# Patient Record
Sex: Female | Born: 1950 | Race: Black or African American | Hispanic: No | Marital: Married | Smoking: Never smoker
Health system: Southern US, Community
[De-identification: ages and names within clinical notes are randomized; demographics above are authoritative.]

## PROBLEM LIST (undated history)

## (undated) DIAGNOSIS — Z973 Presence of spectacles and contact lenses: Secondary | ICD-10-CM

## (undated) DIAGNOSIS — N201 Calculus of ureter: Secondary | ICD-10-CM

## (undated) DIAGNOSIS — I10 Essential (primary) hypertension: Secondary | ICD-10-CM

## (undated) DIAGNOSIS — G43909 Migraine, unspecified, not intractable, without status migrainosus: Secondary | ICD-10-CM

## (undated) DIAGNOSIS — T7840XA Allergy, unspecified, initial encounter: Secondary | ICD-10-CM

## (undated) DIAGNOSIS — D0510 Intraductal carcinoma in situ of unspecified breast: Secondary | ICD-10-CM

## (undated) DIAGNOSIS — N200 Calculus of kidney: Secondary | ICD-10-CM

## (undated) DIAGNOSIS — D649 Anemia, unspecified: Secondary | ICD-10-CM

## (undated) DIAGNOSIS — Z8619 Personal history of other infectious and parasitic diseases: Secondary | ICD-10-CM

## (undated) HISTORY — PX: TUBAL LIGATION: SHX77

## (undated) HISTORY — DX: Essential (primary) hypertension: I10

## (undated) HISTORY — DX: Intraductal carcinoma in situ of unspecified breast: D05.10

## (undated) HISTORY — PX: ORIF FEMUR FRACTURE: SHX2119

## (undated) HISTORY — DX: Migraine, unspecified, not intractable, without status migrainosus: G43.909

## (undated) HISTORY — DX: Calculus of kidney: N20.0

## (undated) HISTORY — DX: Presence of spectacles and contact lenses: Z97.3

## (undated) HISTORY — DX: Anemia, unspecified: D64.9

## (undated) HISTORY — DX: Allergy, unspecified, initial encounter: T78.40XA

## (undated) HISTORY — PX: TONSILLECTOMY: SUR1361

---

## 1998-05-31 HISTORY — PX: BUNIONECTOMY: SHX129

## 2002-05-22 ENCOUNTER — Encounter: Payer: Self-pay | Admitting: Emergency Medicine

## 2002-05-22 ENCOUNTER — Emergency Department (HOSPITAL_COMMUNITY): Admission: EM | Admit: 2002-05-22 | Discharge: 2002-05-22 | Payer: Self-pay | Admitting: Emergency Medicine

## 2004-12-30 ENCOUNTER — Other Ambulatory Visit: Admission: RE | Admit: 2004-12-30 | Discharge: 2004-12-30 | Payer: Self-pay | Admitting: Obstetrics and Gynecology

## 2005-01-20 ENCOUNTER — Ambulatory Visit: Payer: Self-pay | Admitting: Internal Medicine

## 2005-01-25 ENCOUNTER — Ambulatory Visit: Payer: Self-pay | Admitting: Internal Medicine

## 2005-02-18 ENCOUNTER — Ambulatory Visit: Payer: Self-pay | Admitting: Internal Medicine

## 2005-02-22 ENCOUNTER — Ambulatory Visit: Payer: Self-pay | Admitting: Internal Medicine

## 2005-02-24 ENCOUNTER — Ambulatory Visit: Payer: Self-pay | Admitting: Internal Medicine

## 2013-06-15 ENCOUNTER — Other Ambulatory Visit: Payer: Self-pay | Admitting: Medical

## 2013-06-15 ENCOUNTER — Telehealth: Payer: Self-pay | Admitting: Medical

## 2013-06-15 ENCOUNTER — Ambulatory Visit (INDEPENDENT_AMBULATORY_CARE_PROVIDER_SITE_OTHER): Payer: No Typology Code available for payment source | Admitting: Medical

## 2013-06-15 ENCOUNTER — Encounter: Payer: Self-pay | Admitting: Medical

## 2013-06-15 VITALS — BP 140/78 | HR 78 | Temp 97.7°F | Resp 16 | Ht 62.5 in | Wt 113.0 lb

## 2013-06-15 DIAGNOSIS — Z1239 Encounter for other screening for malignant neoplasm of breast: Secondary | ICD-10-CM

## 2013-06-15 DIAGNOSIS — R030 Elevated blood-pressure reading, without diagnosis of hypertension: Secondary | ICD-10-CM

## 2013-06-15 DIAGNOSIS — Z13820 Encounter for screening for osteoporosis: Secondary | ICD-10-CM

## 2013-06-15 DIAGNOSIS — Z23 Encounter for immunization: Secondary | ICD-10-CM

## 2013-06-15 DIAGNOSIS — Z1231 Encounter for screening mammogram for malignant neoplasm of breast: Secondary | ICD-10-CM

## 2013-06-15 DIAGNOSIS — Z1211 Encounter for screening for malignant neoplasm of colon: Secondary | ICD-10-CM

## 2013-06-15 DIAGNOSIS — Z0000 Encounter for general adult medical examination without abnormal findings: Secondary | ICD-10-CM

## 2013-06-15 LAB — POCT URINALYSIS DIPSTICK
Bilirubin, UA: NEGATIVE
Blood, UA: NEGATIVE
Glucose, UA: NEGATIVE
Ketones, UA: NEGATIVE
Leukocytes, UA: NEGATIVE
Nitrite, UA: NEGATIVE
Protein, UA: NEGATIVE
Spec Grav, UA: <=1.005
Urobilinogen, UA: NEGATIVE
pH, UA: 7

## 2013-06-15 LAB — CBC
HCT: 40.5 % (ref 36.0–46.0)
Hemoglobin: 14 g/dL (ref 12.0–15.0)
MCH: 30.7 pg (ref 26.0–34.0)
MCHC: 34.6 g/dL (ref 30.0–36.0)
MCV: 88.8 fL (ref 78.0–100.0)
Platelets: 273 10*3/uL (ref 150–400)
RBC: 4.56 MIL/uL (ref 3.87–5.11)
RDW: 13 % (ref 11.5–15.5)
WBC: 7.6 10*3/uL (ref 4.0–10.5)

## 2013-06-15 MED ORDER — ATORVASTATIN CALCIUM 40 MG PO TABS: 40.0000 mg | ORAL_TABLET | Freq: Every day | ORAL | Status: DC

## 2013-06-15 NOTE — Progress Notes (Signed)
Subjective:   HPI  Alison Zimmerman is a 63 y.o. female who presents for a complete physical.  Was formerly a patient here 10 years ago.   Hasn't had any recent preventative care.     Preventative care: Last ophthalmology= [facility]- NOT SEEN THERE IN A WHILE Last dental visit:YES DR. Terence Lux Last colonoscopy:NEVER Last mammogram:NOT IN YEARS SINCE MAYBE 2000 Last gynecological exam:YEARS AGO Last EKG:? Last labs:?  Prior vaccinations: TD or Tdap: MAYBE 1999 Influenza: no Pneumococcal:N/A Shingles/Zostavax:N/A Other: NO  Advanced directive:N/A Health care power of attorney:N/A Living will:N/A  Concerns:NONE   Reviewed their medical, surgical, family, social, medication, and allergy history and updated chart as appropriate.  Past Medical History  Diagnosis Date  . Skin complaints     sees Dr. Allyson Sabal  . History of kidney stones     Dr. Gaynelle Arabian, [facility]  . Wears glasses   . Migraine   . H/O bone density study     remote past  . History of mammogram     remote past    Past Surgical History  Procedure Laterality Date  . Tubal ligation    . Bunionectomy  2000    Dr. Gwyneth Revels, right  . Colonoscopy  1/15    never    History   Social History  . Marital Status: Married    Spouse Name: N/A    Number of Children: N/A  . Years of Education: N/A   Occupational History  . Not on file.   Social History Main Topics  . Smoking status: Never Smoker   . Smokeless tobacco: Not on file  . Alcohol Use: No  . Drug Use: No  . Sexual Activity: Not on file   Other Topics Concern  . Not on file   Social History Narrative   Married, has 2 daughters, 3 children.  Exercise - low impact water aerobics, retired, use to work for [employer];      Family History  Problem Relation Age of Onset  . Stroke Mother   . Stroke Father   . Cancer Neg Hx   . Diabetes Neg Hx   . Heart disease Neg Hx   . Hypertension Neg Hx     No current outpatient prescriptions on  file.  No Known Allergies    Review of Systems Constitutional: -fever, -chills, -sweats, -unexpected weight change, -decreased appetite, -fatigue Allergy: -sneezing, -itching, -congestion Dermatology: -changing moles, --rash, -lumps ENT: -runny nose, -ear pain, -sore throat, -hoarseness, -sinus pain, -teeth pain, - ringing in ears, -hearing loss, -nosebleeds Cardiology: -chest pain, -palpitations, -swelling, -difficulty breathing when lying flat, -waking up short of breath Respiratory: -cough, -shortness of breath, -difficulty breathing with exercise or exertion, -wheezing, -coughing up blood Gastroenterology: -abdominal pain, -nausea, -vomiting, -diarrhea, -constipation, -blood in stool, -changes in bowel movement, -difficulty swallowing or eating Hematology: -bleeding, -bruising  Musculoskeletal: -joint aches, -muscle aches, -joint swelling, -back pain, -neck pain, -cramping, -changes in gait Ophthalmology: denies vision changes, eye redness, itching, discharge Urology: -burning with urination, -difficulty urinating, -blood in urine, -urinary frequency, -urgency, -incontinence Neurology: -headache, -weakness, -tingling, -numbness, -memory loss, -falls, -dizziness Psychology: -depressed mood, -agitation, -sleep problems     Objective:   Physical Exam  BP 140/78  Pulse 78  Temp(Src) 97.7 F (36.5 C) (Oral)  Resp 16  Ht 5' 2.5" (1.588 m)  Wt 113 lb (51.256 kg)  BMI 20.33 kg/m2  General appearance: alert, no distress, WD/WN, lean AA female Skin: few scattered benign flat lesions, no worrisome lesions,  right jaw with unchanged benign 31mm round raised papule, small 59mm round papule of right nose distallly, benign HEENT: normocephalic, conjunctiva/corneas normal, sclerae anicteric, PERRLA, EOMi, nares patent, no discharge or erythema, pharynx normal Oral cavity: MMM, tongue normal, teeth in good repair Neck: supple, no lymphadenopathy, no thyromegaly, no masses, normal ROM, no  bruits Chest: non tender, normal shape and expansion Heart: RRR, normal S1, S2, no murmurs Lungs: CTA bilaterally, no wheezes, rhonchi, or rales Abdomen: +bs, soft, non tender, non distended, no masses, no hepatomegaly, no splenomegaly, no bruits Back: non tender, normal ROM, no scoliosis Musculoskeletal: upper extremities non tender, no obvious deformity, normal ROM throughout, lower extremities non tender, no obvious deformity, normal ROM throughout Extremities: no edema, no cyanosis, no clubbing Pulses: 2+ symmetric, upper and lower extremities, normal cap refill Neurological: alert, oriented x 3, CN2-12 intact, strength normal upper extremities and lower extremities, sensation normal throughout, DTRs 2+ throughout, no cerebellar signs, gait normal Psychiatric: normal affect, behavior normal, pleasant  Breast/gyn/rectal - deferred today, will return for this   Assessment and Plan :    Encounter Diagnoses  Name Primary?  . Routine general medical examination at a health care facility Yes  . Need for Tdap vaccination   . Need for shingles vaccine   . Special screening for malignant neoplasms, colon   . Screening for breast cancer   . Elevated blood pressure reading without diagnosis of hypertension   . Screening for osteoporosis     Physical exam - discussed healthy lifestyle, diet, exercise, preventative care, vaccinations, and addressed their concerns.  Handout given. Counseled on the Tdap (tetanus, diptheria, and acellular pertussis) vaccine.  Vaccine information sheet given. Tdap vaccine given after consent obtained.  Patient recommendations printed as below: Please check your insurance coverage for the shingles/Zostavax vaccine as well as the pneumococcal vaccine  We are referring you to gastroenterology for your first screening colonoscopy   Please return at your convenience for breast and pelvic exam, Pap smear.   We are referring you for a screening mammogram.  We  updated your Tdap vaccine today.  Make sure you are getting 1200 mg of calcium daily through supplements or dairy products over-the-counter, 600 international units of vitamin D daily.  I recommend you take aspirin 81 mg at bedtime for heart disease/stroke prevention given your family history of stroke.  Continue routine exercise.  See your eye doctor and dentist yearly.  She will either bring back her recent insurance labs today or let us know so we can send labs.   Follow-up pending studies.

## 2013-06-15 NOTE — Patient Instructions (Signed)
Please check your insurance coverage for the shingles/Zostavax vaccine as well as the pneumococcal vaccine  We are referring you to gastroenterology for your first screening colonoscopy   Please return at your convenience for breast and pelvic exam, Pap smear.   We are referring you for a screening mammogram.  We updated your Tdap vaccine today.  Make sure you are getting 1200 mg of calcium daily through supplements or dairy products over-the-counter, 600 international units of vitamin D daily.  I recommend you take aspirin 81 mg at bedtime for heart disease/stroke prevention given your family history of stroke.  Continue routine exercise.  See your eye doctor and dentist yearly.   Local eye doctors:  Dr. Webb Laws [place], [place], [place] [address] [phone]   [facility] Dr. Camillo Flaming [address] [place], [place] [address]  [email or web address]   Fabio Pierce, M.D. Corena Herter, O.D. [place], [place], [place] [address] Medical telephone: [phone] Optical telephone: [phone]

## 2013-06-15 NOTE — Addendum Note (Signed)
Addended by: Carlena Hurl on: 06/15/2013 01:23 PM   Modules accepted: Orders

## 2013-06-15 NOTE — Telephone Encounter (Signed)
Please tell her thank you for bringing the lab results by. I will send some additional bloodwork out for a blood count and vitamin D test.  Her labs were okay except for her cholesterol which is quite high. Her LDL bad cholesterol and total cholesterol were quite high, so I would like her to begin Lipitor for cholesterol at bedtime once daily along with 81 mg baby aspirin daily for heart health and to lower cholesterol.  Since she already eats healthy and exercises, is a normal weight, that is why her recommend we go ahead and start the medication.  Plan to have her come back in 3 months to recheck on cholesterol.

## 2013-06-15 NOTE — Telephone Encounter (Signed)
Patient is aware of Dorothea Ogle PA_c message and recommendations. CLS

## 2013-06-16 LAB — VITAMIN D 25 HYDROXY (VIT D DEFICIENCY, FRACTURES): Vit D, 25-Hydroxy: 28 ng/mL — ABNORMAL LOW (ref 30–89)

## 2013-06-18 ENCOUNTER — Other Ambulatory Visit: Payer: Self-pay | Admitting: Medical

## 2013-06-18 MED ORDER — VITAMIN D (ERGOCALCIFEROL) 1.25 MG (50000 UNIT) PO CAPS: 50000.0000 [IU] | ORAL_CAPSULE | ORAL | Status: DC

## 2013-06-29 ENCOUNTER — Encounter: Payer: Self-pay | Admitting: Medical

## 2013-07-04 ENCOUNTER — Ambulatory Visit
Admission: RE | Admit: 2013-07-04 | Discharge: 2013-07-04 | Disposition: A | Discharge status: Home / Self Care | Payer: No Typology Code available for payment source | Source: Ambulatory Visit | Attending: Medical | Admitting: Medical

## 2013-07-04 DIAGNOSIS — Z1231 Encounter for screening mammogram for malignant neoplasm of breast: Secondary | ICD-10-CM

## 2013-07-06 ENCOUNTER — Other Ambulatory Visit: Payer: Self-pay | Admitting: Medical

## 2013-07-06 DIAGNOSIS — R928 Other abnormal and inconclusive findings on diagnostic imaging of breast: Secondary | ICD-10-CM

## 2013-07-26 ENCOUNTER — Telehealth: Payer: Self-pay | Admitting: Internal Medicine

## 2013-07-26 NOTE — Telephone Encounter (Signed)
Faxed over medical records to [facility] processing center @ [phone]

## 2013-08-07 ENCOUNTER — Other Ambulatory Visit: Payer: Self-pay | Admitting: Medical

## 2013-08-07 ENCOUNTER — Ambulatory Visit
Admission: RE | Admit: 2013-08-07 | Discharge: 2013-08-07 | Disposition: A | Discharge status: Home / Self Care | Payer: Self-pay | Source: Ambulatory Visit | Attending: Medical | Admitting: Medical

## 2013-08-07 DIAGNOSIS — R928 Other abnormal and inconclusive findings on diagnostic imaging of breast: Secondary | ICD-10-CM

## 2013-08-07 DIAGNOSIS — R921 Mammographic calcification found on diagnostic imaging of breast: Secondary | ICD-10-CM

## 2013-08-17 ENCOUNTER — Ambulatory Visit
Admission: RE | Admit: 2013-08-17 | Discharge: 2013-08-17 | Disposition: A | Discharge status: Home / Self Care | Payer: Self-pay | Source: Ambulatory Visit | Attending: Medical | Admitting: Medical

## 2013-08-17 DIAGNOSIS — R921 Mammographic calcification found on diagnostic imaging of breast: Secondary | ICD-10-CM

## 2013-08-20 ENCOUNTER — Other Ambulatory Visit: Payer: Self-pay | Admitting: Medical

## 2013-08-20 DIAGNOSIS — C50919 Malignant neoplasm of unspecified site of unspecified female breast: Secondary | ICD-10-CM

## 2013-08-21 ENCOUNTER — Encounter: Payer: Self-pay | Admitting: Internal Medicine

## 2013-08-23 ENCOUNTER — Telehealth: Payer: Self-pay

## 2013-08-23 ENCOUNTER — Telehealth: Payer: Self-pay | Admitting: *Deleted

## 2013-08-23 DIAGNOSIS — C50411 Malignant neoplasm of upper-outer quadrant of right female breast: Secondary | ICD-10-CM

## 2013-08-23 NOTE — Telephone Encounter (Signed)
error 

## 2013-08-23 NOTE — Telephone Encounter (Signed)
Confirmed [facility] for 08/29/13 at 12N .  Instructions and contact information given.

## 2013-08-23 NOTE — Progress Notes (Signed)
Spoke with patient has appt with oncology 08/29/13. [facility]

## 2013-08-24 ENCOUNTER — Encounter: Payer: Self-pay | Admitting: Internal Medicine

## 2013-08-27 ENCOUNTER — Ambulatory Visit
Admission: RE | Admit: 2013-08-27 | Discharge: 2013-08-27 | Disposition: A | Discharge status: Home / Self Care | Payer: No Typology Code available for payment source | Source: Ambulatory Visit | Attending: Medical | Admitting: Medical

## 2013-08-27 DIAGNOSIS — C50919 Malignant neoplasm of unspecified site of unspecified female breast: Secondary | ICD-10-CM

## 2013-08-27 MED ORDER — GADOBENATE DIMEGLUMINE 529 MG/ML IV SOLN
10.0000 mL | Freq: Once | INTRAVENOUS | Status: AC | PRN
  Administered 2013-08-27: 10 mL via INTRAVENOUS

## 2013-08-28 ENCOUNTER — Other Ambulatory Visit: Payer: Self-pay | Admitting: Medical

## 2013-08-28 DIAGNOSIS — R928 Other abnormal and inconclusive findings on diagnostic imaging of breast: Secondary | ICD-10-CM

## 2013-08-29 ENCOUNTER — Ambulatory Visit: Payer: No Typology Code available for payment source | Attending: Surgery | Admitting: Physical Therapy

## 2013-08-29 ENCOUNTER — Ambulatory Visit (HOSPITAL_BASED_OUTPATIENT_CLINIC_OR_DEPARTMENT_OTHER): Payer: No Typology Code available for payment source | Admitting: Oncology

## 2013-08-29 ENCOUNTER — Ambulatory Visit: Payer: No Typology Code available for payment source

## 2013-08-29 ENCOUNTER — Encounter (INDEPENDENT_AMBULATORY_CARE_PROVIDER_SITE_OTHER): Payer: Self-pay | Admitting: Surgery

## 2013-08-29 ENCOUNTER — Encounter: Payer: Self-pay | Admitting: Oncology

## 2013-08-29 ENCOUNTER — Ambulatory Visit (HOSPITAL_BASED_OUTPATIENT_CLINIC_OR_DEPARTMENT_OTHER): Payer: No Typology Code available for payment source | Admitting: Surgery

## 2013-08-29 ENCOUNTER — Other Ambulatory Visit (HOSPITAL_BASED_OUTPATIENT_CLINIC_OR_DEPARTMENT_OTHER): Payer: No Typology Code available for payment source

## 2013-08-29 ENCOUNTER — Ambulatory Visit
Admission: RE | Admit: 2013-08-29 | Discharge: 2013-08-29 | Disposition: A | Discharge status: Home / Self Care | Payer: No Typology Code available for payment source | Source: Ambulatory Visit | Attending: Radiation Oncology | Admitting: Radiation Oncology

## 2013-08-29 VITALS — BP 155/88 | HR 86 | Temp 97.8°F | Resp 18 | Ht 62.5 in | Wt 112.5 lb

## 2013-08-29 DIAGNOSIS — D0511 Intraductal carcinoma in situ of right breast: Secondary | ICD-10-CM

## 2013-08-29 DIAGNOSIS — C50411 Malignant neoplasm of upper-outer quadrant of right female breast: Secondary | ICD-10-CM

## 2013-08-29 DIAGNOSIS — Z170 Estrogen receptor positive status [ER+]: Secondary | ICD-10-CM

## 2013-08-29 DIAGNOSIS — C50419 Malignant neoplasm of upper-outer quadrant of unspecified female breast: Secondary | ICD-10-CM

## 2013-08-29 DIAGNOSIS — D0590 Unspecified type of carcinoma in situ of unspecified breast: Secondary | ICD-10-CM

## 2013-08-29 LAB — COMPREHENSIVE METABOLIC PANEL (CC13)
ALT: 16 U/L (ref 0–55)
AST: 21 U/L (ref 5–34)
Albumin: 4.3 g/dL (ref 3.5–5.0)
Alkaline Phosphatase: 86 U/L (ref 40–150)
Anion Gap: 11 mEq/L (ref 3–11)
BUN: 17.4 mg/dL (ref 7.0–26.0)
CALCIUM: 9.9 mg/dL (ref 8.4–10.4)
CHLORIDE: 105 meq/L (ref 98–109)
CO2: 26 mEq/L (ref 22–29)
CREATININE: 0.8 mg/dL (ref 0.6–1.1)
GLUCOSE: 102 mg/dL (ref 70–140)
POTASSIUM: 3.8 meq/L (ref 3.5–5.1)
Sodium: 142 mEq/L (ref 136–145)
Total Bilirubin: 0.55 mg/dL (ref 0.20–1.20)
Total Protein: 8 g/dL (ref 6.4–8.3)

## 2013-08-29 LAB — CBC WITH DIFFERENTIAL/PLATELET
BASO%: 0.5 % (ref 0.0–2.0)
BASOS ABS: 0 10*3/uL (ref 0.0–0.1)
EOS%: 0.4 % (ref 0.0–7.0)
Eosinophils Absolute: 0 10*3/uL (ref 0.0–0.5)
HEMATOCRIT: 42.1 % (ref 34.8–46.6)
HEMOGLOBIN: 13.7 g/dL (ref 11.6–15.9)
LYMPH#: 1.7 10*3/uL (ref 0.9–3.3)
LYMPH%: 21.7 % (ref 14.0–49.7)
MCH: 30.3 pg (ref 25.1–34.0)
MCHC: 32.4 g/dL (ref 31.5–36.0)
MCV: 93.3 fL (ref 79.5–101.0)
MONO#: 0.4 10*3/uL (ref 0.1–0.9)
MONO%: 5.5 % (ref 0.0–14.0)
NEUT%: 71.9 % (ref 38.4–76.8)
NEUTROS ABS: 5.5 10*3/uL (ref 1.5–6.5)
Platelets: 273 10*3/uL (ref 145–400)
RBC: 4.51 10*6/uL (ref 3.70–5.45)
RDW: 12.5 % (ref 11.2–14.5)
WBC: 7.6 10*3/uL (ref 3.9–10.3)

## 2013-08-29 NOTE — Patient Instructions (Signed)
Lumpectomy A lumpectomy is a form of "breast conserving" or "breast preservation" surgery. It may also be referred to as a partial mastectomy. During a lumpectomy, the portion of the breast that contains the cancerous tumor or breast mass (the lump) is removed. Some normal tissue around the lump may also be removed to make sure all the tumor has been removed. This surgery should take 40 minutes or less. LET YOUR HEALTH CARE PROVIDER KNOW ABOUT:  Any allergies you have.  All medicines you are taking, including vitamins, herbs, eye drops, creams, and over-the-counter medicines.  Previous problems you or members of your family have had with the use of anesthetics.  Any blood disorders you have.  Previous surgeries you have had.  Medical conditions you have. RISKS AND COMPLICATIONS Generally, this is a safe procedure. However, as with any procedure, complications can occur. Possible complications include:  Bleeding.  Infection.  Pain.  Temporary swelling.  Change in the shape of the breast, particularly if a large portion is removed. BEFORE THE PROCEDURE  Ask your health care provider about changing or stopping your regular medicines.  Do not eat or drink anything for 7 8 hours before the surgery or as directed by your health care provider. Ask your health care provider if you can take a sip of water with any approved medicines.  On the day of surgery, your healthcare provider will use a mammogram or ultrasound to locate and mark the tumor in your breast. These markings on your breast will show where the cut (incision) will be made. PROCEDURE   An IV tube will be put into one of your veins.  You may be given medicine to help you relax before the surgery (sedative). You will be given one of the following:  A medicine that numbs the area (local anesthesia).  A medicine that makes you go to sleep (general anesthesia).  Your health care provider will use a kind of electric scalpel  that uses heat to minimize bleeding (electrocautery knife).  A curved incision (like a smile or frown) that follows the natural curve of your breast is made, to allow for minimal scarring and better healing.  The tumor will be removed with some of the surrounding tissue. This will be sent to the lab for analysis. Your health care provider may also remove your lymph nodes at this time if needed.  Sometimes, but not always, a rubber tube called a drain will be surgically inserted into your breast area or armpit to collect excess fluid that may accumulate in the space where the tumor was. This drain is connected to a plastic bulb on the outside of your body. This drain creates suction to help remove the fluid.  The incisions will be closed with stitches (sutures).  A bandage may be placed over the incisions. AFTER THE PROCEDURE  You will be taken to the recovery area.  You will be given medicine for pain.  A small rubber drain may be placed in the breast for 2 3 days to prevent a collection of blood (hematoma) from developing in the breast. You will be given instructions on caring for the drain before you go home.  A pressure bandage (dressing) will be applied for 1 2 days to prevent bleeding. Ask your health care provider how to care for your bandage at home. Document Released: 06/28/2006 Document Revised: 01/17/2013 Document Reviewed: 10/20/2012 Exit[employer] Patient Information 2014 [employer].  

## 2013-08-29 NOTE — Progress Notes (Signed)
Checked in new patient with no financial issues. She has appt card and I gave her the [facility] packet.

## 2013-08-29 NOTE — Progress Notes (Signed)
Patient ID: Alison Zimmerman, female   DOB: 03/11/1951, 63 y.o.   MRN: [id number]  No chief complaint on file.   HPI Alison Zimmerman is a 63 y.o. female.  Pt presents at the request of Dr Redmond School for right breast calcifications detected on mammogram.  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  Second area noted on MRI scheduled for second look MRI next week.  No other complaints or symptoms. HPI  Past Medical History  Diagnosis Date  . Skin complaints     sees Dr. Allyson Sabal  . History of kidney stones     Dr. Gaynelle Arabian, [facility]  . Wears glasses   . Migraine   . H/O bone density study     remote past  . History of mammogram     remote past  . 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 Anxiety     Past Surgical History  Procedure Laterality Date  . Tubal ligation    . Bunionectomy  2000    Dr. Gwyneth Revels, right  . Colonoscopy  1/15    never    Family History  Problem Relation Age of Onset  . Stroke Mother   . Stroke Father   . Cancer Neg Hx   . Diabetes Neg Hx   . Heart disease Neg Hx   . Hypertension Neg Hx     Social History History  Substance Use Topics  . Smoking status: Never Smoker   . Smokeless tobacco: Not on file  . Alcohol Use: No    No Known Allergies  Current Outpatient Prescriptions  Medication Sig Dispense Refill  . phenyltoloxamine-acetaminophen 30-325 MG per tablet Take 2 tablets by mouth every 4 (four) hours as needed for pain.      . Vitamin D, Ergocalciferol, (DRISDOL) 50000 UNITS CAPS capsule Take 1 capsule (50,000 Units total) by mouth every 7 (seven) days.  4.5 capsule  3   No current facility-administered medications for this visit.    Review of Systems Review of Systems  Constitutional: Negative for fever, chills and unexpected weight change.  HENT: Negative for congestion, hearing loss, sore throat, trouble swallowing and voice change.   Eyes: Negative for  visual disturbance.  Respiratory: Negative for cough and wheezing.   Cardiovascular: Negative for chest pain, palpitations and leg swelling.  Gastrointestinal: Negative for nausea, vomiting, abdominal pain, diarrhea, constipation, blood in stool, abdominal distention and anal bleeding.  Genitourinary: Negative for hematuria, vaginal bleeding and difficulty urinating.  Musculoskeletal: Negative for arthralgias.  Skin: Negative for rash and wound.  Neurological: Negative for seizures, syncope and headaches.  Hematological: Negative for adenopathy. Does not bruise/bleed easily.  Psychiatric/Behavioral: Negative for confusion.    There were no vitals taken for this visit.  Physical Exam Physical Exam  Constitutional: She is oriented to person, place, and time. She appears well-developed and well-nourished.  HENT:  Head: Normocephalic and atraumatic.  Eyes: Pupils are equal, round, and reactive to light. No scleral icterus.  Neck: Normal range of motion. Neck supple.  Cardiovascular: Normal rate and regular rhythm.   Pulmonary/Chest: Effort normal and breath sounds normal. Right breast exhibits no inverted nipple, no mass, no nipple discharge, no skin change and no tenderness. Left breast exhibits no inverted nipple, no mass, no nipple discharge, no skin change and no tenderness. Breasts are symmetrical.  Musculoskeletal: Normal range of motion.  Lymphadenopathy:    She  has no cervical adenopathy.  Neurological: She is alert and oriented to person, place, and time.  Skin: Skin is warm and dry.  Psychiatric: She has a normal mood and affect. Her behavior is normal. Judgment and thought content normal.    Data Reviewed CLINICAL DATA: Recent diagnosis of right breast cancer.  LABS: BUN and creatinine were obtained on site at [facility] at  [address]  Results: BUN 15 mg/dL, Creatinine 0.8 mg/dL, GFR 72.  EXAM:  BILATERAL BREAST MRI WITH AND WITHOUT CONTRAST   TECHNIQUE:  Multiplanar, multisequence MR images of both breasts were obtained  prior to and following the intravenous administration of 71ml of  MultiHance.  THREE-DIMENSIONAL MR IMAGE RENDERING ON INDEPENDENT WORKSTATION:  Three-dimensional MR images were rendered by post-processing of the  original MR data on an independent workstation. The  three-dimensional MR images were interpreted, and findings are  reported in the following complete MRI report for this study. Three  dimensional images were evaluated at the independent DynaCad  workstation  COMPARISON: Previous exams  FINDINGS:  Breast composition: c: Heterogeneous fibroglandular tissue  Background parenchymal enhancement: Minimal  Right breast: Post biopsy cavity and clips are identified in the  right breast 12 o'clock position. There is minimal enhancement  surrounding the biopsy cavity probably post biopsy changes. There is  9 x 5 x 5 mm irregular focal enhancement with plateau enhancement  kinetics at the right breast 5:30 o'clock to 6 o'clock posterior 1/3  right breast.  Left breast: No mass or abnormal enhancement.  Lymph nodes: No abnormal appearing lymph nodes.  Ancillary findings: None.  IMPRESSION:  Suspicious findings. Post biopsy cavity at the right breast 12  o'clock correlating to site of recent biopsy. There is a 9 x 5 x 5  mm irregular focus at the right breast 5:30 o'clock to 6 o'clock  posterior 1/3.  RECOMMENDATION:  Second-look ultrasound with biopsy of the right breast 5:30 to 6  o'clock lesion. If this is not found on ultrasound, an MRI guided  core biopsy is recommended.  BI-RADS CATEGORY 4: Suspicious abnormality - biopsy should be  considered.  Electronically Signed  By: Abelardo Diesel M.D.   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  Once  The area is better defined decide on surgery. Looks like possible lumpectomy  candidate at this time.         Rohaan Durnil A. 08/29/2013, 2:16 PM

## 2013-08-29 NOTE — Progress Notes (Signed)
Radiation Oncology         [phone]) [phone] ________________________________  Initial outpatient Consultation - Date: 08/29/2013   Name: Alison Zimmerman MRN: [id number]   DOB: September 14, 1950  REFERRING PHYSICIAN: Turner Daniels., MD  STAGE: Breast cancer of upper-outer quadrant of right female breast   Primary site: Breast (Right)   Staging method: AJCC 7th Edition   Clinical: Stage 0 (Tis (DCIS), N0, cM0)   Summary: Stage 0 (Tis (DCIS), N0, cM0)   Clinical comments: Staged at breast conference 08/29/13.   HISTORY OF PRESENT ILLNESS::Alison Zimmerman is a 63 y.o. female  who was found to have calcifications in the right breast on a screening mammogram. This measured 7 mm. 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 MRI of the bilateral breasts showed post biopsy change in the area of concern with a 9 mm mass in the 6:00 position. Second look ultrasound is recommended. She is done well since her biopsy. She has some palpable in new changes there. He is accompanied by her husband today. She is postmenopausal. She has not used hormone replacement. She is GX P2 with her first live birth at 23. She had menses at 16. She is referred to me in by Dr. 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    PAST SURGICAL HISTORY: Past Surgical History  Procedure Laterality Date  . Tubal ligation    . Bunionectomy  2000    Dr. Gwyneth Revels, right  . Colonoscopy  1/15    never    FAMILY HISTORY:  Family History  Problem Relation Age of Onset  . Stroke Mother   . Stroke Father   . Cancer Neg Hx   . Diabetes Neg Hx   . Heart disease Neg Hx    . Hypertension Neg Hx     SOCIAL HISTORY:  History  Substance Use Topics  . Smoking status: Never Smoker   . Smokeless tobacco: Not on file  . Alcohol Use: No    ALLERGIES: Review of patient's allergies indicates no known allergies.  MEDICATIONS:  Current Outpatient Prescriptions  Medication Sig Dispense Refill  . phenyltoloxamine-acetaminophen 30-325 MG per tablet Take 2 tablets by mouth every 4 (four) hours as needed for pain.      . Vitamin D, Ergocalciferol, (DRISDOL) 50000 UNITS CAPS capsule Take 1 capsule (50,000 Units total) by mouth every 7 (seven) days.  4.5 capsule  3   No current facility-administered medications for this encounter.    REVIEW OF SYSTEMS:  A 15 point review of systems is documented in the electronic medical record. This was obtained by the nursing staff. However, I reviewed this with the patient to discuss relevant findings and make appropriate changes.  Pertinent items are noted in HPI.  PHYSICAL EXAM: There were no vitals filed for this visit.. . Pleasant female who appears her stated age sitting comfortably on examining table. No palpable supraclavicular or cervical adenopathy. No palpable axillary adenopathy bilaterally. No palpable abnormalities of the left breast. In the upper outer quadrant of the right breast there is biopsy change and some bruising. No other palpable abnormalities. She is alert and oriented x3.  LABORATORY DATA:  Lab Results  Component Value Date   WBC 7.6 08/29/2013   HGB 13.7 08/29/2013   HCT 42.1 08/29/2013   MCV 93.3 08/29/2013   PLT 273 08/29/2013   Lab Results  Component Value Date   NA 142 08/29/2013   K 3.8 08/29/2013   CO2 26 08/29/2013   Lab Results  Component Value Date   ALT 16 08/29/2013   AST 21 08/29/2013   ALKPHOS 86 08/29/2013   BILITOT 0.55 08/29/2013     RADIOGRAPHY: Mr Breast Bilateral W Wo Contrast  08/27/2013   CLINICAL DATA:  Recent diagnosis of right breast cancer.  LABS:  BUN and creatinine were obtained on  site at [facility] at  [address]  Results:  BUN 15 mg/dL,  Creatinine 0.8 mg/dL,  GFR 72.  EXAM: BILATERAL BREAST MRI WITH AND WITHOUT CONTRAST  TECHNIQUE: Multiplanar, multisequence MR images of both breasts were obtained prior to and following the intravenous administration of 37m of MultiHance.  THREE-DIMENSIONAL MR IMAGE RENDERING ON INDEPENDENT WORKSTATION:  Three-dimensional MR images were rendered by post-processing of the original MR data on an independent workstation. The three-dimensional MR images were interpreted, and findings are reported in the following complete MRI report for this study. Three dimensional images were evaluated at the independent DynaCad workstation  COMPARISON:  Previous exams  FINDINGS: Breast composition: c:  Heterogeneous fibroglandular tissue  Background parenchymal enhancement: Minimal  Right breast: Post biopsy cavity and clips are identified in the right breast 12 o'clock position. There is minimal enhancement surrounding the biopsy cavity probably post biopsy changes. There is 9 x 5 x 5 mm irregular focal enhancement with plateau enhancement kinetics at the right breast 5:30 o'clock to 6 o'clock posterior 1/3 right breast.  Left breast: No mass or abnormal enhancement.  Lymph nodes: No abnormal appearing lymph nodes.  Ancillary findings:  None.  IMPRESSION: Suspicious findings. Post biopsy cavity at the right breast 12 o'clock correlating to site of recent biopsy. There is a 9 x 5 x 5 mm irregular focus at the right breast 5:30 o'clock to 6 o'clock posterior 1/3.  RECOMMENDATION: Second-look ultrasound with biopsy of the right breast 5:30 to 6 o'clock lesion. If this is not found on ultrasound, an MRI guided core biopsy is recommended.  BI-RADS CATEGORY  4: Suspicious abnormality - biopsy should be considered.   Electronically Signed   By: WAbelardo DieselM.D.   On: 08/27/2013 14:59   Mm Digital Diagnostic Unilat R  08/07/2013   CLINICAL DATA:  Patient  recalled from screening for right breast calcifications.  EXAM: DIGITAL DIAGNOSTIC  RIGHT MAMMOGRAM  COMPARISON:  Screening mammogram 07/04/2013  ACR Breast Density Category c: The breast tissue is heterogeneously dense, which may obscure small masses.  FINDINGS: Within the upper-outer quadrant of the right breast middle to posterior depth there is a 7 x 6 x 6 mm group of calcifications of varying sizes and shapes.  IMPRESSION: Suspicious right breast calcifications.  RECOMMENDATION: Stereotactic guided core needle biopsy right breast calcifications. Biopsy scheduled for Monday August 13, 2013 at 10 a.m.  I have discussed the findings and recommendations with the patient. Results were also provided in writing at the conclusion of the visit. If applicable, a reminder letter will be sent to the patient regarding the next appointment.  BI-RADS CATEGORY  4: Suspicious abnormality - biopsy should be considered.   Electronically Signed   By: DLovey NewcomerM.D.   On: 08/07/2013 12:23   Mm Rt Breast  Bx W Loc Dev 1st Lesion Image Bx Spec Stereo Guide  08/20/2013   ADDENDUM REPORT: 08/20/2013 11:33  ADDENDUM: Addendum by Dr. Nyoka Cowden on 08/20/2013. I discussed the pathology results of DCIS with the patient and her husband by telephone. This is concordant. She reports no problems at the biopsy site. Breast MRI will be scheduled. Multidisciplinary clinic has been scheduled for 08/29/2013. All questions were answered.   Electronically Signed   By: Conchita Paris M.D.   On: 08/20/2013 11:33   08/20/2013   CLINICAL DATA:  Right breast calcifications.  EXAM: RIGHT STEREOTACTIC CORE NEEDLE BIOPSY  COMPARISON:  Previous exams.  FINDINGS: The patient and I discussed the procedure of stereotactic-guided biopsy including benefits and alternatives. We discussed the high likelihood of a successful procedure. We discussed the risks of the procedure including infection, bleeding, tissue injury, clip migration, and inadequate sampling.  Informed written consent was given. The usual time out protocol was performed immediately prior to the procedure.  Using sterile technique and 2% Lidocaine as local anesthetic, under stereotactic guidance, a 9 gauge Suros device was used to perform core needle biopsy of calcifications in the upper-outer right breast using a lateral to medial approach. Specimen radiograph was performed showing calcifications. Specimens with calcifications are identified for pathology.  At the conclusion of the procedure, a coil shaped tissue marker clip was deployed into the biopsy cavity. Follow-up 2-view mammogram confirmed clip placement. The clip is approximately 6 mm medial and posterior to the biopsied calcifications.  IMPRESSION: Stereotactic-guided biopsy of right breast calcifications. No apparent complications. The clip is approximately 6 mm medial and posterior to the biopsied calcifications.  Electronically Signed: By: Donavan Burnet M.D. On: 08/17/2013 15:23      IMPRESSION: DCIS of the right breast  PLAN: I spoke to Ms. Bannerman regarding her diagnosis and options for treatment. We discussed the role of radiation and decreasing local failures in patients who undergo lumpectomy for DCIS. We discussed that there was no survival benefit to radiation after surgery. We discussed the process of simulation the placement tattoos. We discussed skin redness, darkening, eructation, and fatigue as most common side effects. We discussed the low likelihood of symptomatic lung rib or heart damage. I will plan on seeing her back after surgery. She met with surgery, medical oncology, physical therapy, and patient family support staff. She will be followed up by our breast cancer navigators. I spent 40 minutes  face to face with the patient and more than 50% of that time was spent in counseling and/or coordination of care.   ------------------------------------------------  Thea Silversmith, MD

## 2013-08-30 NOTE — Progress Notes (Signed)
[facility] Psychosocial Distress Screening Clinical Social Work  Clinical Social Work met with Patient at breast clinic.  The patient scored a no score on the Psychosocial Distress Thermometer which indicates no distress. Clinical Social Worker Intern assessed for distress and other psychosocial needs. Patient was given written information about Patient and Family support services.  Patient agrees to seek out further assistance if needed.   Clinical Social Worker follow up needed: no  If yes, follow up plan:   Jacorion Klem S. [place] Work Intern [employer] [phone]

## 2013-08-31 ENCOUNTER — Telehealth: Payer: Self-pay | Admitting: *Deleted

## 2013-08-31 ENCOUNTER — Other Ambulatory Visit: Payer: Self-pay | Admitting: Medical

## 2013-08-31 ENCOUNTER — Encounter: Payer: Self-pay | Admitting: Oncology

## 2013-08-31 DIAGNOSIS — R928 Other abnormal and inconclusive findings on diagnostic imaging of breast: Secondary | ICD-10-CM

## 2013-08-31 NOTE — Progress Notes (Signed)
Alison Zimmerman [id number] [birth year] 63 y.o. 08/31/2013 12:55 PM  CC  [name], MD [address] [place] [address] Dr. Marcello Moores Cornett Dr. Thea Silversmith  REASON FOR CONSULTATION:  63 year old female with DCIS of the right breast. Patient is seen in the multidisciplinary breast clinic for discussion of treatment options.  STAGE:   Breast cancer of upper-outer quadrant of right female breast   Primary site: Breast (Right)   Staging method: AJCC 7th Edition   Clinical: Stage 0 (Tis (DCIS), N0, cM0)   Summary: Stage 0 (Tis (DCIS), N0, cM0)  REFERRING PHYSICIAN: Dr. Marcello Moores Cornett  HISTORY OF PRESENT ILLNESS:  Alison Zimmerman is a 63 y.o. female.  Medical history significant for kidney stones migraines. Patient recently had a screening mammogram performed that revealed calcifications in the right breast measuring 7 mm. She had a biopsy performed that revealed high-grade DCIS. Prognostic markers revealed the tumor to be ER positive PR positive. MRI of the breasts showed post biopsy changes in the area of the 7 mm. She was also found to have a 9 mm mass in the 6:00 position. She was recommended another ultrasound which will be done hopefully early next week along with a biopsy. Her case was discussed in the multidisciplinary breast conference. Radiology and pathology were reviewed. She presents without any significant complaints she is accompanied by her husband.   Past Medical History: Past Medical History  Diagnosis Date  . Skin complaints     sees Dr. Allyson Sabal  . History of kidney stones     Dr. Gaynelle Arabian, [facility]  . Wears glasses   . Migraine   . H/O bone density study     remote past  . History of mammogram     remote past  . 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 Anxiety     Past Surgical History: Past Surgical History  Procedure Laterality Date  . Tubal ligation    .  Bunionectomy  2000    Dr. Gwyneth Revels, right  . Colonoscopy  1/15    never    Family History: Family History  Problem Relation Age of Onset  . Stroke Mother   . Stroke Father   . Cancer Neg Hx   . Diabetes Neg Hx   . Heart disease Neg Hx   . Hypertension Neg Hx     Social History History  Substance Use Topics  . Smoking status: Never Smoker   . Smokeless tobacco: Not on file  . Alcohol Use: No    Allergies: No Known Allergies  Current Medications: Current Outpatient Prescriptions  Medication Sig Dispense Refill  . phenyltoloxamine-acetaminophen 30-325 MG per tablet Take 2 tablets by mouth every 4 (four) hours as needed for pain.      . Vitamin D, Ergocalciferol, (DRISDOL) 50000 UNITS CAPS capsule Take 1 capsule (50,000 Units total) by mouth every 7 (seven) days.  4.5 capsule  3   No current facility-administered medications for this visit.    OB/GYN History: Menarche at age 77 she is postmenopausal no history of hormone replacement use. She is GX P2 first live birth at 20.  Fertility Discussion: Not applicable Prior History of Cancer: No  Health Maintenance:  Colonoscopy no Bone Density no Last PAP smear 20 years ago  ECOG PERFORMANCE STATUS: 0 - Asymptomatic  Genetic Counseling/testing: no  REVIEW OF SYSTEMS:  A comprehensive review of systems was negative.  PHYSICAL EXAMINATION: Blood pressure  155/88, pulse 86, temperature 97.8 F (36.6 C), temperature source Oral, resp. rate 18, height 5' 2.5" (1.588 m), weight 112 lb 8 oz (51.03 kg).  General:  well-nourished in no acute distress.  Eyes:  no scleral icterus.  ENT:  There were no oropharyngeal lesions.  Neck was without thyromegaly.  Lymphatics:  Negative cervical, supraclavicular or axillary adenopathy.  Respiratory: lungs were clear bilaterally without wheezing or crackles.  Cardiovascular:  Regular rate and rhythm, S1/S2, without murmur, rub or gallop.  There was no pedal edema.  GI:  abdomen was soft,  flat, nontender, nondistended, without organomegaly.  Muscoloskeletal:  no spinal tenderness of palpation of vertebral spine.  Skin exam was without echymosis, petichae.  Neuro exam was nonfocal.  Patient was able to get on and off exam table without assistance.  Gait was normal.  Patient was alerted and oriented.  Attention was good.   Language was appropriate.  Mood was normal without depression.  Speech was not pressured.  Thought content was not tangential.   Breasts: right breast normal without mass, skin or nipple changes or axillary nodes, left breast normal without mass, skin or nipple changes or axillary nodes.   STUDIES/RESULTS: Mr Breast Bilateral W Wo Contrast  08/27/2013   CLINICAL DATA:  Recent diagnosis of right breast cancer.  LABS:  BUN and creatinine were obtained on site at [facility] at  [address]  Results:  BUN 15 mg/dL,  Creatinine 0.8 mg/dL,  GFR 72.  EXAM: BILATERAL BREAST MRI WITH AND WITHOUT CONTRAST  TECHNIQUE: Multiplanar, multisequence MR images of both breasts were obtained prior to and following the intravenous administration of 72ml of MultiHance.  THREE-DIMENSIONAL MR IMAGE RENDERING ON INDEPENDENT WORKSTATION:  Three-dimensional MR images were rendered by post-processing of the original MR data on an independent workstation. The three-dimensional MR images were interpreted, and findings are reported in the following complete MRI report for this study. Three dimensional images were evaluated at the independent DynaCad workstation  COMPARISON:  Previous exams  FINDINGS: Breast composition: c:  Heterogeneous fibroglandular tissue  Background parenchymal enhancement: Minimal  Right breast: Post biopsy cavity and clips are identified in the right breast 12 o'clock position. There is minimal enhancement surrounding the biopsy cavity probably post biopsy changes. There is 9 x 5 x 5 mm irregular focal enhancement with plateau enhancement kinetics at the right  breast 5:30 o'clock to 6 o'clock posterior 1/3 right breast.  Left breast: No mass or abnormal enhancement.  Lymph nodes: No abnormal appearing lymph nodes.  Ancillary findings:  None.  IMPRESSION: Suspicious findings. Post biopsy cavity at the right breast 12 o'clock correlating to site of recent biopsy. There is a 9 x 5 x 5 mm irregular focus at the right breast 5:30 o'clock to 6 o'clock posterior 1/3.  RECOMMENDATION: Second-look ultrasound with biopsy of the right breast 5:30 to 6 o'clock lesion. If this is not found on ultrasound, an MRI guided core biopsy is recommended.  BI-RADS CATEGORY  4: Suspicious abnormality - biopsy should be considered.   Electronically Signed   By: Abelardo Diesel M.D.   On: 08/27/2013 14:59   Mm Digital Diagnostic Unilat R  08/07/2013   CLINICAL DATA:  Patient recalled from screening for right breast calcifications.  EXAM: DIGITAL DIAGNOSTIC  RIGHT MAMMOGRAM  COMPARISON:  Screening mammogram 07/04/2013  ACR Breast Density Category c: The breast tissue is heterogeneously dense, which may obscure small masses.  FINDINGS: Within the upper-outer quadrant of the right breast middle  to posterior depth there is a 7 x 6 x 6 mm group of calcifications of varying sizes and shapes.  IMPRESSION: Suspicious right breast calcifications.  RECOMMENDATION: Stereotactic guided core needle biopsy right breast calcifications. Biopsy scheduled for Monday August 13, 2013 at 10 a.m.  I have discussed the findings and recommendations with the patient. Results were also provided in writing at the conclusion of the visit. If applicable, a reminder letter will be sent to the patient regarding the next appointment.  BI-RADS CATEGORY  4: Suspicious abnormality - biopsy should be considered.   Electronically Signed   By: Lovey Newcomer M.D.   On: 08/07/2013 12:23   Mm Rt Breast Bx W Loc Dev 1st Lesion Image Bx Spec Stereo Guide  08/20/2013   ADDENDUM REPORT: 08/20/2013 11:33  ADDENDUM: Addendum by Dr. Nyoka Cowden on  08/20/2013. I discussed the pathology results of DCIS with the patient and her husband by telephone. This is concordant. She reports no problems at the biopsy site. Breast MRI will be scheduled. Multidisciplinary clinic has been scheduled for 08/29/2013. All questions were answered.   Electronically Signed   By: Conchita Paris M.D.   On: 08/20/2013 11:33   08/20/2013   CLINICAL DATA:  Right breast calcifications.  EXAM: RIGHT STEREOTACTIC CORE NEEDLE BIOPSY  COMPARISON:  Previous exams.  FINDINGS: The patient and I discussed the procedure of stereotactic-guided biopsy including benefits and alternatives. We discussed the high likelihood of a successful procedure. We discussed the risks of the procedure including infection, bleeding, tissue injury, clip migration, and inadequate sampling. Informed written consent was given. The usual time out protocol was performed immediately prior to the procedure.  Using sterile technique and 2% Lidocaine as local anesthetic, under stereotactic guidance, a 9 gauge Suros device was used to perform core needle biopsy of calcifications in the upper-outer right breast using a lateral to medial approach. Specimen radiograph was performed showing calcifications. Specimens with calcifications are identified for pathology.  At the conclusion of the procedure, a coil shaped tissue marker clip was deployed into the biopsy cavity. Follow-up 2-view mammogram confirmed clip placement. The clip is approximately 6 mm medial and posterior to the biopsied calcifications.  IMPRESSION: Stereotactic-guided biopsy of right breast calcifications. No apparent complications. The clip is approximately 6 mm medial and posterior to the biopsied calcifications.  Electronically Signed: By: Donavan Burnet M.D. On: 08/17/2013 15:23     LABS:    Chemistry      Component Value Date/Time   NA 142 08/29/2013 1221   K 3.8 08/29/2013 1221   CO2 26 08/29/2013 1221   BUN 17.4 08/29/2013 1221   CREATININE  0.8 08/29/2013 1221      Component Value Date/Time   CALCIUM 9.9 08/29/2013 1221   ALKPHOS 86 08/29/2013 1221   AST 21 08/29/2013 1221   ALT 16 08/29/2013 1221   BILITOT 0.55 08/29/2013 1221      Lab Results  Component Value Date   WBC 7.6 08/29/2013   HGB 13.7 08/29/2013   HCT 42.1 08/29/2013   MCV 93.3 08/29/2013   PLT 273 08/29/2013        ASSESSMENT/PLAN   63 year old female with   1. Stage 0 (TisNx) high grade DCIS of the right breast found on screening mammogram.The DCIS was ER+/PR+   2. We spent the better part of today's hour-long appointment discussing the biology of breast cancer in general, and the specifics of the patient's tumor in particular. We discussed the multidisciplinary approach  to breast cancer treatment. We went over her pathology. She understands that her cancer is a non invasive disease. She is a good candidate for breast conservation with her lumpectomy. She will also need adjuvant radiation therapy.  3. We discussed the role of adjuvant anti-estrogen therapy to help prevent future ER+ breast cancer in the ipsilateral and contralateral breasts. We discussed the different drugs available including tamoxifen. Which would be 20 mg daily for a total of 5 years. We also discussed aromasin 25 mg daily for 5 years. .we discussed risks benefits and side effects and complications of these medications.  4. Followup: Patient will be seen back after her surgery to discuss her final pathology.   Thank you so much for allowing me to participate in the care of Alison Zimmerman. I will continue to follow up the patient with you and assist in her care.  All questions were answered. The patient knows to call the clinic with any problems, questions or concerns. We can certainly see the patient much sooner if necessary.  I spent 30 minutes counseling the patient face to face. The total time spent in the appointment was 45 minutes.  Marcy Panning, MD Medical/Oncology [facility] [phone] (beeper) [phone] (Office)  08/31/2013, 12:55 PM

## 2013-08-31 NOTE — Telephone Encounter (Signed)
Faxed Care Plan to PCP and took to HIM to scan.  

## 2013-09-03 ENCOUNTER — Ambulatory Visit
Admission: RE | Admit: 2013-09-03 | Discharge: 2013-09-03 | Disposition: A | Discharge status: Home / Self Care | Payer: No Typology Code available for payment source | Source: Ambulatory Visit | Attending: Medical | Admitting: Medical

## 2013-09-03 ENCOUNTER — Telehealth: Payer: Self-pay | Admitting: Adult Health

## 2013-09-03 DIAGNOSIS — R928 Other abnormal and inconclusive findings on diagnostic imaging of breast: Secondary | ICD-10-CM

## 2013-09-03 NOTE — Telephone Encounter (Signed)
, °

## 2013-09-05 ENCOUNTER — Ambulatory Visit
Admission: RE | Admit: 2013-09-05 | Discharge: 2013-09-05 | Disposition: A | Discharge status: Home / Self Care | Payer: No Typology Code available for payment source | Source: Ambulatory Visit | Attending: Medical | Admitting: Medical

## 2013-09-05 ENCOUNTER — Ambulatory Visit
Admission: RE | Admit: 2013-09-05 | Discharge: 2013-09-05 | Disposition: A | Discharge status: Home / Self Care | Payer: Self-pay | Source: Ambulatory Visit | Attending: Medical | Admitting: Medical

## 2013-09-05 DIAGNOSIS — R928 Other abnormal and inconclusive findings on diagnostic imaging of breast: Secondary | ICD-10-CM

## 2013-09-05 MED ORDER — GADOBENATE DIMEGLUMINE 529 MG/ML IV SOLN
10.0000 mL | Freq: Once | INTRAVENOUS | Status: AC | PRN
  Administered 2013-09-05: 10 mL via INTRAVENOUS

## 2013-09-07 ENCOUNTER — Telehealth: Payer: Self-pay | Admitting: *Deleted

## 2013-09-07 NOTE — Telephone Encounter (Signed)
Called and spoke with patient from [facility] 08/29/13.  No questions or concerns at this time.  She is going to visit her new granddaughter next week and will be gone until 09/26/13.  I have sent an in basket to Dr. Brantley Stage to let him know.  Encouraged patient to call with any questions.

## 2013-09-12 ENCOUNTER — Other Ambulatory Visit (INDEPENDENT_AMBULATORY_CARE_PROVIDER_SITE_OTHER): Payer: Self-pay | Admitting: Surgery

## 2013-09-12 DIAGNOSIS — D0510 Intraductal carcinoma in situ of unspecified breast: Secondary | ICD-10-CM

## 2013-09-17 ENCOUNTER — Encounter: Payer: Self-pay | Admitting: *Deleted

## 2013-09-18 ENCOUNTER — Telehealth: Payer: Self-pay | Admitting: Oncology

## 2013-09-18 NOTE — Telephone Encounter (Signed)
no more pt put on [facility] sched at this time...MDale taking pof to [facility] to r/s

## 2013-09-19 ENCOUNTER — Encounter: Payer: Self-pay | Admitting: *Deleted

## 2013-09-25 ENCOUNTER — Telehealth: Payer: Self-pay | Admitting: *Deleted

## 2013-09-25 NOTE — Telephone Encounter (Signed)
Called and spoke with patient to change her appointment.  Informed patient that [name] is on an unexpected leave of absence and needed to get her rescheduled with Dr. Jana Hakim.  Confirmed new appointment for 12/04/13 at 1030 for labs and 1100 with Dr. Jana Hakim.  Encouraged patient to call with with any needs.

## 2013-09-26 ENCOUNTER — Encounter (HOSPITAL_BASED_OUTPATIENT_CLINIC_OR_DEPARTMENT_OTHER): Payer: Self-pay | Admitting: *Deleted

## 2013-09-26 NOTE — Progress Notes (Signed)
Dr Brantley Stage ordered cbc cmet-pt had some 08/29/13-emailed him to see if needs to be repeated-no labs needed for anesthesia. Pt is out of town to see new grandchild-she can come in Monday if needed

## 2013-09-28 ENCOUNTER — Telehealth (INDEPENDENT_AMBULATORY_CARE_PROVIDER_SITE_OTHER): Payer: Self-pay | Admitting: Surgery

## 2013-09-28 ENCOUNTER — Encounter (INDEPENDENT_AMBULATORY_CARE_PROVIDER_SITE_OTHER): Payer: No Typology Code available for payment source | Admitting: Surgery

## 2013-09-28 NOTE — Telephone Encounter (Signed)
Alison Zimmerman called left message stating he is cancelling his wife Alison Zimmerman breast surgery cancel her surgery  He can be reached at [phone]

## 2013-09-28 NOTE — Telephone Encounter (Signed)
LM stating personal matter will not make appt today at 10:15 am  Returned call to [place] at [phone] LM on his VM

## 2013-10-02 ENCOUNTER — Ambulatory Visit (HOSPITAL_BASED_OUTPATIENT_CLINIC_OR_DEPARTMENT_OTHER)
Admission: RE | Admit: 2013-10-02 | Payer: No Typology Code available for payment source | Source: Ambulatory Visit | Admitting: Surgery

## 2013-10-02 ENCOUNTER — Inpatient Hospital Stay: Admission: RE | Admit: 2013-10-02 | Payer: No Typology Code available for payment source | Source: Ambulatory Visit

## 2013-10-02 SURGERY — BREAST LUMPECTOMY WITH NEEDLE LOCALIZATION
Anesthesia: General | Site: Breast | Laterality: Right

## 2013-10-03 ENCOUNTER — Other Ambulatory Visit: Payer: No Typology Code available for payment source

## 2013-10-03 ENCOUNTER — Ambulatory Visit: Payer: No Typology Code available for payment source | Admitting: Adult Health

## 2013-10-18 ENCOUNTER — Ambulatory Visit: Payer: No Typology Code available for payment source

## 2013-10-18 ENCOUNTER — Ambulatory Visit: Payer: No Typology Code available for payment source | Admitting: Radiation Oncology

## 2013-10-19 ENCOUNTER — Encounter (INDEPENDENT_AMBULATORY_CARE_PROVIDER_SITE_OTHER): Payer: No Typology Code available for payment source | Admitting: Surgery

## 2013-11-01 ENCOUNTER — Telehealth: Payer: Self-pay | Admitting: *Deleted

## 2013-11-01 NOTE — Telephone Encounter (Signed)
Left message for a return phone call regarding patient's schedule.  Awaiting patient response.

## 2013-12-03 ENCOUNTER — Other Ambulatory Visit: Payer: Self-pay | Admitting: *Deleted

## 2013-12-03 DIAGNOSIS — C50411 Malignant neoplasm of upper-outer quadrant of right female breast: Secondary | ICD-10-CM

## 2013-12-04 ENCOUNTER — Other Ambulatory Visit: Payer: No Typology Code available for payment source

## 2013-12-04 ENCOUNTER — Ambulatory Visit: Payer: No Typology Code available for payment source | Admitting: Oncology

## 2013-12-05 ENCOUNTER — Telehealth: Payer: Self-pay | Admitting: Oncology

## 2013-12-05 NOTE — Telephone Encounter (Signed)
Sent letter to patient from Dr. Magrinat. °

## 2013-12-24 ENCOUNTER — Other Ambulatory Visit: Payer: Self-pay | Admitting: Medical

## 2014-07-12 ENCOUNTER — Ambulatory Visit (INDEPENDENT_AMBULATORY_CARE_PROVIDER_SITE_OTHER): Payer: No Typology Code available for payment source | Admitting: Medical

## 2014-07-12 ENCOUNTER — Encounter: Payer: Self-pay | Admitting: Medical

## 2014-07-12 VITALS — BP 130/80 | HR 78 | Temp 97.7°F | Resp 14 | Wt 116.0 lb

## 2014-07-12 DIAGNOSIS — J209 Acute bronchitis, unspecified: Secondary | ICD-10-CM

## 2014-07-12 MED ORDER — BENZONATATE 200 MG PO CAPS: 200.0000 mg | ORAL_CAPSULE | Freq: Three times a day (TID) | ORAL | Status: DC | PRN

## 2014-07-12 MED ORDER — ALBUTEROL SULFATE HFA 108 (90 BASE) MCG/ACT IN AERS
2.0000 | INHALATION_SPRAY | Freq: Four times a day (QID) | RESPIRATORY_TRACT | Status: DC | PRN
Start: 2014-07-12 — End: 2015-01-08

## 2014-07-12 NOTE — Progress Notes (Signed)
Subjective:  Alison Zimmerman is a 64 y.o. female who presents for recheck on bronchitis.  She notes that a week ago she was in [place] visiting her daughter, felt like the duct work in her house needed cleaning had a lot of dust. Over the course of the time she was at her daughter's she started having cough, sore throat, shortness of breath and was taken to the emergency department a week ago. Was treated for bronchitis, given doxycycline antibiotic, Tessalon Perles for cough, albuterol inhaler. Chest x-ray did not show pneumonia or other abnormality. She has completed the [employer] only did the albuterol for a few days,and has 1 more day left of the doxycycline antibiotic. Her main concern today is lingering cough otherwise feels quite improved.she is a nonsmoker, no history of asthma or COPD. No sick contacts.  No other aggravating or relieving factors.  No other c/o.  The following portions of the patient's history were reviewed and updated as appropriate: allergies, current medications, past family history, past medical history, past social history, past surgical history and problem list.  Past Medical History  Diagnosis Date  . Skin complaints     sees Dr. Allyson Sabal  . History of kidney stones     Dr. Gaynelle Arabian, [facility]  . Wears glasses   . Migraine   . H/O bone density study     remote past  . History of mammogram     remote past  . 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 Anxiety   . Wears glasses    ROS as in subjective   Objective: BP 130/80 mmHg  Pulse 78  Temp(Src) 97.7 F (36.5 C) (Oral)  Resp 14  Wt 116 lb (52.617 kg)  General appearance: Alert, WD/WN, no distress                             Skin: warm, no rash, no diaphoresis                           Head: no sinus tenderness                            Eyes: conjunctiva normal, corneas clear, PERRLA                            Ears: pearly TMs,  external ear canals normal                          Nose: septum midline, turbinates swollen, with erythema and clear discharge             Mouth/throat: MMM, tongue normal, mild pharyngeal erythema                           Neck: supple, no adenopathy, no thyromegaly, nontender                          Heart: RRR, normal S1, S2, no murmurs                         Lungs: few +scattered rhonchi, no wheezes, no rales  Extremities: no edema, nontender     Assessment: Encounter Diagnosis  Name Primary?  . Acute bronchitis, unspecified organism Yes    Plan:  Medication orders today include: Tessalon perles, albuterol inhaler.     Exam is mostly noncontributory today except for a few rhonchi in the lungs.  Mostly improved.  Advise she finished the doxycycline antibiotic, advise she go ahead and use the albuterol 2-3 times daily through the weekend, I refilled the Tessalon Perles and albuterol since the albuterol was just a sample. Advise rest,  Tylenol or Ibuprofen OTC for fever and malaise, can use Mucinex DM if desired for cough/congestion.  Call/return in 2-3 days if symptoms are worse or not improving over the weekend

## 2014-07-12 NOTE — Patient Instructions (Signed)
Bronchitis  Recommendations:  Alison Zimmerman out the Doxycycline antibiotic  Use the ProAir inhaler 3 times daily during the weekend  Use the Tessalon Perles cough medication up to 3 times daily as needed  Consider using Mucinex or Mucinex DM OTC through the weekend  increase water intake  Rest  And if not improving by Monday, call back

## 2014-07-26 ENCOUNTER — Other Ambulatory Visit: Payer: Self-pay | Admitting: Medical

## 2014-07-26 ENCOUNTER — Encounter: Payer: Self-pay | Admitting: Medical

## 2014-07-26 ENCOUNTER — Ambulatory Visit
Admission: RE | Admit: 2014-07-26 | Discharge: 2014-07-26 | Disposition: A | Discharge status: Home / Self Care | Payer: No Typology Code available for payment source | Source: Ambulatory Visit | Attending: Medical | Admitting: Medical

## 2014-07-26 ENCOUNTER — Ambulatory Visit (INDEPENDENT_AMBULATORY_CARE_PROVIDER_SITE_OTHER): Payer: No Typology Code available for payment source | Admitting: Medical

## 2014-07-26 VITALS — BP 110/80 | HR 78 | Temp 98.2°F | Resp 14 | Wt 114.0 lb

## 2014-07-26 DIAGNOSIS — R062 Wheezing: Secondary | ICD-10-CM | POA: Diagnosis not present

## 2014-07-26 DIAGNOSIS — R059 Cough, unspecified: Secondary | ICD-10-CM

## 2014-07-26 DIAGNOSIS — R05 Cough: Secondary | ICD-10-CM

## 2014-07-26 LAB — CBC WITH DIFFERENTIAL/PLATELET
BASOS ABS: 0 10*3/uL (ref 0.0–0.1)
Eosinophils Absolute: 0 10*3/uL (ref 0.0–0.7)
HEMATOCRIT: 42.1 % (ref 36.0–46.0)
Hemoglobin: 13.5 g/dL (ref 12.0–15.0)
LYMPHS ABS: 1.8 10*3/uL (ref 0.7–4.0)
Lymphocytes Relative: 21 % (ref 12–46)
MCH: 29.4 pg (ref 26.0–34.0)
MCHC: 32 g/dL (ref 30.0–36.0)
MCV: 91.8 fL (ref 78.0–100.0)
Monocytes Absolute: 0.9 10*3/uL (ref 0.1–1.0)
Monocytes Relative: 10 % (ref 3–12)
NEUTROS PCT: 69 % (ref 43–77)
Neutro Abs: 5.9 10*3/uL (ref 1.7–7.7)
Platelets: 266 10*3/uL (ref 150–400)
RBC: 4.59 MIL/uL (ref 3.87–5.11)
RDW: 12.6 % (ref 11.5–15.5)
WBC: 8.5 10*3/uL (ref 4.0–10.5)

## 2014-07-26 MED ORDER — HYDROCODONE-HOMATROPINE 5-1.5 MG/5ML PO SYRP: 5.0000 mL | ORAL_SOLUTION | Freq: Three times a day (TID) | ORAL | Status: DC | PRN

## 2014-07-26 MED ORDER — AZITHROMYCIN 250 MG PO TABS: ORAL_TABLET | ORAL | Status: DC

## 2014-07-26 MED ORDER — METHYLPREDNISOLONE (PAK) 4 MG PO TABS: ORAL_TABLET | ORAL | Status: DC

## 2014-07-26 NOTE — Progress Notes (Signed)
Subjective:  Alison Zimmerman is a 64 y.o. female who presents for recheck on cough and wheezing.  She presents today with her husband. I saw her on 07/12/14 for the same. Her main complaint today is that the cough has lingered, still coughing up some phlegm. Using Delsym over-the-counter. Still using the albuterol some but after discussing proper use it sounds like she is not using the albuterol properly. She has finished the doxycycline antibiotic.  She originally was seen before her last visit by the emergency department in [place] while she was visiting her daughter, with the diagnosis of bronchitis and was giving an inhaler, doxycycline, and after I saw her we added on Tessalon Perles.  She reports some mild sore throat from coughing, feels hot at times but knows sweats or chills. No other current symptoms, no fever no nausea vomiting no sinus pressure.  She is a nonsmoker, no history of asthma or COPD. No sick contacts.  No other aggravating or relieving factors.  No other c/o.  The following portions of the patient's history were reviewed and updated as appropriate: allergies, current medications, past family history, past medical history, past social history, past surgical history and problem list.  Past Medical History  Diagnosis Date  . Skin complaints     sees Dr. Allyson Sabal  . History of kidney stones     Dr. Gaynelle Arabian, [facility]  . Wears glasses   . Migraine   . H/O bone density study     remote past  . History of mammogram     remote past  . 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 Anxiety   . Wears glasses    ROS as in subjective   Objective: BP 110/80 mmHg  Pulse 78  Temp(Src) 98.2 F (36.8 C) (Oral)  Resp 14  Wt 114 lb (51.71 kg)  SpO2 98%  General appearance: Alert, WD/WN, no distress                             Skin: warm, no rash, no diaphoresis                           Head: no sinus tenderness                       Eyes: conjunctiva normal, corneas clear, PERRLA                            Ears: pearly TMs, external ear canals normal                          Nose: septum midline, turbinates swollen, with erythema and clear discharge             Mouth/throat: MMM, tongue normal, mild pharyngeal erythema                           Neck: supple, no adenopathy, no thyromegaly, nontender                          Heart: RRR, normal S1, S2, no murmurs  Lungs: few +scattered lower field rhonchi, no wheezes, no rales                Extremities: no edema, nontender     Assessment: Encounter Diagnoses  Name Primary?  . Cough Yes  . Wheezing     Plan:  Medication orders today include: Hycodan syrup.  Discussed the proper use of albuterol inhaler, advise she continue albuterol 2 puffs 2-3 times daily, Hycodan syrup for worse cough, good hydration, labs today and she will go for chest x-ray.  Unless x-ray shows concern for pneumonia we will likely add on Medrol Dosepak

## 2014-07-31 ENCOUNTER — Telehealth: Payer: Self-pay | Admitting: Internal Medicine

## 2014-07-31 NOTE — Telephone Encounter (Signed)
Please call and inquire, and see how she is doing since I last saw her

## 2014-07-31 NOTE — Telephone Encounter (Signed)
LMOM TO CB. CLS 

## 2014-07-31 NOTE — Telephone Encounter (Signed)
Pt called stating she had questions about her xray she had done that she wants to ask you, As there was some confusion

## 2014-08-02 NOTE — Telephone Encounter (Signed)
Pt called stating that she finished the antibiotic and is feeling better but she wants more of an explanation on what reactive airway means. Please advise patient of this.

## 2014-08-02 NOTE — Telephone Encounter (Signed)
LMOM TO CB. CLS 

## 2014-08-05 NOTE — Telephone Encounter (Signed)
When someone has an acute infection or inflammation from mucous congestion or infection, you can have wheezing and tightness in the chest which is considered reactive airway.  Thus I do not suspect any long-term lung disease, but this is a common finding when someone has a bronchitis or acute illness.  In other words, the recent bronchitis upset or aggravated the little air sacs in the lungs causing cough, wheezing, tightness.

## 2014-08-05 NOTE — Telephone Encounter (Signed)
Pt was notified.  

## 2014-10-22 ENCOUNTER — Other Ambulatory Visit: Payer: Self-pay | Admitting: Medical

## 2014-10-22 NOTE — Telephone Encounter (Signed)
Is this patient to continue Rx Vitamin D or does she need to just get OTC Vitamin D?

## 2014-10-22 NOTE — Telephone Encounter (Signed)
PATIENT IS DUE FOR PHYSICAL. PLEASE, [phone] TO SET UP YOUR PHYSICAL APPOINTMENT AND WE NEED TO RECHECK YOUR VITAMIN D LEVEL

## 2014-10-22 NOTE — Telephone Encounter (Signed)
Refill Vit D and set up for physical

## 2014-10-23 NOTE — Telephone Encounter (Signed)
I got this back in my box.  Not sure if she has been contacted or med sent?

## 2014-10-23 NOTE — Telephone Encounter (Signed)
Medication was sent and message was left for patient to follow up

## 2014-11-06 ENCOUNTER — Other Ambulatory Visit: Payer: Self-pay | Admitting: Medical

## 2014-12-19 ENCOUNTER — Other Ambulatory Visit: Payer: Self-pay | Admitting: Medical

## 2014-12-19 DIAGNOSIS — Z1231 Encounter for screening mammogram for malignant neoplasm of breast: Secondary | ICD-10-CM

## 2014-12-19 DIAGNOSIS — Z853 Personal history of malignant neoplasm of breast: Secondary | ICD-10-CM

## 2015-01-02 ENCOUNTER — Encounter: Payer: No Typology Code available for payment source | Admitting: Medical

## 2015-01-03 ENCOUNTER — Ambulatory Visit
Admission: RE | Admit: 2015-01-03 | Discharge: 2015-01-03 | Disposition: A | Discharge status: Home / Self Care | Payer: No Typology Code available for payment source | Source: Ambulatory Visit | Attending: Medical | Admitting: Medical

## 2015-01-03 DIAGNOSIS — Z853 Personal history of malignant neoplasm of breast: Secondary | ICD-10-CM

## 2015-01-03 DIAGNOSIS — Z1231 Encounter for screening mammogram for malignant neoplasm of breast: Secondary | ICD-10-CM

## 2015-01-06 ENCOUNTER — Telehealth: Payer: Self-pay | Admitting: Medical

## 2015-01-06 NOTE — Telephone Encounter (Signed)
Left pt message to please return call

## 2015-01-06 NOTE — Telephone Encounter (Signed)
I called and left message for her to call back.  I want to verify she is aware of the results and has f/u planned ( with surgeon).  If not, let me know so I can speak to her.  FYI - abnormal mammogram.

## 2015-01-07 NOTE — Telephone Encounter (Signed)
shane is not in the office now as he has left for the day so pt wanted to come in to discuss labs as she is not aware of her results

## 2015-01-07 NOTE — Telephone Encounter (Signed)
Coming in tomorrow for discussion

## 2015-01-08 ENCOUNTER — Telehealth: Payer: Self-pay | Admitting: Medical

## 2015-01-08 ENCOUNTER — Encounter: Payer: Self-pay | Admitting: Medical

## 2015-01-08 ENCOUNTER — Other Ambulatory Visit (HOSPITAL_COMMUNITY)
Admission: RE | Admit: 2015-01-08 | Discharge: 2015-01-08 | Disposition: A | Discharge status: Home / Self Care | Payer: No Typology Code available for payment source | Source: Ambulatory Visit | Attending: Family Medicine | Admitting: Family Medicine

## 2015-01-08 ENCOUNTER — Ambulatory Visit (INDEPENDENT_AMBULATORY_CARE_PROVIDER_SITE_OTHER): Payer: No Typology Code available for payment source | Admitting: Medical

## 2015-01-08 VITALS — BP 124/80 | HR 92 | Wt 118.8 lb

## 2015-01-08 DIAGNOSIS — R928 Other abnormal and inconclusive findings on diagnostic imaging of breast: Secondary | ICD-10-CM | POA: Diagnosis not present

## 2015-01-08 DIAGNOSIS — Z1211 Encounter for screening for malignant neoplasm of colon: Secondary | ICD-10-CM | POA: Diagnosis not present

## 2015-01-08 DIAGNOSIS — Z124 Encounter for screening for malignant neoplasm of cervix: Secondary | ICD-10-CM

## 2015-01-08 DIAGNOSIS — Z01419 Encounter for gynecological examination (general) (routine) without abnormal findings: Secondary | ICD-10-CM | POA: Diagnosis not present

## 2015-01-08 DIAGNOSIS — N841 Polyp of cervix uteri: Secondary | ICD-10-CM

## 2015-01-08 LAB — COMPREHENSIVE METABOLIC PANEL
ALBUMIN: 5 g/dL (ref 3.6–5.1)
ALT: 15 U/L (ref 6–29)
AST: 21 U/L (ref 10–35)
Alkaline Phosphatase: 86 U/L (ref 33–130)
BUN: 13 mg/dL (ref 7–25)
CALCIUM: 10.1 mg/dL (ref 8.6–10.4)
CHLORIDE: 96 mmol/L — AB (ref 98–110)
CO2: 27 mmol/L (ref 20–31)
Creat: 0.94 mg/dL (ref 0.50–0.99)
Glucose, Bld: 85 mg/dL (ref 65–99)
POTASSIUM: 5.1 mmol/L (ref 3.5–5.3)
SODIUM: 137 mmol/L (ref 135–146)
Total Bilirubin: 0.7 mg/dL (ref 0.2–1.2)
Total Protein: 8.2 g/dL — ABNORMAL HIGH (ref 6.1–8.1)

## 2015-01-08 LAB — TSH: TSH: 0.699 u[IU]/mL (ref 0.350–4.500)

## 2015-01-08 LAB — CBC
HCT: 43.6 % (ref 36.0–46.0)
Hemoglobin: 15 g/dL (ref 12.0–15.0)
MCH: 30.7 pg (ref 26.0–34.0)
MCHC: 34.4 g/dL (ref 30.0–36.0)
MCV: 89.2 fL (ref 78.0–100.0)
MPV: 10.3 fL (ref 8.6–12.4)
Platelets: 313 10*3/uL (ref 150–400)
RBC: 4.89 MIL/uL (ref 3.87–5.11)
RDW: 12.6 % (ref 11.5–15.5)
WBC: 7.9 10*3/uL (ref 4.0–10.5)

## 2015-01-08 NOTE — Progress Notes (Signed)
Subjective: Here to discuss recent abnormal mammogram.  Accompanied by her husband.  She has no new symptoms, in general feeling healthy.  She is past due for pap, and has prior refused colonoscopies.  She did have a reviewed of results with the radiologist, but they have many questions.  She does have info on contacting surgeon's office for visit, but she hasn't scheduled this yet.  No new c/o.    Past Medical History  Diagnosis Date  . Skin complaints     sees Dr. Allyson Sabal  . History of kidney stones     Dr. Gaynelle Arabian, [facility]  . Wears glasses   . Migraine   . H/O bone density study     remote past  . History of mammogram     remote past  . 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 Anxiety   . Wears glasses    Past Surgical History  Procedure Laterality Date  . Tubal ligation    . Bunionectomy  2000    Dr. Gwyneth Revels, right  . Colonoscopy  1/15    never  . Orif femur fracture      age 27yr-left   Social History   Social History  . Marital Status: Married    Spouse Name: N/A  . Number of Children: N/A  . Years of Education: N/A   Occupational History  . Not on file.   Social History Main Topics  . Smoking status: Never Smoker   . Smokeless tobacco: Not on file  . Alcohol Use: No  . Drug Use: No  . Sexual Activity: Yes   Other Topics Concern  . Not on file   Social History Narrative   Married, has 2 daughters, 3 children.  Exercise - low impact water aerobics, retired, use to work for [employer];      Objective BP 124/80 mmHg  Pulse 92  Wt 118 lb 12.8 oz (53.887 kg)  Gen: wdwn, nad Lungs clear Heart: rrr, normal s1, s2, no murmurs Abdomen: +bs, soft, nontender, no mass, no organomegaly Possible palpable lymph node, small in right supraclavicular area medially, otherwise no other lymphadenopathy Breast: right breast at 11 o'clock and 8 o'clock with small surgical scars, otherwise non tender, no masses or  lumps, no skin changes, no nipple discharge or inversion, no axillary lymphadenopathy Gyn: Normal external genitalia without lesions, vagina with normal mucosa, cervix with small amount of tissue protruding out of the cervix suggestive of polyp, no cervical motion tenderness, no abnormal vaginal discharge.  Uterus and adnexa not enlarged, non tender, no masses.  Pap performed.  Exam chaperoned by nurse. Rectal: deferred     Assessment: Encounter Diagnoses  Name Primary?  . Abnormal mammogram of right breast Yes  . Screening for cervical cancer   . Special screening for malignant neoplasms, colon     Plan: discussed her recent mammogram findings.  reviewed 08/2013 oncology notes, pathology reports and labs.  She never went for adjuvant radiation therapy last year.   In general discussed other cancer screening.   She will contact general surgery for appointment.  She has this info already.   Labs today.   Breast exam and pelvic done today.   Pap sent.   Will refer for Colo guard since she declines colonoscopy.   F/u pending labs and general surgery consult.

## 2015-01-08 NOTE — Telephone Encounter (Signed)
Refer for cologuard

## 2015-01-08 NOTE — Telephone Encounter (Signed)
Faxed form over to [employer] for cologuard @ [phone].

## 2015-01-13 LAB — CYTOLOGY - PAP

## 2015-01-27 ENCOUNTER — Telehealth: Payer: Self-pay | Admitting: Medical

## 2015-01-27 NOTE — Telephone Encounter (Signed)
Please let her know that the Cologuard test was negative for cancer!  The minimal recommendation is repeat scan in 3 years.

## 2015-01-28 NOTE — Telephone Encounter (Signed)
Pt notified of results

## 2015-01-28 NOTE — Telephone Encounter (Signed)
Left message for pt to call me back 

## 2015-02-07 ENCOUNTER — Encounter: Payer: Self-pay | Admitting: Medical

## 2015-05-15 ENCOUNTER — Encounter: Payer: Self-pay | Admitting: Family Medicine

## 2015-05-15 ENCOUNTER — Ambulatory Visit: Payer: No Typology Code available for payment source | Admitting: Family Medicine

## 2015-05-15 ENCOUNTER — Ambulatory Visit (INDEPENDENT_AMBULATORY_CARE_PROVIDER_SITE_OTHER): Payer: No Typology Code available for payment source | Admitting: Family Medicine

## 2015-05-15 VITALS — BP 110/68 | HR 60 | Temp 98.3°F | Wt 117.6 lb

## 2015-05-15 DIAGNOSIS — R1031 Right lower quadrant pain: Secondary | ICD-10-CM | POA: Diagnosis not present

## 2015-05-15 DIAGNOSIS — R109 Unspecified abdominal pain: Secondary | ICD-10-CM | POA: Diagnosis not present

## 2015-05-15 DIAGNOSIS — R350 Frequency of micturition: Secondary | ICD-10-CM | POA: Diagnosis not present

## 2015-05-15 DIAGNOSIS — R112 Nausea with vomiting, unspecified: Secondary | ICD-10-CM | POA: Diagnosis not present

## 2015-05-15 DIAGNOSIS — K5900 Constipation, unspecified: Secondary | ICD-10-CM | POA: Diagnosis not present

## 2015-05-15 DIAGNOSIS — N390 Urinary tract infection, site not specified: Secondary | ICD-10-CM | POA: Diagnosis not present

## 2015-05-15 DIAGNOSIS — R82998 Other abnormal findings in urine: Secondary | ICD-10-CM

## 2015-05-15 LAB — COMPREHENSIVE METABOLIC PANEL
ALK PHOS: 71 U/L (ref 33–130)
ALT: 20 U/L (ref 6–29)
AST: 26 U/L (ref 10–35)
Albumin: 4.1 g/dL (ref 3.6–5.1)
BUN: 21 mg/dL (ref 7–25)
CALCIUM: 9 mg/dL (ref 8.6–10.4)
CO2: 23 mmol/L (ref 20–31)
Chloride: 100 mmol/L (ref 98–110)
Creat: 1.24 mg/dL — ABNORMAL HIGH (ref 0.50–0.99)
GLUCOSE: 132 mg/dL — AB (ref 65–99)
POTASSIUM: 3.9 mmol/L (ref 3.5–5.3)
Sodium: 136 mmol/L (ref 135–146)
Total Bilirubin: 0.9 mg/dL (ref 0.2–1.2)
Total Protein: 6.9 g/dL (ref 6.1–8.1)

## 2015-05-15 LAB — POCT URINALYSIS DIPSTICK
Bilirubin, UA: NEGATIVE
Glucose, UA: NEGATIVE
KETONES UA: NEGATIVE
Nitrite, UA: NEGATIVE
SPEC GRAV UA: 1.025
Urobilinogen, UA: NEGATIVE
pH, UA: 6

## 2015-05-15 LAB — POCT UA - MICROSCOPIC ONLY: RBC, urine, microscopic: <2

## 2015-05-15 MED ORDER — ONDANSETRON HCL 4 MG PO TABS: 4.0000 mg | ORAL_TABLET | Freq: Three times a day (TID) | ORAL | Status: DC | PRN

## 2015-05-15 MED ORDER — SULFAMETHOXAZOLE-TRIMETHOPRIM 800-160 MG PO TABS: 1.0000 | ORAL_TABLET | Freq: Two times a day (BID) | ORAL | Status: DC

## 2015-05-15 NOTE — Patient Instructions (Signed)
Make sure you are staying well hydrated. You can try over the counter Miralax or metamucil and increase the fiber in your diet for the constipation. If you are unable to move your bowels or develop abdominal pain or uncontrolled vomiting, or any other concerning symptoms you should seek medical attention.  If you are not back to normal after completing the antibiotic let us know.  I prescribed nausea medication in case you need it.   Constipation, Adult Constipation is when a person has fewer than three bowel movements a week, has difficulty having a bowel movement, or has stools that are dry, hard, or larger than normal. As people grow older, constipation is more common. A low-fiber diet, not taking in enough fluids, and taking certain medicines may make constipation worse.  CAUSES   Certain medicines, such as antidepressants, pain medicine, iron supplements, antacids, and water pills.   Certain diseases, such as diabetes, irritable bowel syndrome (IBS), thyroid disease, or depression.   Not drinking enough water.   Not eating enough fiber-rich foods.   Stress or travel.   Lack of physical activity or exercise.   Ignoring the urge to have a bowel movement.   Using laxatives too much.  SIGNS AND SYMPTOMS   Having fewer than three bowel movements a week.   Straining to have a bowel movement.   Having stools that are hard, dry, or larger than normal.   Feeling full or bloated.   Pain in the lower abdomen.   Not feeling relief after having a bowel movement.  DIAGNOSIS  Your health care provider will take a medical history and perform a physical exam. Further testing may be done for severe constipation. Some tests may include:  A barium enema X-ray to examine your rectum, colon, and, sometimes, your small intestine.   A sigmoidoscopy to examine your lower colon.   A colonoscopy to examine your entire colon. TREATMENT  Treatment will depend on the severity of  your constipation and what is causing it. Some dietary treatments include drinking more fluids and eating more fiber-rich foods. Lifestyle treatments may include regular exercise. If these diet and lifestyle recommendations do not help, your health care provider may recommend taking over-the-counter laxative medicines to help you have bowel movements. Prescription medicines may be prescribed if over-the-counter medicines do not work.  HOME CARE INSTRUCTIONS   Eat foods that have a lot of fiber, such as fruits, vegetables, whole grains, and beans.  Limit foods high in fat and processed sugars, such as french fries, hamburgers, cookies, candies, and soda.   A fiber supplement may be added to your diet if you cannot get enough fiber from foods.   Drink enough fluids to keep your urine clear or pale yellow.   Exercise regularly or as directed by your health care provider.   Go to the restroom when you have the urge to go. Do not hold it.   Only take over-the-counter or prescription medicines as directed by your health care provider. Do not take other medicines for constipation without talking to your health care provider first.  [place] IF:   You have bright red blood in your stool.   Your constipation lasts for more than 4 days or gets worse.   You have abdominal or rectal pain.   You have thin, pencil-like stools.   You have unexplained weight loss. MAKE SURE YOU:   Understand these instructions.  Will watch your condition.  Will get help right away if  you are not doing well or get worse.   This information is not intended to replace advice given to you by your health care provider. Make sure you discuss any questions you have with your health care provider.   Document Released: 02/13/2004 Document Revised: 06/07/2014 Document Reviewed: 02/26/2013 Elsevier Interactive Patient Education 2016 Elsevier Inc.  Urinary Tract Infection Urinary tract  infections (UTIs) can develop anywhere along your urinary tract. Your urinary tract is your body's drainage system for removing wastes and extra water. Your urinary tract includes two kidneys, two ureters, a bladder, and a urethra. Your kidneys are a pair of bean-shaped organs. Each kidney is about the size of your fist. They are located below your ribs, one on each side of your spine. CAUSES Infections are caused by microbes, which are microscopic organisms, including fungi, viruses, and bacteria. These organisms are so small that they can only be seen through a microscope. Bacteria are the microbes that most commonly cause UTIs. SYMPTOMS  Symptoms of UTIs may vary by age and gender of the patient and by the location of the infection. Symptoms in young women typically include a frequent and intense urge to urinate and a painful, burning feeling in the bladder or urethra during urination. Older women and men are more likely to be tired, shaky, and weak and have muscle aches and abdominal pain. A fever may mean the infection is in your kidneys. Other symptoms of a kidney infection include pain in your back or sides below the ribs, nausea, and vomiting. DIAGNOSIS To diagnose a UTI, your caregiver will ask you about your symptoms. Your caregiver will also ask you to provide a urine sample. The urine sample will be tested for bacteria and white blood cells. White blood cells are made by your body to help fight infection. TREATMENT  Typically, UTIs can be treated with medication. Because most UTIs are caused by a bacterial infection, they usually can be treated with the use of antibiotics. The choice of antibiotic and length of treatment depend on your symptoms and the type of bacteria causing your infection. HOME CARE INSTRUCTIONS  If you were prescribed antibiotics, take them exactly as your caregiver instructs you. Finish the medication even if you feel better after you have only taken some of the  medication.  Drink enough water and fluids to keep your urine clear or pale yellow.  Avoid caffeine, tea, and carbonated beverages. They tend to irritate your bladder.  Empty your bladder often. Avoid holding urine for long periods of time.  Empty your bladder before and after sexual intercourse.  After a bowel movement, women should cleanse from front to back. Use each tissue only once. SEEK MEDICAL CARE IF:   You have back pain.  You develop a fever.  Your symptoms do not begin to resolve within 3 days. SEEK IMMEDIATE MEDICAL CARE IF:   You have severe back pain or lower abdominal pain.  You develop chills.  You have nausea or vomiting.  You have continued burning or discomfort with urination. MAKE SURE YOU:   Understand these instructions.  Will watch your condition.  Will get help right away if you are not doing well or get worse.   This information is not intended to replace advice given to you by your health care provider. Make sure you discuss any questions you have with your health care provider.   Document Released: 02/24/2005 Document Revised: 02/05/2015 Document Reviewed: 06/25/2011 Elsevier Interactive Patient Education [employer].

## 2015-05-15 NOTE — Progress Notes (Signed)
Subjective:    Patient ID: Alison Zimmerman, female    DOB: 29-Sep-1950, 64 y.o.   MRN: [id number]  HPI Chief Complaint  Patient presents with  . bladder infection    possible bladder infection, right side pain and red and inflammed. low back pain. vomitting last night    She is a pleasant 64 year old with history of breast cancer here with complaints of urinary frequency, urgency, and difficulty emptying bladder and right flank pain intermittent for about a week. Flank pain worse with movement and flank is tender.  States pain radiates to the groin. Also reports chills last evening with vomiting, 3 episodes. Also reports having hard stools for past 3 days and states only passing small amounts. Last bowel movement 5am. She states she is passing gas.  No diarrhea, blood or pus in stool. No additional abdominal pain. States she has been doing "juicing" to treat her breast cancer for more than 6 months and has no plans to follow up with Oncologist. No recent diet changes. Denies history of constipation, bowel obstruction, or abdominal surgery. States she had colonoscopy approximately 2 years ago.  Denies nausea or abdominal pain in office.  She states she thinks she has a urinary tract infection. States last UTI was 2 years ago with similar symptoms.   She states she ate dinner last night.  Denies alcohol, smoking, or drug use. No recent illness or surgery.    reviewed allergies, medications, past medical, surgical and social history.  Review of Systems  pertinent positives and negatives in the history of present illness.    Objective:   Physical Exam BP 110/68 mmHg  Pulse 60  Temp(Src) 98.3 F (36.8 C) (Oral)  Wt 117 lb 9.6 oz (53.343 kg) Alert and in no distress.  Pharyngeal area is normal. Neck is supple without adenopathy or thyromegaly. Cardiac exam shows a regular sinus rhythm without murmurs or gallops. Lungs are clear to auscultation.  Abdomen soft, non distended with mild  tenderness to RLQ otherwise non tender, no rebound, guarding, referred pain no hyper paresthesia. Bowel sounds decreased. No CVAT.  Negative murphys.    urinalysis dipstick positive for leukocytes and trace of blood.  Microscopic urine leukocytes and bacteria TNTC,  Less than 2 red blood cells per field.    Assessment & Plan:  Urinary frequency - Plan: POCT urinalysis dipstick, CBC with Differential/Platelet, POCT UA - Microscopic Only, sulfamethoxazole-trimethoprim (BACTRIM DS,SEPTRA DS) 800-160 MG tablet  Right lower quadrant abdominal pain - Plan: CBC with Differential/Platelet, Comprehensive metabolic panel, POCT UA - Microscopic Only  Right flank pain - Plan: CBC with Differential/Platelet, Comprehensive metabolic panel, POCT UA - Microscopic Only  Constipation, unspecified constipation type  Leukocytes in urine - Plan: sulfamethoxazole-trimethoprim (BACTRIM DS,SEPTRA DS) 800-160 MG tablet  Non-intractable vomiting with nausea, unspecified vomiting type - Plan: ondansetron (ZOFRAN) 4 MG tablet  Discussed patient with Dr. Redmond School and agreed with treatment plan. Discussed with plan that we will treat her urinary infection and that her right flank pain and tenderness appears to be unrelated and possibly musculoskeletal due to movement causing the pain.  Discussed treating flank pain with over-the-counter anti-inflammatories or Tylenol.  Discussed staying well hydrated and adding fiber to her diet and also trying Metamucil or some sort of over-the-counter supplement to get her bowels moving.  Discussed that if she is not able to past gas or stool , develops abdominal pain , vomiting or any other worrisome symptoms that she should seek medical attention.  Discussed signs symptoms of bowel obstruction.  Also on the list of differentials but highly unlikely is appendicitis. Will follow up pending labs. She will let us know if not feeling better in 2-3 days. Zofran prescribed for nausea.  Recommend  that she call her oncologist and schedule an appointment, she agreed to do this.

## 2015-05-16 ENCOUNTER — Encounter: Payer: Self-pay | Admitting: Family Medicine

## 2015-05-16 ENCOUNTER — Ambulatory Visit (INDEPENDENT_AMBULATORY_CARE_PROVIDER_SITE_OTHER): Payer: No Typology Code available for payment source | Admitting: Family Medicine

## 2015-05-16 ENCOUNTER — Inpatient Hospital Stay (HOSPITAL_COMMUNITY)
Admission: EM | Admit: 2015-05-16 | Discharge: 2015-05-24 | DRG: 871 | Disposition: A | Discharge status: Home / Self Care | Payer: Medicare Other | Attending: Internal Medicine | Admitting: Internal Medicine

## 2015-05-16 ENCOUNTER — Emergency Department (HOSPITAL_COMMUNITY): Payer: Medicare Other

## 2015-05-16 ENCOUNTER — Encounter (HOSPITAL_COMMUNITY): Payer: Self-pay | Admitting: Emergency Medicine

## 2015-05-16 VITALS — BP 110/72 | HR 64 | Temp 97.8°F | Resp 24 | Wt 121.6 lb

## 2015-05-16 DIAGNOSIS — A4151 Sepsis due to Escherichia coli [E. coli]: Principal | ICD-10-CM | POA: Diagnosis present

## 2015-05-16 DIAGNOSIS — R7881 Bacteremia: Secondary | ICD-10-CM | POA: Diagnosis not present

## 2015-05-16 DIAGNOSIS — D6959 Other secondary thrombocytopenia: Secondary | ICD-10-CM | POA: Diagnosis present

## 2015-05-16 DIAGNOSIS — Z823 Family history of stroke: Secondary | ICD-10-CM

## 2015-05-16 DIAGNOSIS — A419 Sepsis, unspecified organism: Secondary | ICD-10-CM

## 2015-05-16 DIAGNOSIS — D638 Anemia in other chronic diseases classified elsewhere: Secondary | ICD-10-CM | POA: Diagnosis not present

## 2015-05-16 DIAGNOSIS — R945 Abnormal results of liver function studies: Secondary | ICD-10-CM

## 2015-05-16 DIAGNOSIS — R51 Headache: Secondary | ICD-10-CM

## 2015-05-16 DIAGNOSIS — N170 Acute kidney failure with tubular necrosis: Secondary | ICD-10-CM | POA: Diagnosis present

## 2015-05-16 DIAGNOSIS — D72829 Elevated white blood cell count, unspecified: Secondary | ICD-10-CM | POA: Diagnosis present

## 2015-05-16 DIAGNOSIS — R109 Unspecified abdominal pain: Secondary | ICD-10-CM

## 2015-05-16 DIAGNOSIS — E876 Hypokalemia: Secondary | ICD-10-CM | POA: Diagnosis present

## 2015-05-16 DIAGNOSIS — R7989 Other specified abnormal findings of blood chemistry: Secondary | ICD-10-CM

## 2015-05-16 DIAGNOSIS — E872 Acidosis, unspecified: Secondary | ICD-10-CM

## 2015-05-16 DIAGNOSIS — D630 Anemia in neoplastic disease: Secondary | ICD-10-CM | POA: Diagnosis present

## 2015-05-16 DIAGNOSIS — G43909 Migraine, unspecified, not intractable, without status migrainosus: Secondary | ICD-10-CM | POA: Diagnosis present

## 2015-05-16 DIAGNOSIS — R6520 Severe sepsis without septic shock: Secondary | ICD-10-CM | POA: Diagnosis present

## 2015-05-16 DIAGNOSIS — B9620 Unspecified Escherichia coli [E. coli] as the cause of diseases classified elsewhere: Secondary | ICD-10-CM | POA: Diagnosis present

## 2015-05-16 DIAGNOSIS — N179 Acute kidney failure, unspecified: Secondary | ICD-10-CM | POA: Diagnosis present

## 2015-05-16 DIAGNOSIS — A4181 Sepsis due to Enterococcus: Secondary | ICD-10-CM | POA: Diagnosis not present

## 2015-05-16 DIAGNOSIS — C50919 Malignant neoplasm of unspecified site of unspecified female breast: Secondary | ICD-10-CM | POA: Diagnosis not present

## 2015-05-16 DIAGNOSIS — N12 Tubulo-interstitial nephritis, not specified as acute or chronic: Secondary | ICD-10-CM | POA: Diagnosis present

## 2015-05-16 DIAGNOSIS — D696 Thrombocytopenia, unspecified: Secondary | ICD-10-CM | POA: Diagnosis present

## 2015-05-16 DIAGNOSIS — D0511 Intraductal carcinoma in situ of right breast: Secondary | ICD-10-CM | POA: Diagnosis present

## 2015-05-16 DIAGNOSIS — R519 Headache, unspecified: Secondary | ICD-10-CM

## 2015-05-16 DIAGNOSIS — E871 Hypo-osmolality and hyponatremia: Secondary | ICD-10-CM | POA: Diagnosis present

## 2015-05-16 DIAGNOSIS — N136 Pyonephrosis: Secondary | ICD-10-CM | POA: Diagnosis present

## 2015-05-16 DIAGNOSIS — Z87442 Personal history of urinary calculi: Secondary | ICD-10-CM

## 2015-05-16 LAB — I-STAT CG4 LACTIC ACID, ED
Lactic Acid, Venous: 9.68 mmol/L (ref 0.5–2.0)
Lactic Acid, Venous: 6.17 mmol/L (ref 0.5–2.0)

## 2015-05-16 LAB — URINALYSIS, ROUTINE W REFLEX MICROSCOPIC
GLUCOSE, UA: NEGATIVE mg/dL
KETONES UR: NEGATIVE mg/dL
Nitrite: NEGATIVE
PROTEIN: 100 mg/dL — AB
Specific Gravity, Urine: 1.024 (ref 1.005–1.030)
pH: 5 (ref 5.0–8.0)

## 2015-05-16 LAB — CBC WITH DIFFERENTIAL/PLATELET
BASOS ABS: 0 10*3/uL (ref 0.0–0.1)
BASOS PCT: 0 %
Basophils Absolute: 0 10*3/uL (ref 0.0–0.1)
Basophils Relative: 0 % (ref 0–1)
EOS ABS: 0 10*3/uL (ref 0.0–0.7)
EOS PCT: 0 %
Eosinophils Absolute: 0 10*3/uL (ref 0.0–0.7)
Eosinophils Relative: 0 % (ref 0–5)
HCT: 38.3 % (ref 36.0–46.0)
HCT: 39 % (ref 36.0–46.0)
Hemoglobin: 12.8 g/dL (ref 12.0–15.0)
Hemoglobin: 13.4 g/dL (ref 12.0–15.0)
LYMPHS ABS: 0.4 10*3/uL — AB (ref 0.7–4.0)
Lymphocytes Relative: 1 % — ABNORMAL LOW (ref 12–46)
Lymphocytes Relative: 2 %
Lymphs Abs: 0.3 10*3/uL — ABNORMAL LOW (ref 0.7–4.0)
MCH: 30.3 pg (ref 26.0–34.0)
MCH: 31.3 pg (ref 26.0–34.0)
MCHC: 33.4 g/dL (ref 30.0–36.0)
MCHC: 34.4 g/dL (ref 30.0–36.0)
MCV: 90.8 fL (ref 78.0–100.0)
MCV: 91.1 fL (ref 78.0–100.0)
MONO ABS: 1.2 10*3/uL — AB (ref 0.1–1.0)
MONO ABS: 0.2 10*3/uL (ref 0.1–1.0)
MONOS PCT: 4 % (ref 3–12)
MPV: 10.3 fL (ref 8.6–12.4)
Monocytes Relative: 1 %
NEUTROS ABS: 28.7 10*3/uL — AB (ref 1.7–7.7)
NEUTROS ABS: 17.8 10*3/uL — AB (ref 1.7–7.7)
Neutrophils Relative %: 95 % — ABNORMAL HIGH (ref 43–77)
Neutrophils Relative %: 97 %
PLATELETS: 183 10*3/uL (ref 150–400)
PLATELETS: 84 10*3/uL — AB (ref 150–400)
RBC: 4.22 MIL/uL (ref 3.87–5.11)
RBC: 4.28 MIL/uL (ref 3.87–5.11)
RDW: 12.6 % (ref 11.5–15.5)
RDW: 12.7 % (ref 11.5–15.5)
WBC Morphology: INCREASED
WBC: 30.2 10*3/uL (ref 4.0–10.5)
WBC: 18.4 10*3/uL — ABNORMAL HIGH (ref 4.0–10.5)

## 2015-05-16 LAB — COMPREHENSIVE METABOLIC PANEL
ALK PHOS: 240 U/L — AB (ref 38–126)
ALT: 40 U/L (ref 14–54)
AST: 67 U/L — AB (ref 15–41)
Albumin: 3.9 g/dL (ref 3.5–5.0)
Anion gap: 19 — ABNORMAL HIGH (ref 5–15)
BUN: 38 mg/dL — AB (ref 6–20)
CALCIUM: 8.1 mg/dL — AB (ref 8.9–10.3)
CHLORIDE: 90 mmol/L — AB (ref 101–111)
CO2: 18 mmol/L — AB (ref 22–32)
CREATININE: 3.32 mg/dL — AB (ref 0.44–1.00)
GFR calc non Af Amer: 14 mL/min — ABNORMAL LOW (ref >60)
GFR, EST AFRICAN AMERICAN: 16 mL/min — AB (ref >60)
Glucose, Bld: 118 mg/dL — ABNORMAL HIGH (ref 65–99)
Potassium: 3 mmol/L — ABNORMAL LOW (ref 3.5–5.1)
SODIUM: 127 mmol/L — AB (ref 135–145)
Total Bilirubin: 1.3 mg/dL — ABNORMAL HIGH (ref 0.3–1.2)
Total Protein: 7.4 g/dL (ref 6.5–8.1)

## 2015-05-16 LAB — URINE MICROSCOPIC-ADD ON

## 2015-05-16 MED ORDER — SODIUM CHLORIDE 0.9 % IV BOLUS (SEPSIS)
1000.0000 mL | Freq: Once | INTRAVENOUS | Status: AC
  Administered 2015-05-16: 1000 mL via INTRAVENOUS

## 2015-05-16 MED ORDER — POTASSIUM CHLORIDE 10 MEQ/100ML IV SOLN
10.0000 meq | INTRAVENOUS | Status: AC
  Administered 2015-05-16 – 2015-05-17 (×3): 10 meq via INTRAVENOUS
  Filled 2015-05-16 (×3): qty 100

## 2015-05-16 MED ORDER — DEXTROSE 5 % IV SOLN
1.0000 g | Freq: Once | INTRAVENOUS | Status: AC
  Administered 2015-05-16: 1 g via INTRAVENOUS
  Filled 2015-05-16: qty 10

## 2015-05-16 MED ORDER — ACETAMINOPHEN 500 MG PO TABS
1000.0000 mg | ORAL_TABLET | Freq: Once | ORAL | Status: AC
  Administered 2015-05-16: 1000 mg via ORAL
  Filled 2015-05-16: qty 2

## 2015-05-16 MED ORDER — ONDANSETRON HCL 4 MG/2ML IJ SOLN
4.0000 mg | Freq: Once | INTRAMUSCULAR | Status: AC
  Administered 2015-05-16: 4 mg via INTRAVENOUS
  Filled 2015-05-16: qty 2

## 2015-05-16 NOTE — Progress Notes (Signed)
   Subjective:    Patient ID: Alison Zimmerman, female    DOB: 06/01/1950, 64 y.o.   MRN: [id number]  HPI She is here for a recheck. She was seen yesterday and treated for a UTI and placed on Septra. Blood work was done and the white count was 30.2. She was called this morning and stated she was doing better but shortly after that she developed if occult he was vomiting, diarrhea, some shortness of breath and tachypnea.   Review of Systems     Objective:   Physical Exam Alert and toxic appearing. She is tachypnea. Lungs are clear to auscultation. Some slight right-sided chest wall and flank tenderness to palpation.       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

## 2015-05-16 NOTE — Consult Note (Signed)
Urology Consult  Referring physician: Toy Baker, MD  Reason for referral: Right ureteral stones in the setting of sepsis of urinary origin  Chief Complaint: Right ureteral stones in the setting of sepsis of urinary origin  History of Present Illness:  64 yo F presenting to the ER with urinary frequency, urgency, subjective fevers and 1 month history of intermittent right flank pain. The urinary symptoms and subjective fevers have been going on for almost a week. She also reports chills with nausea and vomiting. She reports subjective fevers at home  But did not measure her temperature. She reports burning in her back on the right.   Yesterday patient was seen at her primary care doctor office was started on Bactrim for presumed UTI. On arrival to the ER she was tachycardic to 132,  initially hypotensive down to 74/53 which has improved with aggressive fluid resuscitation with her latest blood pressures being 114/70. Her WBC was elevated to 18.4 and lactic acid initially up to 9.67 which came down with IV fluids down to 6.17.  Her creatinine increased from 1.2 yesterday for today to 3.32. UA grossly positive. She underwent a CT A/P which demonstrated a right 15 x 7 mm mid-ureteral stone and a second 5 mm stone just distal to this and severe hydroureteronephrosis.   She denies any prior history of nephrolithiasis and urologic surgery. She denies a history of diabetes and any cardiopulmonary disease. Denies any blood thinners. She last ate a 2 pm on 05/16/15.   Past Medical History  Diagnosis Date  . Skin complaints     sees Dr. Allyson Sabal  . History of kidney stones     Dr. Gaynelle Arabian, [facility]  . Wears glasses   . Migraine   . H/O bone density study     remote past  . History of mammogram     remote past  . 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 Anxiety   . Wears glasses   . Cancer [facility])    Past Surgical History   Procedure Laterality Date  . Tubal ligation    . Bunionectomy  2000    Dr. Gwyneth Revels, right  . Colonoscopy  1/15    never  . Orif femur fracture      age 60yrleft    Medications: I have reviewed the patient's current medications. Allergies:  Allergies  Allergen Reactions  . Other     No blood products. 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    Family History  Problem Relation Age of Onset  . Stroke Mother   . Stroke Father   . Cancer Neg Hx   . Diabetes Neg Hx   . Heart disease Neg Hx   . Hypertension Neg Hx    Social History:  reports that she has never smoked. She does not have any smokeless tobacco history on file. She reports that she does not drink alcohol or use illicit drugs.  ROS 12 system review was performed and was negative except for the pertinent positives listed in the HPI.  Physical Exam:  Vital signs in last 24 hours: Temp:  [97.4 F (36.3 C)-97.9 F (36.6 C)] 97.8 F (36.6 C) (12/16 2122) Pulse Rate:  [64-132] 118 (12/16 2300) Resp:  [16-27] 23 (12/16 2215) BP: (74-126)/(52-85) 126/75 mmHg (12/16 2300) SpO2:  [96 %-100 %] 96 % (12/16 2300) Weight:  [55.157 kg (121 lb 9.6 oz)] 55.157 kg (121 lb 9.6 oz) (12/16 1550)  Physical Exam General:alert, cooperative and appears stated age, no acute distress GI: soft, right upper quadrant mildly tender to palpation, non-distended.  Back: +Right CVA tenderness Extremities: extremities normal, atraumatic, no cyanosis or edema  Laboratory Data:  Results for orders placed or performed during the hospital encounter of 05/16/15 (from the past 72 hour(s))  Comprehensive metabolic panel     Status: Abnormal   Collection Time: 05/16/15  5:11 PM  Result Value Ref Range   Sodium 127 (L) 135 - 145 mmol/L   Potassium 3.0 (L) 3.5 - 5.1 mmol/L   Chloride 90 (L) 101 - 111 mmol/L   CO2 18 (L) 22 - 32 mmol/L   Glucose, Bld 118 (H) 65 - 99 mg/dL   BUN 38 (H) 6 - 20 mg/dL   Creatinine, Ser 3.32 (H) 0.44 - 1.00 mg/dL   Calcium 8.1 (L) 8.9  - 10.3 mg/dL   Total Protein 7.4 6.5 - 8.1 g/dL   Albumin 3.9 3.5 - 5.0 g/dL   AST 67 (H) 15 - 41 U/L   ALT 40 14 - 54 U/L   Alkaline Phosphatase 240 (H) 38 - 126 U/L   Total Bilirubin 1.3 (H) 0.3 - 1.2 mg/dL   GFR calc non Af Amer 14 (L) >60 mL/min   GFR calc Af Amer 16 (L) >60 mL/min    Comment: (NOTE) The eGFR has been calculated using the CKD EPI equation. This calculation has not been validated in all clinical situations. eGFR's persistently <60 mL/min signify possible Chronic Kidney Disease.    Anion gap 19 (H) 5 - 15  CBC WITH DIFFERENTIAL     Status: Abnormal   Collection Time: 05/16/15  5:11 PM  Result Value Ref Range   WBC 18.4 (H) 4.0 - 10.5 K/uL   RBC 4.28 3.87 - 5.11 MIL/uL   Hemoglobin 13.4 12.0 - 15.0 g/dL   HCT 39.0 36.0 - 46.0 %   MCV 91.1 78.0 - 100.0 fL   MCH 31.3 26.0 - 34.0 pg   MCHC 34.4 30.0 - 36.0 g/dL   RDW 12.7 11.5 - 15.5 %   Platelets 84 (L) 150 - 400 K/uL    Comment: RESULT REPEATED AND VERIFIED SPECIMEN CHECKED FOR CLOTS PLATELET COUNT CONFIRMED BY SMEAR    Neutrophils Relative % 97 %   Lymphocytes Relative 2 %   Monocytes Relative 1 %   Eosinophils Relative 0 %   Basophils Relative 0 %   Neutro Abs 17.8 (H) 1.7 - 7.7 K/uL   Lymphs Abs 0.4 (L) 0.7 - 4.0 K/uL   Monocytes Absolute 0.2 0.1 - 1.0 K/uL   Eosinophils Absolute 0.0 0.0 - 0.7 K/uL   Basophils Absolute 0.0 0.0 - 0.1 K/uL   WBC Morphology INCREASED BANDS (>20% BANDS)     Comment: MILD LEFT SHIFT (1-5% METAS, OCC MYELO, OCC BANDS)  I-Stat CG4 Lactic Acid, ED  (not at  [facility])     Status: Abnormal   Collection Time: 05/16/15  5:21 PM  Result Value Ref Range   Lactic Acid, Venous 9.68 (HH) 0.5 - 2.0 mmol/L   Comment NOTIFIED PHYSICIAN   Urinalysis, Routine w reflex microscopic (not at [facility])     Status: Abnormal   Collection Time: 05/16/15  5:28 PM  Result Value Ref Range   Color, Urine AMBER (A) YELLOW    Comment: BIOCHEMICALS MAY BE AFFECTED BY COLOR   APPearance TURBID (A) CLEAR    Specific Gravity, Urine 1.024 1.005 - 1.030   pH 5.0 5.0 -  8.0   Glucose, UA NEGATIVE NEGATIVE mg/dL   Hgb urine dipstick LARGE (A) NEGATIVE   Bilirubin Urine MODERATE (A) NEGATIVE   Ketones, ur NEGATIVE NEGATIVE mg/dL   Protein, ur 100 (A) NEGATIVE mg/dL   Nitrite NEGATIVE NEGATIVE   Leukocytes, UA LARGE (A) NEGATIVE  Urine microscopic-add on     Status: Abnormal   Collection Time: 05/16/15  5:28 PM  Result Value Ref Range   Squamous Epithelial / LPF 0-5 (A) NONE SEEN   WBC, UA TOO NUMEROUS TO COUNT 0 - 5 WBC/hpf   RBC / HPF 6-30 0 - 5 RBC/hpf   Bacteria, UA MANY (A) NONE SEEN  I-Stat CG4 Lactic Acid, ED  (not at  [facility])     Status: Abnormal   Collection Time: 05/16/15  8:26 PM  Result Value Ref Range   Lactic Acid, Venous 6.17 (HH) 0.5 - 2.0 mmol/L   Comment NOTIFIED PHYSICIAN    No results found for this or any previous visit (from the past 240 hour(s)). Creatinine:  Recent Labs  05/15/15 0001 05/16/15 1711  CREATININE 1.24* 3.32*   Impression/Assessment:  64 yo F presenting with a very large right 15 x 7 mm mid-ureteral stone and a second 5 mm stone just distal to this and severe hydroureteronephrosis in the setting of sepsis of urinary origin. Persistent mild tachycardia despite aggressive fluid resuscitation and improved hypotension and subjective fevers at home.    Plan:  1. Agree with admit to monitored bed in stepdown unit 2. Given the very large and likely chronic nature of the right mid-ureteral stone and signs of urosepsis do not recommend attempted right ureteral stent placement as this is unlikely to be successful and would be associated with a high risk of ureteral perforation or injury, worsening sepsis and severe hypotension with induction of general anesthesia. Given this, recommend proceeding with urgent right nephrostomy tube placement tonight vs. Tomorrow if she remains stable overnight. 3. If becomes increasingly tachycardic, down-trending BP or  hypotension or develops a true or borderline fever of 101.5 strongly recommend proceeding with emergent right nephrostomy tube placement tonight 4. This was discussed with Interventional Radiologist on call who is in agreement with plan to proceed with right nephrostomy tube placement in AM but if worsening signs of sepsis or instability to proceed emergently tonight 5. Recommend broad spectrum IV antibiotics consisting of Ceftazidime and Zosyn (renally dosed given AKI) 6. Recommend foley catheter placement for complete decompression of urinary system given urosepsis 7. Send stat INR/PTT for NT placement 8. NPO now 9. Continued aggressive hydration and resucitation per primary team 10. In the future she will need outpatient evaluation at [facility] with Dr. Pilar Jarvis for discussion of definitive treatment of the right ureteral stones after she has been fully treated for her urosepsis.  Acie Fredrickson 05/16/2015, 11:54 PM

## 2015-05-16 NOTE — ED Notes (Signed)
Pt was seen yesterday and diagnosed with bladder infection. Having right lower flank pain with no associated dysuria. Given Septra (Bactrim)-took dosage today. Emesis x1 today. Unsure if she has had fevers but endorses chills. Pt is tachypneic and WBC reported at 30.2. Also c/o right upper chest pain. Also c/o of diarrhea. C/o dizziness and inability to "keep balance." A&Ox4. Ambulatory with slow gait. Pt referred here by MD [employer].

## 2015-05-16 NOTE — ED Notes (Signed)
Bed: WA13 Expected date:  Expected time:  Means of arrival:  Comments: HOLD 

## 2015-05-16 NOTE — H&P (Addendum)
PCP:  [name], MD  Oncology [place]  Referring provider [facility]   Chief Complaint:     HPI: Alison Zimmerman is a 64 y.o. female   has a past medical history of Skin complaints; History of kidney stones; Wears glasses; Migraine; H/O bone density study; History of mammogram; Ductal carcinoma in situ of breast (08/17/13); Ductal carcinoma in situ of breast (08/17/13); Anxiety; Wears glasses; and Cancer ([place]).   Presented with  Urinary frequency and urgency and right flank pain. The symptoms have been going on for almost a week. Chills with vomiting. Reports some constipation associated with that. Fever at home subjectively she did not measure it. She reports burning in her back on the right.  Yesterday patient was seen at her primary care doctor office was started on Bactrim for presumed UTI Patient presented to emergency department today was found to have white blood cell count of 18.4 heart rate up to 132 initially hypotensive down to 74/53 which has improved with aggressive fluid resuscitation 04 L latest blood pressures being 103/63 patient was found to have elevated lactic acid initially up to 9.67 which has come down with IV fluids down to 6.17. Patient creatinine has been rising from yesterday 1.2 for today to 3.32   Of note patient has known history of breast cancer diagnosed apparently 2015 patient refused a lumpectomy States she is choosing to treated currently with natural methods and does not want to follow-up with oncology or surgery.  Patient says to me she forgot that she has breast cancer. States she tries not to think about it.  Patient wearing depends but denies any incontinence able to stand up on her own to use the bathroom   Sepsis - Repeat Assessment  Performed at:    11:27PM  Vitals     Blood pressure 126/75, pulse 118, temperature 97.8 F (36.6 C), temperature source Oral, resp. rate 23, SpO2 96 %.  Heart:     Regular rate and  rhythm  Lungs:    CTA  Capillary Refill:   > 2 sec  Peripheral Pulse:   Radial pulse palpable  Skin:     Normal Color   patient is improving vital Stines improving blood pressure now 126/75 heart rate remains elevated 118 CT scan now showing evidence of hydronephrosis secondary to nephrolithiasis. Urology has been consulted 11:32 PM    Hospitalist was called for admission for severe sepsis in a setting of UTI  Review of Systems:    Pertinent positives include: Fevers, chills, fatigue, dysuria urgency or frequency.   straining to urinate.    flank pain.   Constitutional:  No weight loss, night sweats,  weight loss  HEENT:  No headaches, Difficulty swallowing,Tooth/dental problems,Sore throat,  No sneezing, itching, ear ache, nasal congestion, post nasal drip,  Cardio-vascular:  No chest pain, Orthopnea, PND, anasarca, dizziness, palpitations.no Bilateral lower extremity swelling  GI:  No heartburn, indigestion, abdominal pain, nausea, vomiting, diarrhea, change in bowel habits, loss of appetite, melena, blood in stool, hematemesis Resp:  no shortness of breath at rest. No dyspnea on exertion, No excess mucus, no productive cough, No non-productive cough, No coughing up of blood.No change in color of mucus.No wheezing. Skin:  no rash or lesions. No jaundice GU:   Musculoskeletal:  No joint pain or no joint swelling. No decreased range of motion. No back pain.  Psych:  No change in mood or affect. No depression or anxiety. No memory loss.  Neuro: no localizing neurological complaints,  no tingling, no weakness, no double vision, no gait abnormality, no slurred speech, no confusion  Otherwise ROS are negative except for above, 10 systems were reviewed  Past Medical History: Past Medical History  Diagnosis Date  . Skin complaints     sees Dr. Allyson Sabal  . History of kidney stones     Dr. Gaynelle Arabian, [facility]  . Wears glasses   . Migraine   . H/O bone density study      remote past  . History of mammogram     remote past  . 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 Anxiety   . Wears glasses   . Cancer [facility])    Past Surgical History  Procedure Laterality Date  . Tubal ligation    . Bunionectomy  2000    Dr. Gwyneth Revels, right  . Colonoscopy  1/15    never  . Orif femur fracture      age 39yr-left     Medications: Prior to Admission medications   Medication Sig Start Date End Date Taking? Authorizing Provider  Diphenhydramine-Acetaminophen (PERCOGESIC PO) Take 1 tablet by mouth daily. Reported on 05/16/2015   Yes Historical Provider, MD  ondansetron (ZOFRAN) 4 MG tablet Take 1 tablet (4 mg total) by mouth every 8 (eight) hours as needed for nausea or vomiting. 05/15/15  Yes Girtha Rm, NP  sulfamethoxazole-trimethoprim (BACTRIM DS,SEPTRA DS) 800-160 MG tablet Take 1 tablet by mouth 2 (two) times daily. 05/15/15  Yes Girtha Rm, NP    Allergies:   Allergies  Allergen Reactions  . Other     No blood products. 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    Social History:  Ambulatory  independently   Lives at home  With family     reports that she has never smoked. She does not have any smokeless tobacco history on file. She reports that she does not drink alcohol or use illicit drugs.    Family History: family history includes Stroke in her father and mother. There is no history of Cancer, Diabetes, Heart disease, or Hypertension.    Physical Exam: Patient Vitals for the past 24 hrs:  BP Temp Temp src Pulse Resp SpO2  05/16/15 2122 121/85 mmHg 97.8 F (36.6 C) Oral 118 18 100 %  05/16/15 2115 121/85 mmHg - - (!) 132 19 100 %  05/16/15 2000 92/61 mmHg - - 117 22 98 %  05/16/15 1900 (!) 93/54 mmHg - - 116 20 100 %  05/16/15 1830 (!) 74/53 mmHg - - 118 (!) 27 100 %  05/16/15 1800 (!) 86/68 mmHg 97.4 F (36.3 C) Oral 118 21 99 %  05/16/15 1653 (!) 84/52 mmHg 97.9 F (36.6 C) Axillary (!)  127 16 100 %    1. General:  in No Acute distress 2. Psychological: Alert and Oriented 3. Head/ENT:      Dry Mucous Membranes                          Head Non traumatic, neck supple                          Normal   Dentition 4. SKIN:  decreased Skin turgor,  Skin clean Dry and intact no rash 5. Heart: Regular rate and rhythm no Murmur, Rub or gallop 6. Lungs: Clear to auscultation bilaterally, no wheezes or crackles  7. Abdomen: Soft, non-tender, Non distended 8. Lower extremities: no clubbing, cyanosis, or edema 9. Neurologically Grossly intact, moving all 4 extremities equally 10. MSK: Normal range of motion Breast exam significant for nodularity of right breast somewhat more pronounced. No significant nipple retraction noted   body mass index is unknown because there is no weight on file.   Labs on Admission:   Results for orders placed or performed during the hospital encounter of 05/16/15 (from the past 24 hour(s))  Comprehensive metabolic panel     Status: Abnormal   Collection Time: 05/16/15  5:11 PM  Result Value Ref Range   Sodium 127 (L) 135 - 145 mmol/L   Potassium 3.0 (L) 3.5 - 5.1 mmol/L   Chloride 90 (L) 101 - 111 mmol/L   CO2 18 (L) 22 - 32 mmol/L   Glucose, Bld 118 (H) 65 - 99 mg/dL   BUN 38 (H) 6 - 20 mg/dL   Creatinine, Ser 3.32 (H) 0.44 - 1.00 mg/dL   Calcium 8.1 (L) 8.9 - 10.3 mg/dL   Total Protein 7.4 6.5 - 8.1 g/dL   Albumin 3.9 3.5 - 5.0 g/dL   AST 67 (H) 15 - 41 U/L   ALT 40 14 - 54 U/L   Alkaline Phosphatase 240 (H) 38 - 126 U/L   Total Bilirubin 1.3 (H) 0.3 - 1.2 mg/dL   GFR calc non Af Amer 14 (L) >60 mL/min   GFR calc Af Amer 16 (L) >60 mL/min   Anion gap 19 (H) 5 - 15  CBC WITH DIFFERENTIAL     Status: Abnormal   Collection Time: 05/16/15  5:11 PM  Result Value Ref Range   WBC 18.4 (H) 4.0 - 10.5 K/uL   RBC 4.28 3.87 - 5.11 MIL/uL   Hemoglobin 13.4 12.0 - 15.0 g/dL   HCT 39.0 36.0 - 46.0 %   MCV 91.1 78.0 - 100.0 fL   MCH 31.3 26.0 -  34.0 pg   MCHC 34.4 30.0 - 36.0 g/dL   RDW 12.7 11.5 - 15.5 %   Platelets 84 (L) 150 - 400 K/uL   Neutrophils Relative % 97 %   Lymphocytes Relative 2 %   Monocytes Relative 1 %   Eosinophils Relative 0 %   Basophils Relative 0 %   Neutro Abs 17.8 (H) 1.7 - 7.7 K/uL   Lymphs Abs 0.4 (L) 0.7 - 4.0 K/uL   Monocytes Absolute 0.2 0.1 - 1.0 K/uL   Eosinophils Absolute 0.0 0.0 - 0.7 K/uL   Basophils Absolute 0.0 0.0 - 0.1 K/uL   WBC Morphology INCREASED BANDS (>20% BANDS)   I-Stat CG4 Lactic Acid, ED  (not at  [facility]     Status: Abnormal   Collection Time: 05/16/15  5:21 PM  Result Value Ref Range   Lactic Acid, Venous 9.68 (HH) 0.5 - 2.0 mmol/L   Comment NOTIFIED PHYSICIAN   Urinalysis, Routine w reflex microscopic (not at [facility])     Status: Abnormal   Collection Time: 05/16/15  5:28 PM  Result Value Ref Range   Color, Urine AMBER (A) YELLOW   APPearance TURBID (A) CLEAR   Specific Gravity, Urine 1.024 1.005 - 1.030   pH 5.0 5.0 - 8.0   Glucose, UA NEGATIVE NEGATIVE mg/dL   Hgb urine dipstick LARGE (A) NEGATIVE   Bilirubin Urine MODERATE (A) NEGATIVE   Ketones, ur NEGATIVE NEGATIVE mg/dL   Protein, ur 100 (A) NEGATIVE mg/dL   Nitrite NEGATIVE NEGATIVE   Leukocytes, UA LARGE (  A) NEGATIVE  Urine microscopic-add on     Status: Abnormal   Collection Time: 05/16/15  5:28 PM  Result Value Ref Range   Squamous Epithelial / LPF 0-5 (A) NONE SEEN   WBC, UA TOO NUMEROUS TO COUNT 0 - 5 WBC/hpf   RBC / HPF 6-30 0 - 5 RBC/hpf   Bacteria, UA MANY (A) NONE SEEN  I-Stat CG4 Lactic Acid, ED  (not at  [facility])     Status: Abnormal   Collection Time: 05/16/15  8:26 PM  Result Value Ref Range   Lactic Acid, Venous 6.17 (HH) 0.5 - 2.0 mmol/L   Comment NOTIFIED PHYSICIAN     UA numerous to count white blood cells  No results found for: HGBA1C  CrCl cannot be calculated (Unknown ideal weight.).  BNP (last 3 results) No results for input(s): PROBNP in the last 8760 hours.  Other results:  I  have pearsonaly reviewed this: ECG REPORT  Rate:127  Rhythm: Sinus tachycardia  ST&T Change: No ischemic changes QTC 449  There were no vitals filed for this visit.   Cultures: No results found for: SDES, [place], CULT, REPTSTATUS   Radiological Exams on Admission: Dg Chest Portable 1 View  05/16/2015  CLINICAL DATA:  Pt was seen yesterday and diagnosed with bladder infection. Having right lower flank pain with no associated dysuria. Emesis x1 today.Pt is tachypneic and WBC reported at 30.2. Also c/o RUQ chest pain, diarrhea, and dizziness. Nonsmoker. EXAM: PORTABLE CHEST 1 VIEW COMPARISON:  07/26/2014 FINDINGS: Prior median sternotomy. Mild right hemidiaphragm elevation. Midline trachea. Borderline cardiomegaly. No pleural effusion or pneumothorax. Small calcified right lung base nodules are likely related to old granulomatous disease. IMPRESSION: No acute cardiopulmonary disease. Electronically Signed   By: [name] M.D.   On: 05/16/2015 18:01    Chart has been reviewed  Family not at  [place] [phone] Husband as per request of the patient left msg to call back to ER    Assessment/Plan  64 year old female with history of breast cancer currently not on any treatment secondary to patient refusing care presents with subjective fevers dysuria and back pain was found to have evidence of UTI and severe sepsis with lactic acid of 9.6 and hypotension with organ damage including acute renal failure.   Present on Admission:  . Severe sepsis with acute organ dysfunction ([place]) - Code sepsis has been called admit per sepsis protocol. CCM consult appreciated. Lactic acidosis with some response to IV fluids. Hypotension improved with IV fluid resuscitation. Patient clinically improving which is encouraging. Admit to step down, continue on Rocephin . Ductal carcinoma in situ (DCIS) of right breast - unstable dyspnea C appreciate father patient  is agreeable. I worry the patient in some degree of denial. Her disease is likely progressing without any medical intervention. if patient is interested would pursue father breast imaging to evaluate defers any increase in mass size.   hyponatremia  - possibly secondary to dehydration given illness. Will rehydrate check urine electrolytes  . Acute renal failure (ARF) (HCC) in the setting of severe sepsis. An urinary tract infection. Given flank pain will obtain renal protocol CT to rule out any nephrolithiasis or hydronephrosis  CT scan did show nephrolithiasis with hydronephrosis and obstruction. Discussed case with urology who will see patient in consult make patient nothing by mouth for possible stenting tonight    Discussed case with urology who at this point recommends nephrostomy tube placement by interventional radiology this can  be done early in the morning also and if patient decompensates recommend changing IV antibiotics to Zosyn and ceftazidime and foley placement  12:37 AM   hypokalemia will treat in check magnesium level   Prophylaxis:  SCD  CODE STATUS:  FULL CODE  as per patient   Disposition:  To home once workup is complete and patient is stable  Other plan as per orders.  I have spent a total of 70 min on this admission extra time was taken to discus case with Urology and patient family   Alison Zimmerman 05/16/2015, 12:37 AM  [facility]  Pager [phone]   after 2 AM please page floor coverage PA If 7AM-7PM, please contact the day team taking care of the patient  [email or web address]  Password [email or web address]

## 2015-05-16 NOTE — ED Notes (Signed)
INITIAL ASSESSMENT COMPLETED. PT C/O RIGHT FLANK PAIN WITH CHILLS. PT STATES SHE WAS SEEN BY HER PMD YESTERDAY AND GIVEN ABX FOR A UTI. AWAITING FURTHER ORDERS.

## 2015-05-16 NOTE — ED Notes (Signed)
PT WILL BE TRANSPORTED TO 1224-1 AND REPORT GIVEN AT BEDSIDE. AAOX3. PT IN NO APPARENT DISTRESS OR PAIN. IV POTASSIUM AND NS INFUSING W/O PAIN OR SWELLING. THE OPPORTUNITY TO ASK QUESTIONS WAS PROVIDED.

## 2015-05-16 NOTE — Consult Note (Signed)
Name: Alison Zimmerman MRN: [id number] DOB: 29-Aug-1950    ADMISSION DATE:  05/16/2015 CONSULTATION DATE:  05/16/15  REFERRING MD :  Roel Cluck  CHIEF COMPLAINT:  Flank pain  Reason for consultation: sepsis   HISTORY OF PRESENT ILLNESS:  Pt is a 64yo female presents to ED with flank pain and urinary frequence that have been going on for almost a week. They havebeen associated with chills, nausea, vomiting, subjective fever, and just feeling ill. She presented to her PCP yesterday and received bactrim for a UTI. She was seen in the ED and was hypotensive. She received ABx and 4L IVF and is currently feeling better with improved BP. Patient states that she began having diarrhea after she started taking Bactrim yesterday.   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  has past surgical history that includes Tubal ligation; Bunionectomy (2000); Colonoscopy (1/15); and ORIF femur fracture. Prior to Admission medications   Medication Sig Start Date End Date Taking? Authorizing Provider  Diphenhydramine-Acetaminophen (PERCOGESIC PO) Take 1 tablet by mouth daily. Reported on 05/16/2015   Yes Historical Provider, MD  ondansetron (ZOFRAN) 4 MG tablet Take 1 tablet (4 mg total) by mouth every 8 (eight) hours as needed for nausea or vomiting. 05/15/15  Yes Girtha Rm, NP  sulfamethoxazole-trimethoprim (BACTRIM DS,SEPTRA DS) 800-160 MG tablet Take 1 tablet by mouth 2 (two) times daily. 05/15/15  Yes Girtha Rm, NP   Allergies  Allergen Reactions  . Other     No blood products. 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    FAMILY HISTORY:  family history includes Stroke in her father and mother. There is no history of Cancer, Diabetes, Heart disease,  or Hypertension. SOCIAL HISTORY:  reports that she has never smoked. She does not have any smokeless tobacco history on file. She reports that she does not drink alcohol or use illicit drugs.  REVIEW OF SYSTEMS:   Constitutional: +for fever, chills,+ weight loss, +malaise/fatigue and -diaphoresis.  HENT: Negative for hearing loss, ear pain, nosebleeds, congestion, sore throat, neck pain, tinnitus and ear discharge.   Eyes: Negative for blurred vision, double vision, photophobia, pain, discharge and redness.  Respiratory: Negative for cough, hemoptysis, sputum production, shortness of breath, wheezing and stridor.   Cardiovascular: Negative for chest pain, palpitations, orthopnea, claudication, leg swelling and PND.  Gastrointestinal: Negative for heartburn, nausea, vomiting, abdominal pain,+ diarrhea,- constipation,- blood in stool and melena.  Genitourinary: +for dysuria, +urgency,+ frequency,- hematuria and+ flank pain.  Musculoskeletal: Negative for myalgias, +back pain, -joint pain and falls.  Skin: Negative for itching and rash.  Neurological: Negative for dizziness, tingling, tremors, sensory change, speech change, focal weakness, seizures, loss of consciousness, weakness and headaches.  Endo/Heme/Allergies: Negative for environmental allergies and polydipsia. Does not bruise/bleed easily.  SUBJECTIVE:   VITAL SIGNS: Temp:  [97.4 F (36.3 C)-97.9 F (36.6 C)] 97.8 F (36.6 C) (12/16 2122) Pulse Rate:  [64-132] 118 (12/16 2122) Resp:  [16-27] 18 (12/16 2122) BP: (74-121)/(52-85) 121/85 mmHg (12/16 2122) SpO2:  [98 %-100 %] 100 % (12/16 2122) Weight:  [55.157 kg (121 lb 9.6 oz)] 55.157 kg (121 lb 9.6 oz) (12/16 1550)  PHYSICAL EXAMINATION: General:  64yo african [place] female who appears her stated and is in no acute distress. She is  AAOx4.  Neuro:  Cranial nerves II-XII grossly intact. PERRL. EOMI. Sensation and strength intact. no focal deficits. Gait intact HEENT: Head,  normocephalic, atraumatic. Ears symmetric, permeable. Eyes: no conjunctival icterus, no erythema. Nose: permeable, midline septum,  Clear oropharynx Cardiovascular: S1S2 tachycardic no murmurs, rubs, or gallops auscultated. No thrills palpated.  Lungs:  Chest symmetrical with respirations, clear to auscultation bilaterally with no wheezing, crackles, equal expansion.  Abdomen:  Soft, nontender, no guarding, nondistended. Present bowel sounds. Musculoskeletal:  No muscle atrophy noted nor weakness. ROM intact. No swelling nor tenderness of the joints. +costovertebral tenderness Skin:  No notable scars, rashes, cruises. No bed sores noted.  Lymphatics: no palpable lymphadenopathy of cervical, supraclvicular nor inguinal areas.      Recent Labs Lab 05/15/15 0001 05/16/15 1711  NA 136 127*  K 3.9 3.0*  CL 100 90*  CO2 23 18*  BUN 21 38*  CREATININE 1.24* 3.32*  GLUCOSE 132* 118*    Recent Labs Lab 05/15/15 0001 05/16/15 1711  HGB 12.8 13.4  HCT 38.3 39.0  WBC 30.2* 18.4*  PLT 183 84*   Dg Chest Portable 1 View  05/16/2015  CLINICAL DATA:  Pt was seen yesterday and diagnosed with bladder infection. Having right lower flank pain with no associated dysuria. Emesis x1 today.Pt is tachypneic and WBC reported at 30.2. Also c/o RUQ chest pain, diarrhea, and dizziness. Nonsmoker. EXAM: PORTABLE CHEST 1 VIEW COMPARISON:  07/26/2014 FINDINGS: Prior median sternotomy. Mild right hemidiaphragm elevation. Midline trachea. Borderline cardiomegaly. No pleural effusion or pneumothorax. Small calcified right lung base nodules are likely related to old granulomatous disease. IMPRESSION: No acute cardiopulmonary disease. Electronically Signed   By: [name] M.D.   On: 05/16/2015 18:01    ASSESSMENT / PLAN: Severe sepsis 2/2 Pyelonephritis - leukocytosis, tachycardia, hypotension - s/p 4L IVF - agree with rocephin as antibiotic choice  - f/u CT scan ordered by hospitalist to evaluate for  stones - serial lactates to ensure clearance (has decreased from 9.68 to 6.17) - f/u blood cultures and urine culture, UA with many bacteria and large leukocytes. - thrombocytopenia is likely 2/2 sepsis - I performed an ultrasound evaluation of her IVC and she had no respiratory collapse/variability on her IVC. She appears to be adequately volume resuscitated. I have discussed the potential of placing a central line and initiating vasopressors should her blood pressure once again deteriorate.   Acute kidney injury likely 2/2 ATN from sepsis +/- bactrim - trend renal function - monitor UOP - monitor hyponatremia - anion gap metabolic acidosis likely 2/2 lactic acidosis and AKI - unremarkable renal function and lytes yesterday.  - replete hypokalemia  Critical care time spent evaluating patient, reviewing labs, imaging, IVF bolus', bedside US/Echo 32 minutes.   Randa Lynn, MD Critical Care Medicine [place] Pager: [phone]  05/16/2015, 10:27 PM

## 2015-05-16 NOTE — ED Notes (Addendum)
[facility] [phone]

## 2015-05-17 ENCOUNTER — Inpatient Hospital Stay (HOSPITAL_COMMUNITY): Payer: Medicare Other | Admitting: Anesthesiology

## 2015-05-17 ENCOUNTER — Encounter (HOSPITAL_COMMUNITY)
Admission: EM | Disposition: A | Discharge status: Home / Self Care | Payer: Self-pay | Source: Home / Self Care | Attending: Internal Medicine

## 2015-05-17 ENCOUNTER — Encounter (HOSPITAL_COMMUNITY): Payer: Self-pay | Admitting: Anesthesiology

## 2015-05-17 ENCOUNTER — Inpatient Hospital Stay (HOSPITAL_COMMUNITY): Payer: Medicare Other

## 2015-05-17 DIAGNOSIS — N1330 Unspecified hydronephrosis: Secondary | ICD-10-CM

## 2015-05-17 DIAGNOSIS — R7989 Other specified abnormal findings of blood chemistry: Secondary | ICD-10-CM

## 2015-05-17 DIAGNOSIS — N179 Acute kidney failure, unspecified: Secondary | ICD-10-CM

## 2015-05-17 DIAGNOSIS — E876 Hypokalemia: Secondary | ICD-10-CM

## 2015-05-17 DIAGNOSIS — A419 Sepsis, unspecified organism: Secondary | ICD-10-CM

## 2015-05-17 DIAGNOSIS — N390 Urinary tract infection, site not specified: Secondary | ICD-10-CM

## 2015-05-17 DIAGNOSIS — R6520 Severe sepsis without septic shock: Secondary | ICD-10-CM

## 2015-05-17 DIAGNOSIS — R319 Hematuria, unspecified: Secondary | ICD-10-CM

## 2015-05-17 HISTORY — PX: CYSTOSCOPY WITH STENT PLACEMENT: SHX5790

## 2015-05-17 LAB — OSMOLALITY, URINE: OSMOLALITY UR: 123 mosm/kg — AB (ref 300–900)

## 2015-05-17 LAB — CBC WITH DIFFERENTIAL/PLATELET
BASOS ABS: 0 10*3/uL (ref 0.0–0.1)
Basophils Relative: 0 %
EOS ABS: 0 10*3/uL (ref 0.0–0.7)
Eosinophils Relative: 0 %
HCT: 32.2 % — ABNORMAL LOW (ref 36.0–46.0)
HEMOGLOBIN: 11 g/dL — AB (ref 12.0–15.0)
LYMPHS ABS: 0.3 10*3/uL — AB (ref 0.7–4.0)
LYMPHS PCT: 2 %
MCH: 31.3 pg (ref 26.0–34.0)
MCHC: 34.2 g/dL (ref 30.0–36.0)
MCV: 91.5 fL (ref 78.0–100.0)
MONOS PCT: 1 %
Monocytes Absolute: 0.2 10*3/uL (ref 0.1–1.0)
Neutro Abs: 16.8 10*3/uL — ABNORMAL HIGH (ref 1.7–7.7)
Neutrophils Relative %: 97 %
Platelets: 66 10*3/uL — ABNORMAL LOW (ref 150–400)
RBC: 3.52 MIL/uL — ABNORMAL LOW (ref 3.87–5.11)
RDW: 12.9 % (ref 11.5–15.5)
WBC Morphology: INCREASED
WBC: 17.3 10*3/uL — AB (ref 4.0–10.5)

## 2015-05-17 LAB — COMPREHENSIVE METABOLIC PANEL
ALK PHOS: 99 U/L (ref 38–126)
ALT: 71 U/L — AB (ref 14–54)
AST: 92 U/L — ABNORMAL HIGH (ref 15–41)
Albumin: 2.8 g/dL — ABNORMAL LOW (ref 3.5–5.0)
Anion gap: 12 (ref 5–15)
BUN: 27 mg/dL — ABNORMAL HIGH (ref 6–20)
CALCIUM: 7.1 mg/dL — AB (ref 8.9–10.3)
CO2: 15 mmol/L — ABNORMAL LOW (ref 22–32)
CREATININE: 2.04 mg/dL — AB (ref 0.44–1.00)
Chloride: 111 mmol/L (ref 101–111)
GFR, EST AFRICAN AMERICAN: 28 mL/min — AB (ref >60)
GFR, EST NON AFRICAN AMERICAN: 24 mL/min — AB (ref >60)
Glucose, Bld: 103 mg/dL — ABNORMAL HIGH (ref 65–99)
Potassium: 4.4 mmol/L (ref 3.5–5.1)
Sodium: 138 mmol/L (ref 135–145)
Total Bilirubin: 1.1 mg/dL (ref 0.3–1.2)
Total Protein: 5.8 g/dL — ABNORMAL LOW (ref 6.5–8.1)

## 2015-05-17 LAB — CREATININE, URINE, RANDOM: CREATININE, URINE: 15.45 mg/dL

## 2015-05-17 LAB — APTT
aPTT: 35 seconds (ref 24–37)
aPTT: 38 seconds — ABNORMAL HIGH (ref 24–37)

## 2015-05-17 LAB — MAGNESIUM: MAGNESIUM: 1.5 mg/dL — AB (ref 1.7–2.4)

## 2015-05-17 LAB — LACTIC ACID, PLASMA
LACTIC ACID, VENOUS: 4.2 mmol/L — AB (ref 0.5–2.0)
LACTIC ACID, VENOUS: 3.3 mmol/L — AB (ref 0.5–2.0)
Lactic Acid, Venous: 2.7 mmol/L (ref 0.5–2.0)
Lactic Acid, Venous: 1.7 mmol/L (ref 0.5–2.0)

## 2015-05-17 LAB — PROTIME-INR
INR: 1.53 — AB (ref 0.00–1.49)
INR: 1.58 — ABNORMAL HIGH (ref 0.00–1.49)
PROTHROMBIN TIME: 18.9 s — AB (ref 11.6–15.2)
Prothrombin Time: 18.5 seconds — ABNORMAL HIGH (ref 11.6–15.2)

## 2015-05-17 LAB — SODIUM, URINE, RANDOM: SODIUM UR: 20 mmol/L

## 2015-05-17 LAB — PHOSPHORUS: Phosphorus: 2.7 mg/dL (ref 2.5–4.6)

## 2015-05-17 LAB — MRSA PCR SCREENING: MRSA by PCR: NEGATIVE

## 2015-05-17 LAB — PROCALCITONIN: PROCALCITONIN: 62.97 ng/mL

## 2015-05-17 LAB — TSH: TSH: 0.502 u[IU]/mL (ref 0.350–4.500)

## 2015-05-17 SURGERY — CYSTOSCOPY, WITH RETROGRADE PYELOGRAM AND URETERAL STENT INSERTION
Anesthesia: Choice | Laterality: Right

## 2015-05-17 SURGERY — CYSTOSCOPY, WITH STENT INSERTION
Anesthesia: General | Site: Ureter | Laterality: Right

## 2015-05-17 MED ORDER — POLYETHYLENE GLYCOL 3350 17 G PO PACK: 17.0000 g | PACK | Freq: Every day | ORAL | Status: DC | PRN

## 2015-05-17 MED ORDER — SODIUM CHLORIDE 0.9 % IV SOLN
INTRAVENOUS | Status: DC
  Administered 2015-05-17 – 2015-05-22 (×5): via INTRAVENOUS

## 2015-05-17 MED ORDER — LIP MEDEX EX OINT
TOPICAL_OINTMENT | CUTANEOUS | Status: AC
  Filled 2015-05-17: qty 7

## 2015-05-17 MED ORDER — LIDOCAINE HCL (CARDIAC) 20 MG/ML IV SOLN
INTRAVENOUS | Status: DC | PRN
  Administered 2015-05-17: 50 mg via INTRAVENOUS

## 2015-05-17 MED ORDER — DOCUSATE SODIUM 100 MG PO CAPS
100.0000 mg | ORAL_CAPSULE | Freq: Two times a day (BID) | ORAL | Status: DC
  Filled 2015-05-17 (×3): qty 1

## 2015-05-17 MED ORDER — DEXAMETHASONE SODIUM PHOSPHATE 10 MG/ML IJ SOLN
INTRAMUSCULAR | Status: DC | PRN
  Administered 2015-05-17: 10 mg via INTRAVENOUS

## 2015-05-17 MED ORDER — ACETAMINOPHEN 650 MG RE SUPP: 650.0000 mg | Freq: Four times a day (QID) | RECTAL | Status: DC | PRN

## 2015-05-17 MED ORDER — ACETAMINOPHEN 325 MG PO TABS
650.0000 mg | ORAL_TABLET | Freq: Four times a day (QID) | ORAL | Status: DC | PRN
  Administered 2015-05-19: 650 mg via ORAL
  Filled 2015-05-17: qty 2

## 2015-05-17 MED ORDER — POTASSIUM CHLORIDE 10 MEQ/100ML IV SOLN
INTRAVENOUS | Status: AC
Start: 2015-05-17 — End: 2015-05-17
  Administered 2015-05-17: 10 meq via INTRAVENOUS
  Filled 2015-05-17: qty 100

## 2015-05-17 MED ORDER — POTASSIUM CHLORIDE 10 MEQ/100ML IV SOLN
10.0000 meq | INTRAVENOUS | Status: DC
  Administered 2015-05-17: 10 meq via INTRAVENOUS
  Filled 2015-05-17: qty 100

## 2015-05-17 MED ORDER — ETOMIDATE 2 MG/ML IV SOLN
INTRAVENOUS | Status: DC | PRN
  Administered 2015-05-17: 12 mg via INTRAVENOUS

## 2015-05-17 MED ORDER — MIDAZOLAM HCL 2 MG/2ML IJ SOLN
INTRAMUSCULAR | Status: AC
  Filled 2015-05-17: qty 2

## 2015-05-17 MED ORDER — PROPOFOL 10 MG/ML IV BOLUS
INTRAVENOUS | Status: AC
  Filled 2015-05-17: qty 20

## 2015-05-17 MED ORDER — FENTANYL CITRATE (PF) 250 MCG/5ML IJ SOLN
INTRAMUSCULAR | Status: AC
  Filled 2015-05-17: qty 5

## 2015-05-17 MED ORDER — DEXAMETHASONE SODIUM PHOSPHATE 10 MG/ML IJ SOLN
INTRAMUSCULAR | Status: AC
  Filled 2015-05-17: qty 1

## 2015-05-17 MED ORDER — SODIUM CHLORIDE 0.9 % IJ SOLN
3.0000 mL | Freq: Two times a day (BID) | INTRAMUSCULAR | Status: DC
  Administered 2015-05-21: 3 mL via INTRAVENOUS

## 2015-05-17 MED ORDER — SODIUM CHLORIDE 0.9 % IR SOLN
Status: DC | PRN
  Administered 2015-05-17: 3000 mL

## 2015-05-17 MED ORDER — PIPERACILLIN-TAZOBACTAM IN DEX 2-0.25 GM/50ML IV SOLN
2.2500 g | Freq: Three times a day (TID) | INTRAVENOUS | Status: DC
  Administered 2015-05-17: 2.25 g via INTRAVENOUS
  Filled 2015-05-17 (×2): qty 50

## 2015-05-17 MED ORDER — ONDANSETRON HCL 4 MG/2ML IJ SOLN: 4.0000 mg | Freq: Four times a day (QID) | INTRAMUSCULAR | Status: DC | PRN

## 2015-05-17 MED ORDER — ONDANSETRON HCL 4 MG/2ML IJ SOLN
INTRAMUSCULAR | Status: DC | PRN
  Administered 2015-05-17: 4 mg via INTRAVENOUS

## 2015-05-17 MED ORDER — IOHEXOL 300 MG/ML  SOLN
INTRAMUSCULAR | Status: DC | PRN
  Administered 2015-05-17: 10 mL

## 2015-05-17 MED ORDER — SODIUM CHLORIDE 0.9 % IV BOLUS (SEPSIS)
250.0000 mL | Freq: Once | INTRAVENOUS | Status: AC
  Administered 2015-05-17: 250 mL via INTRAVENOUS

## 2015-05-17 MED ORDER — SENNA 8.6 MG PO TABS
1.0000 | ORAL_TABLET | Freq: Two times a day (BID) | ORAL | Status: DC
  Administered 2015-05-18: 8.6 mg via ORAL
  Filled 2015-05-17 (×4): qty 1

## 2015-05-17 MED ORDER — DEXTROSE 5 % IV SOLN
2.0000 g | INTRAVENOUS | Status: DC
  Administered 2015-05-17: 2 g via INTRAVENOUS
  Filled 2015-05-17: qty 2

## 2015-05-17 MED ORDER — 0.9 % SODIUM CHLORIDE (POUR BTL) OPTIME
TOPICAL | Status: DC | PRN
  Administered 2015-05-17: 1000 mL

## 2015-05-17 MED ORDER — SODIUM CHLORIDE 0.9 % IV BOLUS (SEPSIS)
1000.0000 mL | Freq: Once | INTRAVENOUS | Status: AC
  Administered 2015-05-17: 1000 mL via INTRAVENOUS

## 2015-05-17 MED ORDER — LACTATED RINGERS IV SOLN
INTRAVENOUS | Status: DC | PRN
  Administered 2015-05-17: 11:00:00 via INTRAVENOUS

## 2015-05-17 MED ORDER — LIDOCAINE HCL (CARDIAC) 20 MG/ML IV SOLN
INTRAVENOUS | Status: AC
  Filled 2015-05-17: qty 5

## 2015-05-17 MED ORDER — BISACODYL 10 MG RE SUPP: 10.0000 mg | Freq: Every day | RECTAL | Status: DC | PRN

## 2015-05-17 MED ORDER — FENTANYL CITRATE (PF) 100 MCG/2ML IJ SOLN
INTRAMUSCULAR | Status: AC
  Filled 2015-05-17: qty 2

## 2015-05-17 MED ORDER — MIDAZOLAM HCL 5 MG/5ML IJ SOLN
INTRAMUSCULAR | Status: DC | PRN
  Administered 2015-05-17 (×2): 0.5 mg via INTRAVENOUS

## 2015-05-17 MED ORDER — FENTANYL CITRATE (PF) 100 MCG/2ML IJ SOLN
INTRAMUSCULAR | Status: DC | PRN
  Administered 2015-05-17: 50 ug via INTRAVENOUS

## 2015-05-17 MED ORDER — PIPERACILLIN-TAZOBACTAM 3.375 G IVPB
3.3750 g | Freq: Three times a day (TID) | INTRAVENOUS | Status: DC
  Administered 2015-05-17 – 2015-05-19 (×7): 3.375 g via INTRAVENOUS
  Filled 2015-05-17 (×7): qty 50

## 2015-05-17 MED ORDER — HYDROCODONE-ACETAMINOPHEN 5-325 MG PO TABS
1.0000 | ORAL_TABLET | ORAL | Status: DC | PRN
  Administered 2015-05-20 – 2015-05-22 (×3): 2 via ORAL
  Filled 2015-05-17 (×3): qty 2

## 2015-05-17 MED ORDER — SUCCINYLCHOLINE CHLORIDE 20 MG/ML IJ SOLN
INTRAMUSCULAR | Status: DC | PRN
  Administered 2015-05-17: 100 mg via INTRAVENOUS

## 2015-05-17 MED ORDER — ONDANSETRON HCL 4 MG PO TABS: 4.0000 mg | ORAL_TABLET | Freq: Four times a day (QID) | ORAL | Status: DC | PRN

## 2015-05-17 MED ORDER — ONDANSETRON HCL 4 MG/2ML IJ SOLN
INTRAMUSCULAR | Status: AC
  Filled 2015-05-17: qty 2

## 2015-05-17 MED ORDER — ROCURONIUM BROMIDE 100 MG/10ML IV SOLN
INTRAVENOUS | Status: AC
  Filled 2015-05-17: qty 1

## 2015-05-17 SURGICAL SUPPLY — 20 items
BAG URINE DRAINAGE (UROLOGICAL SUPPLIES) ×3 IMPLANT
BAG URO CATCHER STRL LF (MISCELLANEOUS) ×3 IMPLANT
CATH FOLEY 2WAY SLVR  5CC 16FR (CATHETERS) ×2
CATH FOLEY 2WAY SLVR 5CC 16FR (CATHETERS) ×1 IMPLANT
CATH INTERMIT  6FR 70CM (CATHETERS) ×3 IMPLANT
CLOTH BEACON ORANGE TIMEOUT ST (SAFETY) ×3 IMPLANT
GLOVE BIO SURGEON STRL SZ7 (GLOVE) ×3 IMPLANT
GLOVE BIO SURGEON STRL SZ7.5 (GLOVE) ×3 IMPLANT
GLOVE BIOGEL PI IND STRL 6 (GLOVE) ×1 IMPLANT
GLOVE BIOGEL PI IND STRL 7.0 (GLOVE) ×1 IMPLANT
GLOVE BIOGEL PI INDICATOR 6 (GLOVE) ×2
GLOVE BIOGEL PI INDICATOR 7.0 (GLOVE) ×2
GOWN STRL NON-REIN LRG LVL3 (GOWN DISPOSABLE) ×3 IMPLANT
GOWN STRL REUS W/TWL LRG LVL3 (GOWN DISPOSABLE) ×3 IMPLANT
GOWN STRL REUS W/TWL XL LVL3 (GOWN DISPOSABLE) ×3 IMPLANT
GUIDEWIRE STR DUAL SENSOR (WIRE) ×3 IMPLANT
PACK CYSTO (CUSTOM PROCEDURE TRAY) ×3 IMPLANT
STENT URET 6FRX24 CONTOUR (STENTS) ×3 IMPLANT
TUBING CONNECTING 10 (TUBING) ×2 IMPLANT
TUBING CONNECTING 10' (TUBING) ×1

## 2015-05-17 NOTE — Progress Notes (Signed)
CRITICAL VALUE ALERT  Critical value received:  Gram negative rods in anerobic bottles  Date of notification:  05/17/2015  Time of notification:  [id number]  Critical value read back:Yes.    Nurse who received alert:  Leola Brazil, RN  MD notified (1st page):  Dr. Erlinda Hong  Time of first page: 0815 In person on unit  MD notified (2nd page):   Time of second page:  Responding MD:  Dr. Erlinda Hong  Time MD responded: in person on unit

## 2015-05-17 NOTE — Anesthesia Preprocedure Evaluation (Addendum)
Anesthesia Evaluation  Patient identified by MRN, date of birth, ID band Patient awake  General Assessment Comment:Past Medical History:  Past Medical History Diagnosis Date . Skin complaints    sees Dr. Allyson Sabal . History of kidney stones    Dr. Gaynelle Arabian, [facility] . Wears glasses  . Migraine  . H/O bone density study    remote past . History of mammogram    remote past . Ductal carcinoma in situ of breast 08/17/13 . Ductal carcinoma in situ of breast 08/17/13 . Anxiety  . Wears glasses  . Cancer [facility])    Past Surgical History Procedure Laterality Date . Tubal ligation   . Bunionectomy  2000   Dr. Gwyneth Revels, right . Colonoscopy  1/15   never . Orif femur fracture     age 12yr-left       Reviewed: Allergy & Precautions, NPO status , Patient's Chart, lab work & pertinent test results  Airway Mallampati: II  TM Distance: >3 FB Neck ROM: Full    Dental no notable dental hx.    Pulmonary neg pulmonary ROS,    Pulmonary exam normal breath sounds clear to auscultation       Cardiovascular negative cardio ROS Normal cardiovascular exam Rhythm:Regular Rate:Normal     Neuro/Psych  Headaches, Anxiety    GI/Hepatic negative GI ROS, Neg liver ROS,   Endo/Other  negative endocrine ROS  Renal/GU Renal InsufficiencyRenal diseaseCrt 2.04 K 4.4 this AM Na 138  negative genitourinary   Musculoskeletal negative musculoskeletal ROS (+)   Abdominal   Peds negative pediatric ROS (+)  Hematology negative hematology ROS (+)   Anesthesia Other Findings   Reproductive/Obstetrics negative OB ROS                            Anesthesia Physical Anesthesia Plan  ASA: III and emergent  Anesthesia Plan: General   Post-op Pain Management:    Induction: Intravenous  Airway Management Planned: Oral  ETT  Additional Equipment:   Intra-op Plan:   Post-operative Plan: Extubation in OR  Informed Consent: I have reviewed the patients History and Physical, chart, labs and discussed the procedure including the risks, benefits and alternatives for the proposed anesthesia with the patient or authorized representative who has indicated his/her understanding and acceptance.   Dental advisory given  Plan Discussed with: CRNA  Anesthesia Plan Comments: (Evidence of sepsis including tachycardia. INR 1.58, WBC 17.3  ;  Lactic acid 3.3, down from 6.17  She states she feels much better than yesterday. No nausea and no pain currently.)      Anesthesia Quick Evaluation

## 2015-05-17 NOTE — Progress Notes (Addendum)
ANTIBIOTIC CONSULT NOTE - INITIAL  Pharmacy Consult for Ceftazidime & Zosyn Indication: Sepsis  Allergies  Allergen Reactions  . Other     No blood products. 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    Patient Measurements: Height: 5' 2.6" (159 cm) Weight: 126 lb 5.2 oz (57.3 kg) IBW/kg (Calculated) : 51.48  Vital Signs: Temp: 98.2 F (36.8 C) (12/17 0100) Temp Source: Oral (12/17 0100) BP: 121/88 mmHg (12/17 0100) Pulse Rate: 114 (12/17 0030) Intake/Output from previous day: 12/16 0701 - 12/17 0700 In: 1050 [IV Piggyback:1050] Out: -  Intake/Output from this shift:    Labs:  Recent Labs  05/15/15 0001 05/16/15 1711  WBC 30.2* 18.4*  HGB 12.8 13.4  PLT 183 84*  CREATININE 1.24* 3.32*   Estimated Creatinine Clearance: 13.7 mL/min (by C-G formula based on Cr of 3.32). No results for input(s): VANCOTROUGH, VANCOPEAK, VANCORANDOM, GENTTROUGH, GENTPEAK, GENTRANDOM, TOBRATROUGH, TOBRAPEAK, TOBRARND, AMIKACINPEAK, AMIKACINTROU, AMIKACIN in the last 72 hours.   Microbiology: No results found for this or any previous visit (from the past 720 hour(s)).  Medical History: Past Medical History  Diagnosis Date  . Skin complaints     sees Dr. Allyson Sabal  . History of kidney stones     Dr. Gaynelle Arabian, [facility]  . Wears glasses   . Migraine   . H/O bone density study     remote past  . History of mammogram     remote past  . 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 Anxiety   . Wears glasses   . Cancer ([facility])     Medications:  Scheduled:  . cefTAZidime (FORTAZ)  IV  2 g Intravenous Q24H  . docusate sodium  100 mg Oral BID  . piperacillin-tazobactam (ZOSYN)  IV  2.25 g Intravenous Q8H  . senna  1 tablet Oral BID  . sodium chloride  1,000 mL Intravenous Once  . sodium chloride  3 mL Intravenous Q12H   Infusions:   Assessment:  64 yr female with ureteral stones and sepsis.  Scr has increased 1.2 --> 3.32 from  yesterday.  Ceftriaxone 1gm IV x 1 given in ED  Urology consulted and recommended Ceftazidime & Zosyn; pharmacy consulted to dose  CrCl ~ 13 ml/min   12/16 >>Ceftriaxone x 1 12/17 >>Ceftazidime >>  12/17 >> Zosyn >>   12/17 blood: 12/17 urine:  Goal of Therapy:  Eradication of infection Dosing based on renal function  Plan:  Follow up culture results  Ceftazidime 2gm IV q24h Zosyn 2.25gm IV q8h  Kem Parcher, Toribio Harbour, PharmD 05/17/2015,2:07 AM

## 2015-05-17 NOTE — Consult Note (Signed)
Consult Note:  Subjective: Patient to be taken to the OR for cystoscopy and attempted right ureteral stent placement as IR is uncomfortable with right nephrostomy tube placement given that she is a Jehovah's Witness and has low platelets and elevated INR. Tachycardic overnight but afebrile with stable BPs.   Objective: Vital signs in last 24 hours: Temp:  [97.4 F (36.3 C)-98.6 F (37 C)] 98.2 F (36.8 C) (12/17 0746) Pulse Rate:  [64-132] 106 (12/17 0800) Resp:  [16-30] 20 (12/17 0800) BP: (74-126)/(52-88) 112/68 mmHg (12/17 0800) SpO2:  [96 %-100 %] 99 % (12/17 0800) Weight:  [55.157 kg (121 lb 9.6 oz)-58.5 kg (128 lb 15.5 oz)] 58.5 kg (128 lb 15.5 oz) (12/17 0400)  Intake/Output from previous day: 12/16 0701 - 12/17 0700 In: 1050 [IV Piggyback:1050] Out: -  Intake/Output this shift: Total I/O In: 110 [Other:10; IV Piggyback:100] Out: 1000 [Urine:1000]  Physical Exam:  General:alert, cooperative and appears stated age, no acute distress GI: soft, right upper quadrant mildly tender to palpation, non-distended.  Back: +Right CVA tenderness Extremities: extremities normal, atraumatic, no cyanosis or edema  Lab Results:  Recent Labs  05/15/15 0001 05/16/15 1711 05/17/15 0150  HGB 12.8 13.4 11.0*  HCT 38.3 39.0 32.2*   BMET  Recent Labs  05/16/15 1711 05/17/15 0150  NA 127* 138  K 3.0* 4.4  CL 90* 111  CO2 18* 15*  GLUCOSE 118* 103*  BUN 38* 27*  CREATININE 3.32* 2.04*  CALCIUM 8.1* 7.1*    Recent Labs  05/17/15 0100 05/17/15 0150  INR 1.53* 1.58*   No results for input(s): LABURIN in the last 72 hours. Results for orders placed or performed during the hospital encounter of 05/16/15  MRSA PCR Screening     Status: None   Collection Time: 05/17/15  1:35 AM  Result Value Ref Range Status   MRSA by PCR NEGATIVE NEGATIVE Final    Comment:        The GeneXpert MRSA Assay (FDA approved for NASAL specimens only), is one component of a comprehensive  MRSA colonization surveillance program. It is not intended to diagnose MRSA infection nor to guide or monitor treatment for MRSA infections.     Studies/Results: Dg Chest Portable 1 View  05/16/2015  CLINICAL DATA:  Pt was seen yesterday and diagnosed with bladder infection. Having right lower flank pain with no associated dysuria. Emesis x1 today.Pt is tachypneic and WBC reported at 30.2. Also c/o RUQ chest pain, diarrhea, and dizziness. Nonsmoker. EXAM: PORTABLE CHEST 1 VIEW COMPARISON:  07/26/2014 FINDINGS: Prior median sternotomy. Mild right hemidiaphragm elevation. Midline trachea. Borderline cardiomegaly. No pleural effusion or pneumothorax. Small calcified right lung base nodules are likely related to old granulomatous disease. IMPRESSION: No acute cardiopulmonary disease. Electronically Signed   By: [name] M.D.   On: 05/16/2015 18:01   Ct Renal Stone Study  05/16/2015  CLINICAL DATA:  Patient was seen yesterday and diagnosed with bladder infection. Now having right lower flank pain. Vomiting today. White cell count of 30. Right upper chest pain. Diarrhea. Dizziness. EXAM: CT ABDOMEN AND PELVIS WITHOUT CONTRAST TECHNIQUE: Multidetector CT imaging of the abdomen and pelvis was performed following the standard protocol without IV contrast. COMPARISON:  None. FINDINGS: Atelectasis in the lung bases. There is an ovoid stone in the mid right ureter at the level of the sacrum measuring 6 x 12 mm. There is another smaller stone in the mid right ureter measuring 5 mm diameter. There is prominent hydronephrosis and hydroureter above the  level of the stones. Stranding around the kidney and ureters is likely due to obstruction. Infection not excluded. No left renal or ureteral stones. No bladder stones or bladder wall thickening. No hydronephrosis or hydroureter on the left. The unenhanced appearance of the liver, spleen, adrenal glands, pancreas, abdominal aorta, inferior vena cava, and  retroperitoneal lymph nodes is unremarkable. Small amount of fluid along the right pericolic gutter likely resulting from the urinary tract process. No free fluid or free air in the abdomen. Stomach, small bowel, and colon are not abnormally distended. Pelvis: Uterus and ovaries are not enlarged. No free or loculated pelvic fluid collections. No pelvic mass or lymphadenopathy. Bladder wall is not thickened. No destructive bone lesions. Vertebral hemangiomas at T12. IMPRESSION: 6 x 12 mm stone and 5 mm stone in the mid right ureter at the level of the sacrum with severe proximal obstructive change. Infiltration around the right kidney and ureter may be due to obstruction or infection. Electronically Signed   By: [name] M.D.   On: 05/16/2015 22:56    Assessment/Plan: 64 yo F presenting with a very large right 15 x 7 mm mid-ureteral stone and a second 5 mm stone just distal to this and severe hydroureteronephrosis in the setting of sepsis of urinary origin. Persistent mild tachycardia despite aggressive fluid resuscitation and improved hypotension and subjective fevers at home.   Plan:  - Take to OR urgently for cystoscopy and attempted right ureteral stent placement - Recommend broad spectrum IV antibiotics per primary team - Follow up blood and urine cultures - Recommend foley catheter placement for complete decompression of urinary system given urosepsis - particularly following stent placement as this is a refluxing system - NPO now - Continued aggressive hydration and resucitation per primary team - In the future she will need outpatient evaluation at [facility] with Dr. Pilar Jarvis for discussion of definitive treatment of the right ureteral stones after she has been fully treated for her urosepsis.    LOS: 1 day   Acie Fredrickson 05/17/2015, 8:43 AM

## 2015-05-17 NOTE — ED Provider Notes (Signed)
CSN: [id number]     Arrival date & time 05/16/15  1637 History   First MD Initiated Contact with Patient 05/16/15 1656     Chief Complaint  Patient presents with  . Tachycardia  . Flank Pain    right  . Cystitis     (Consider location/radiation/quality/duration/timing/severity/associated sxs/prior Treatment) HPI Comments: 3 days dysuria, frequency, right lower flank pain beginning today Nausea vomiting beginning today Diarrhea Diagnosis bladder infection yesterday and took one dose of Septra hwoever had n/v and symptoms not improved Severe flank pain    Patient is a 64 y.o. female presenting with flank pain.  Flank Pain Associated symptoms include abdominal pain (suprapubic). Pertinent negatives include no chest pain, no headaches and no shortness of breath.    Past Medical History  Diagnosis Date  . Skin complaints     sees Dr. Allyson Sabal  . History of kidney stones     Dr. Gaynelle Arabian, [facility]  . Wears glasses   . Migraine   . H/O bone density study     remote past  . History of mammogram     remote past  . 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 Anxiety   . Wears glasses   . Cancer [facility])    Past Surgical History  Procedure Laterality Date  . Tubal ligation    . Bunionectomy  2000    Dr. Gwyneth Revels, right  . Colonoscopy  1/15    never  . Orif femur fracture      age 71yrleft   Family History  Problem Relation Age of Onset  . Stroke Mother   . Stroke Father   . Cancer Neg Hx   . Diabetes Neg Hx   . Heart disease Neg Hx   . Hypertension Neg Hx    Social History  Substance Use Topics  . Smoking status: Never Smoker   . Smokeless tobacco: None  . Alcohol Use: No   OB History    No data available     Review of Systems  Constitutional: Positive for chills and appetite change. Negative for fever.  HENT: Negative for sore throat.   Eyes: Negative for visual disturbance.  Respiratory: Positive  for cough (xmos). Negative for shortness of breath.   Cardiovascular: Negative for chest pain.  Gastrointestinal: Positive for nausea, vomiting, abdominal pain (suprapubic) and diarrhea.  Genitourinary: Positive for dysuria, frequency and flank pain.  Musculoskeletal: Negative for back pain and neck pain.  Skin: Negative for rash.  Neurological: Positive for light-headedness. Negative for syncope and headaches.      Allergies  Other  Home Medications   Prior to Admission medications   Medication Sig Start Date End Date Taking? Authorizing Provider  Diphenhydramine-Acetaminophen (PERCOGESIC PO) Take 1 tablet by mouth daily. Reported on 05/16/2015   Yes Historical Provider, MD  ondansetron (ZOFRAN) 4 MG tablet Take 1 tablet (4 mg total) by mouth every 8 (eight) hours as needed for nausea or vomiting. 05/15/15  Yes VGirtha Rm NP  sulfamethoxazole-trimethoprim (BACTRIM DS,SEPTRA DS) 800-160 MG tablet Take 1 tablet by mouth 2 (two) times daily. 05/15/15  Yes Vickie L Henson, NP   BP 121/88 mmHg  Pulse 114  Temp(Src) 98.2 F (36.8 C) (Oral)  Resp 19  Ht 5' 2.6" (1.59 m)  Wt 126 lb 5.2 oz (57.3 kg)  BMI 22.67 kg/m2  SpO2 100% Physical Exam  Constitutional: She is oriented to person, place, and time.  She appears well-developed and well-nourished. She appears ill. No distress.  HENT:  Head: Normocephalic and atraumatic.  Eyes: Conjunctivae and EOM are normal.  Neck: Normal range of motion.  Cardiovascular: Regular rhythm, normal heart sounds and intact distal pulses.  Tachycardia present.  Exam reveals no gallop and no friction rub.   No murmur heard. Pulmonary/Chest: Effort normal and breath sounds normal. No respiratory distress. She has no wheezes. She has no rales.  Abdominal: Soft. She exhibits no distension. There is tenderness (suprapubic). There is CVA tenderness (right). There is no guarding.  Musculoskeletal: She exhibits no edema or tenderness.  Neurological: She is  alert and oriented to person, place, and time.  Skin: Skin is warm and dry. No rash noted. She is not diaphoretic. No erythema.  Nursing note and vitals reviewed.   ED Course  Procedures (including critical care time) Labs Review Labs Reviewed  COMPREHENSIVE METABOLIC PANEL - Abnormal; Notable for the following:    Sodium 127 (*)    Potassium 3.0 (*)    Chloride 90 (*)    CO2 18 (*)    Glucose, Bld 118 (*)    BUN 38 (*)    Creatinine, Ser 3.32 (*)    Calcium 8.1 (*)    AST 67 (*)    Alkaline Phosphatase 240 (*)    Total Bilirubin 1.3 (*)    GFR calc non Af Amer 14 (*)    GFR calc Af Amer 16 (*)    Anion gap 19 (*)    All other components within normal limits  CBC WITH DIFFERENTIAL/PLATELET - Abnormal; Notable for the following:    WBC 18.4 (*)    Platelets 84 (*)    Neutro Abs 17.8 (*)    Lymphs Abs 0.4 (*)    All other components within normal limits  URINALYSIS, ROUTINE W REFLEX MICROSCOPIC (NOT AT [facility]) - Abnormal; Notable for the following:    Color, Urine AMBER (*)    APPearance TURBID (*)    Hgb urine dipstick LARGE (*)    Bilirubin Urine MODERATE (*)    Protein, ur 100 (*)    Leukocytes, UA LARGE (*)    All other components within normal limits  URINE MICROSCOPIC-ADD ON - Abnormal; Notable for the following:    Squamous Epithelial / LPF 0-5 (*)    Bacteria, UA MANY (*)    All other components within normal limits  PROTIME-INR - Abnormal; Notable for the following:    Prothrombin Time 18.5 (*)    INR 1.53 (*)    All other components within normal limits  CBC WITH DIFFERENTIAL/PLATELET - Abnormal; Notable for the following:    WBC 17.3 (*)    RBC 3.52 (*)    Hemoglobin 11.0 (*)    HCT 32.2 (*)    Platelets 66 (*)    All other components within normal limits  COMPREHENSIVE METABOLIC PANEL - Abnormal; Notable for the following:    CO2 15 (*)    Glucose, Bld 103 (*)    BUN 27 (*)    Creatinine, Ser 2.04 (*)    Calcium 7.1 (*)    Total Protein 5.8 (*)     Albumin 2.8 (*)    AST 92 (*)    ALT 71 (*)    GFR calc non Af Amer 24 (*)    GFR calc Af Amer 28 (*)    All other components within normal limits  LACTIC ACID, PLASMA - Abnormal; Notable for the following:  Lactic Acid, Venous 4.2 (*)    All other components within normal limits  PROTIME-INR - Abnormal; Notable for the following:    Prothrombin Time 18.9 (*)    INR 1.58 (*)    All other components within normal limits  APTT - Abnormal; Notable for the following:    aPTT 38 (*)    All other components within normal limits  MAGNESIUM - Abnormal; Notable for the following:    Magnesium 1.5 (*)    All other components within normal limits  I-STAT CG4 LACTIC ACID, ED - Abnormal; Notable for the following:    Lactic Acid, Venous 9.68 (*)    All other components within normal limits  I-STAT CG4 LACTIC ACID, ED - Abnormal; Notable for the following:    Lactic Acid, Venous 6.17 (*)    All other components within normal limits  CULTURE, BLOOD (ROUTINE X 2)  CULTURE, BLOOD (ROUTINE X 2)  URINE CULTURE  MRSA PCR SCREENING  APTT  PHOSPHORUS  LACTIC ACID, PLASMA  PROCALCITONIN  TSH  CREATININE, URINE, RANDOM  SODIUM, URINE, RANDOM  OSMOLALITY, URINE    Imaging Review Dg Chest Portable 1 View  05/16/2015  CLINICAL DATA:  Pt was seen yesterday and diagnosed with bladder infection. Having right lower flank pain with no associated dysuria. Emesis x1 today.Pt is tachypneic and WBC reported at 30.2. Also c/o RUQ chest pain, diarrhea, and dizziness. Nonsmoker. EXAM: PORTABLE CHEST 1 VIEW COMPARISON:  07/26/2014 FINDINGS: Prior median sternotomy. Mild right hemidiaphragm elevation. Midline trachea. Borderline cardiomegaly. No pleural effusion or pneumothorax. Small calcified right lung base nodules are likely related to old granulomatous disease. IMPRESSION: No acute cardiopulmonary disease. Electronically Signed   By: [name] M.D.   On: 05/16/2015 18:01   Ct Renal Stone  Study  05/16/2015  CLINICAL DATA:  Patient was seen yesterday and diagnosed with bladder infection. Now having right lower flank pain. Vomiting today. White cell count of 30. Right upper chest pain. Diarrhea. Dizziness. EXAM: CT ABDOMEN AND PELVIS WITHOUT CONTRAST TECHNIQUE: Multidetector CT imaging of the abdomen and pelvis was performed following the standard protocol without IV contrast. COMPARISON:  None. FINDINGS: Atelectasis in the lung bases. There is an ovoid stone in the mid right ureter at the level of the sacrum measuring 6 x 12 mm. There is another smaller stone in the mid right ureter measuring 5 mm diameter. There is prominent hydronephrosis and hydroureter above the level of the stones. Stranding around the kidney and ureters is likely due to obstruction. Infection not excluded. No left renal or ureteral stones. No bladder stones or bladder wall thickening. No hydronephrosis or hydroureter on the left. The unenhanced appearance of the liver, spleen, adrenal glands, pancreas, abdominal aorta, inferior vena cava, and retroperitoneal lymph nodes is unremarkable. Small amount of fluid along the right pericolic gutter likely resulting from the urinary tract process. No free fluid or free air in the abdomen. Stomach, small bowel, and colon are not abnormally distended. Pelvis: Uterus and ovaries are not enlarged. No free or loculated pelvic fluid collections. No pelvic mass or lymphadenopathy. Bladder wall is not thickened. No destructive bone lesions. Vertebral hemangiomas at T12. IMPRESSION: 6 x 12 mm stone and 5 mm stone in the mid right ureter at the level of the sacrum with severe proximal obstructive change. Infiltration around the right kidney and ureter may be due to obstruction or infection. Electronically Signed   By: [name] M.D.   On: 05/16/2015 22:56   I  have personally reviewed and evaluated these images and lab results as part of my medical decision-making.   EKG  Interpretation None       CRITICAL CARE: SEPSIS Performed by: Alvino Chapel   Total critical care time: 30 minutes  Critical care time was exclusive of separately billable procedures and treating other patients.  Critical care was necessary to treat or prevent imminent or life-threatening deterioration.  Critical care was time spent personally by me on the following activities: development of treatment plan with patient and/or surrogate as well as nursing, discussions with consultants, evaluation of patient's response to treatment, examination of patient, obtaining history from patient or surrogate, ordering and performing treatments and interventions, ordering and review of laboratory studies, ordering and review of radiographic studies, pulse oximetry and re-evaluation of patient's condition.  MDM   Final diagnoses:  Sepsis, due to unspecified organism ([place])  Lactic acidosis  Acute kidney injury ([place])   64 year old female with a history of ductal carcinoma in situ of the right breast, under holistic therapy, presents with concern for right flank pain, urinary symptoms, nausea and vomiting. Patient was diagnosed with a urinary tract infection yesterday, and started on Bactrim however patient developed worsening nausea and vomiting and had lab work done today concerning for a leukocytosis of 30. On arrival to the emergency department patient is tachycardic to the 130s, hypotensive to 84/52 and with a lactate of 9.68. Code sepsis was initiated, and patient was given Rocephin for urinary source of sepsis. Chest x-ray showed no signs of pneumonia. Did not order CT initially given pain did not appear to be colicky and appeared to be more consistent with pyelonephritis than nephrolithiasis. Pt with mild elevation in alk phos/bili/ast, however no significant RUQ pain and doubt cholangitis. Patient with normal mentation, however significantly elevated lactate, and a acute kidney injury  with a creatinine of 3.3.  Given 2 L of normal saline with maps of the normal limits, however systolics 80 given and 2 additional liters of normal saline were ordered with improvement of blood pressures to 161W systolic with MAPs in the 70s.    Lactic acid decreased to 6.17.  Consulted CCM given elevation in lactic acid and Dr. Roel Cluck for admission to [facility].  Pt with BP stable at this time, normal mentation, appropriate for stepdown unit.    Gareth Morgan, MD 05/17/15 [phone]

## 2015-05-17 NOTE — ED Notes (Signed)
PER UROLOGY, DO NO TAKE THE PT TO THE FLOOR FOR THE POSSIBILITY OF THE PT GOING TO THE OR FOR AN EMERGENCY PROCEDURE. ED CHARGE NURSE, ICU CHARGE NURSE, AND PT MADE AWARE.

## 2015-05-17 NOTE — Anesthesia Procedure Notes (Signed)
Procedure Name: Intubation Date/Time: 05/17/2015 11:49 AM Performed by: Ofilia Neas Pre-anesthesia Checklist: Patient identified, Emergency Drugs available, Suction available, Patient being monitored and Timeout performed Patient Re-evaluated:Patient Re-evaluated prior to inductionOxygen Delivery Method: Circle system utilized Preoxygenation: Pre-oxygenation with 100% oxygen Intubation Type: IV induction, Cricoid Pressure applied and Rapid sequence Laryngoscope Size: Mac and 3 Grade View: Grade I Tube type: Oral Tube size: 7.5 mm Number of attempts: 1 Airway Equipment and Method: Stylet Secured at: 21 cm Tube secured with: Tape Dental Injury: Teeth and Oropharynx as per pre-operative assessment

## 2015-05-17 NOTE — Op Note (Signed)
Operative Note:  Preoperative Diagnosis: Right ureteral stones  Postoperative Diagnosis:  Same  Procedure(s) Performed:   1. Cystourethroscopy 2. Right retrograde pyelogram 3. Right ureteral stent placement, 81F x 24 cm JJ ureteral stent without string  Teaching Surgeon:  Baruch Gouty, MD  Resident Surgeon:  E. Harvel Ricks, MD  Assistant(s):  None  Anesthesia:  General via endotracheal tube  Fluids:  See anesthesia record  Estimated blood loss:  <5 mL  Specimens:  None  Cultures:  Right renal pelvis urine sent for culture  Drains:  RIGHT 81F x 24 cm JJ ureteral stent without string  Complications:  None  Indications: See prior  Findings:   1. Cystourethroscopy with normal bladder and urethra - no gross evidence of infection 2. Successful advancement of sensor wire into right renal pelvis. No resistance with passage of wire beyond stone. Significant medial deviation of mid-ureter and full 360 degree loop in the right proximal ureter. Successfully able to advance open ended catheter over the wire and with manipulation could straighten out ureter. Urine sample sent from right proximal ureter - no purulence or severe hydronephrotic drip. 3. Successful placement of right 81F x 24 cm JJ ureteral stent without string 4. Placement of 181F foley catheter   Description:  The patient was correctly identified in the preop holding area where written informed consent as well potential risk and complication reviewed. She agreed. They were brought back to the operative suite where a preinduction timeout was performed. Once correct information was verified, general anesthesia was induced via endotracheal tube. They were then gently placed into dorsal lithotomy position with SCDs in place for VTE prophylaxis. They were prepped and draped in the usual sterile fashion and given appropriate preoperative antibiotics. A second timeout was then performed.   We inserted a 81F rigid cystoscope per  urethra with copious lubrication and normal saline irrigation running. This demonstrated findings as described above.  We cannulated the right ureteral orifice with the combination of a open ended catheter and a sensor wire and advanced the wire into the collecting system under fluoroscopic guidance. We met minimal resistance as we passed the wire along side the expected location of the ureteral stone. Initially we met some resistance in the proximal ureter and were able to pass the open ended over the wire with moderate resistance and then advanced the wire into the renal pelvis. This demonstrated a 360 degree loop in the proximal ureter. We removed the wire and obtained a urine sample sent from right proximal ureter with return of no purulence or severe hydronephrotic drip. We then performed a gentle retrograde pyelogram which demonstrated severe hydroureteronephrosis and a full 360 degree loop in the ureter. We then replaced the wire into the renal pelvis. We were able to successfully advance open ended catheter over the wire into the proximal ureter and with manipulation could straighten out ureter. We removed the open ended catheter and obtained a 81F x 24 cm JJ ureteral stent without string. This was advanced over the wire into the renal pelvis with mild to moderate resistance under fluoroscopic and direct visual guidance. There was an excellent stent curl in the right renal pelvis and curl in the bladder following wire removal. This also demonstrate efflux of urine with a small amount of debris.  All instrumentation was removed and the bladder was left full. A 181F foley was placed in the bladder and 10 mL sterile water was placed in the balloon. She was awoken and taken to the PACU  in stable condition.  Post Op Plan:   1. Transer patient back to [facility]- ICU when meets PACU criteria.  Attestation:  Dr. Pilar Jarvis was present and scrubbed for key portions of the and and scrubbed for the entirety of the  procedure.

## 2015-05-17 NOTE — Progress Notes (Signed)
CRITICAL VALUE ALERT    Critical value received:PCT 62.97  Date of notification:05/17/2015     Time of notification:  6:51 AM   Critical value read back:Yes.    Nurse who received alert:  Jeanie Sewer   MD notified (1st page):  Janit Pagan  Time of first page:  6:52 AM   MD notified (2nd page):  Time of second page:  Responding MD:    Time MD responded:

## 2015-05-17 NOTE — Progress Notes (Signed)
Pharmacy Antibiotic Follow-up Note  Alison Zimmerman is a 64 y.o. year-old female admitted on 05/16/2015.  The patient is currently on day 1 of Zosyn for severe sepsis 2/2 pyelonephritis . CT scan showed nephrolithiasis with hydronephrosis and obstruction. Per urology, plan for cystoscopy with R stent placement today, 05/17/15.  Assessment/Plan:  Per Dr. Frederico Hamman with Urology, Ceftazidime discontinued.  Change Zosyn to 3.375g IV q8h (infuse over 4 hours) given improvement in renal function (CrCl > 20 ml/min CG).  Monitor renal function, cultures, clinical course.   Temp (24hrs), Avg:98 F (36.7 C), Min:97.4 F (36.3 C), Max:98.6 F (37 C)   Recent Labs Lab 05/15/15 0001 05/16/15 1711 05/17/15 0150  WBC 30.2* 18.4* 17.3*    Recent Labs Lab 05/15/15 0001 05/16/15 1711 05/17/15 0150  CREATININE 1.24* 3.32* 2.04*   Estimated Creatinine Clearance: 22.4 mL/min (by C-G formula based on Cr of 2.04).    Allergies  Allergen Reactions  . Other     No blood products. 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    Antimicrobials this admission: 12/17 >> Ceftazidime >> 12/17 12/17 >> Zosyn >>  Levels/dose changes this admission: 12/17: Change Zosyn from 2.25g q8h to Zosyn 3.375g IV extended infusion q8h for improvement in renal function  Microbiology results: 12/16 BCx x 2: sent 12/16 UCx: sent  12/17 MRSA PCR: negative  Thank you for allowing pharmacy to be a part of this patient's care.   Lindell Spar, PharmD, BCPS Pager: [phone] 05/17/2015 10:36 AM

## 2015-05-17 NOTE — Anesthesia Postprocedure Evaluation (Signed)
Anesthesia Post Note  Patient: Alison Zimmerman  Procedure(s) Performed: Procedure(s) (LRB): CYSTOSCOPY, RETROGRADE PYELOGRAM WITH STENT PLACEMENT (Right)  Patient location during evaluation: PACU Anesthesia Type: General Level of consciousness: awake and alert Pain management: pain level controlled Vital Signs Assessment: post-procedure vital signs reviewed and stable Respiratory status: spontaneous breathing, nonlabored ventilation, respiratory function stable and patient connected to nasal cannula oxygen Cardiovascular status: blood pressure returned to baseline and stable Postop Assessment: no signs of nausea or vomiting Anesthetic complications: no    Last Vitals:  Filed Vitals:   05/17/15 1430 05/17/15 1500  BP: 122/61 130/62  Pulse: 114 125  Temp:    Resp: 20 24    Last Pain:  Filed Vitals:   05/17/15 1501  PainSc: 0-No pain                 Carlena Ruybal J

## 2015-05-17 NOTE — Progress Notes (Signed)
After multiple separate conversations with Dr. Erlinda Hong, Dr. Frederico Hamman, and Dr. Pilar Jarvis pt has agreed verbally to Dr. Pilar Jarvis and this RN to receive platelets, plasma, or albumin, if necessary, but no RBCs. This RN again confirmed with patient that she was okay with receiving platelets, plasma, or albumin.  Consent was changed and signed to reflect this change in preference.

## 2015-05-17 NOTE — Progress Notes (Addendum)
PROGRESS NOTE  Alison Zimmerman [id number] DOB: 03/11/1951 DOA: 05/16/2015 PCP: [name], MD  HPI/Recap of past 24 hours:  Feeling better, less flank pain, able to stand up without feeling dizzy, 1.4liter of urine since admission, currently npo, awaiting urological procedure.  Assessment/Plan: Active Problems:   Sepsis ([place])   Ductal carcinoma in situ (DCIS) of right breast   Acute renal failure (ARF) (HCC)   Severe sepsis with acute organ dysfunction (HCC)   Hyponatremia   Hypokalemia  Sever sepsis  Presented with hypotension bp 84/52, sinus tachycardia, hr 127, tachypnea rr 24, significant leukocytosis wbc of 30.2 with bandemia, lactic acidosis, lactic acid 9.68 , uti/hydronephrosis from obstructing right renal stone. -urine culture and blood culture pending -ivf/abx, critical care following  UTI/right sided hydronephrosis/obstructing right renal stone: urology and ir consulted, stent placement today, if not successful, may need nephrostomy tube placement by IR.  Elevated INR and thrombocytopenia: likely from infection. No active bleed. Treat underline infection.  ARF: from uti/hydro/sepsis. Improving, treat underline disease, renal dosing meds.  lft elevation: likely from sepsis. Will get ruq [place] and hepatitis panel.  Hypokalemia/hypomagnesemia: replace, keep k>4, mag>2  Hyponatremia: presented on admission,  resolved with ivf.  H/o ductal carcinoma in situ diagnosed in 07/2013, refused conventional medical treatment.  Jehovah witness: refused prbc transfusion but ok with plt /ffp/albumin.  Code Status: full  Family Communication: patient   Disposition Plan: remain in stepdown   Consultants:  Urology  Critical care  Interventional radiology  Procedures:  Cystoscope/right ureteral stenting  Antibiotics:  Rocephin x1 on 12/16  Ceftazidime on 12/17  Zosyn from 12/17   Objective: BP 112/68 mmHg  Pulse 106  Temp(Src) 98.2 F (36.8  C) (Oral)  Resp 20  Ht 5' 2.6" (1.59 m)  Wt 128 lb 15.5 oz (58.5 kg)  BMI 23.14 kg/m2  SpO2 99%  Intake/Output Summary (Last 24 hours) at 05/17/15 0813 Last data filed at 05/17/15 0800  Gross per 24 hour  Intake   1160 ml  Output   1000 ml  Net    160 ml   Filed Weights   05/17/15 0100 05/17/15 0400  Weight: 126 lb 5.2 oz (57.3 kg) 128 lb 15.5 oz (58.5 kg)    Exam:   General:  NAD  Cardiovascular: sinus tachycardia  Respiratory: CTABL  Abdomen: Soft/ND/NT, positive BS, mild right cva tenderness  Musculoskeletal: No Edema  Neuro: aaox3  Data Reviewed: Basic Metabolic Panel:  Recent Labs Lab 05/15/15 0001 05/16/15 1711 05/17/15 0150  NA 136 127* 138  K 3.9 3.0* 4.4  CL 100 90* 111  CO2 23 18* 15*  GLUCOSE 132* 118* 103*  BUN 21 38* 27*  CREATININE 1.24* 3.32* 2.04*  CALCIUM 9.0 8.1* 7.1*  MG  --   --  1.5*  PHOS  --   --  2.7   Liver Function Tests:  Recent Labs Lab 05/15/15 0001 05/16/15 1711 05/17/15 0150  AST 26 67* 92*  ALT 20 40 71*  ALKPHOS 71 240* 99  BILITOT 0.9 1.3* 1.1  PROT 6.9 7.4 5.8*  ALBUMIN 4.1 3.9 2.8*   No results for input(s): LIPASE, AMYLASE in the last 168 hours. No results for input(s): AMMONIA in the last 168 hours. CBC:  Recent Labs Lab 05/15/15 0001 05/16/15 1711 05/17/15 0150  WBC 30.2* 18.4* 17.3*  NEUTROABS 28.7* 17.8* 16.8*  HGB 12.8 13.4 11.0*  HCT 38.3 39.0 32.2*  MCV 90.8 91.1 91.5  PLT 183 84* 66*   Cardiac  Enzymes:   No results for input(s): CKTOTAL, CKMB, CKMBINDEX, TROPONINI in the last 168 hours. BNP (last 3 results) No results for input(s): BNP in the last 8760 hours.  ProBNP (last 3 results) No results for input(s): PROBNP in the last 8760 hours.  CBG: No results for input(s): GLUCAP in the last 168 hours.  Recent Results (from the past 240 hour(s))  MRSA PCR Screening     Status: None   Collection Time: 05/17/15  1:35 AM  Result Value Ref Range Status   MRSA by PCR NEGATIVE  NEGATIVE Final    Comment:        The GeneXpert MRSA Assay (FDA approved for NASAL specimens only), is one component of a comprehensive MRSA colonization surveillance program. It is not intended to diagnose MRSA infection nor to guide or monitor treatment for MRSA infections.      Studies: Dg Chest Portable 1 View  05/16/2015  CLINICAL DATA:  Pt was seen yesterday and diagnosed with bladder infection. Having right lower flank pain with no associated dysuria. Emesis x1 today.Pt is tachypneic and WBC reported at 30.2. Also c/o RUQ chest pain, diarrhea, and dizziness. Nonsmoker. EXAM: PORTABLE CHEST 1 VIEW COMPARISON:  07/26/2014 FINDINGS: Prior median sternotomy. Mild right hemidiaphragm elevation. Midline trachea. Borderline cardiomegaly. No pleural effusion or pneumothorax. Small calcified right lung base nodules are likely related to old granulomatous disease. IMPRESSION: No acute cardiopulmonary disease. Electronically Signed   By: [name] M.D.   On: 05/16/2015 18:01   Ct Renal Stone Study  05/16/2015  CLINICAL DATA:  Patient was seen yesterday and diagnosed with bladder infection. Now having right lower flank pain. Vomiting today. White cell count of 30. Right upper chest pain. Diarrhea. Dizziness. EXAM: CT ABDOMEN AND PELVIS WITHOUT CONTRAST TECHNIQUE: Multidetector CT imaging of the abdomen and pelvis was performed following the standard protocol without IV contrast. COMPARISON:  None. FINDINGS: Atelectasis in the lung bases. There is an ovoid stone in the mid right ureter at the level of the sacrum measuring 6 x 12 mm. There is another smaller stone in the mid right ureter measuring 5 mm diameter. There is prominent hydronephrosis and hydroureter above the level of the stones. Stranding around the kidney and ureters is likely due to obstruction. Infection not excluded. No left renal or ureteral stones. No bladder stones or bladder wall thickening. No hydronephrosis or hydroureter on  the left. The unenhanced appearance of the liver, spleen, adrenal glands, pancreas, abdominal aorta, inferior vena cava, and retroperitoneal lymph nodes is unremarkable. Small amount of fluid along the right pericolic gutter likely resulting from the urinary tract process. No free fluid or free air in the abdomen. Stomach, small bowel, and colon are not abnormally distended. Pelvis: Uterus and ovaries are not enlarged. No free or loculated pelvic fluid collections. No pelvic mass or lymphadenopathy. Bladder wall is not thickened. No destructive bone lesions. Vertebral hemangiomas at T12. IMPRESSION: 6 x 12 mm stone and 5 mm stone in the mid right ureter at the level of the sacrum with severe proximal obstructive change. Infiltration around the right kidney and ureter may be due to obstruction or infection. Electronically Signed   By: [name] M.D.   On: 05/16/2015 22:56    Scheduled Meds: . cefTAZidime (FORTAZ)  IV  2 g Intravenous Q24H  . docusate sodium  100 mg Oral BID  . lip balm      . piperacillin-tazobactam (ZOSYN)  IV  2.25 g Intravenous Q8H  . senna  1 tablet Oral BID  . sodium chloride  250 mL Intravenous Once  . sodium chloride  3 mL Intravenous Q12H    Continuous Infusions:    Time spent: 75mins  Leonetta Mcgivern MD, PhD  [facility] Pager [phone]. If 7PM-7AM, please contact night-coverage at [email or web address], password [facility] 05/17/2015, 8:13 AM  LOS: 1 day

## 2015-05-17 NOTE — Transfer of Care (Signed)
Immediate Anesthesia Transfer of Care Note  Patient: Alison Zimmerman  Procedure(s) Performed: Procedure(s): CYSTOSCOPY, RETROGRADE PYELOGRAM WITH STENT PLACEMENT (Right)  Patient Location: PACU  Anesthesia Type:General  Level of Consciousness: awake, oriented, patient cooperative, lethargic and responds to stimulation  Airway & Oxygen Therapy: Patient Spontanous Breathing and Patient connected to face mask oxygen  Post-op Assessment: Report given to RN, Post -op Vital signs reviewed and stable and Patient moving all extremities  Post vital signs: Reviewed and stable  Last Vitals:  Filed Vitals:   05/17/15 0920 05/17/15 1000  BP: 123/84 126/68  Pulse: 131 121  Temp:    Resp: 19 19    Complications: No apparent anesthesia complications

## 2015-05-17 NOTE — Progress Notes (Signed)
Name: Alison Zimmerman MRN: [id number] DOB: Jul 13, 1950    ADMISSION DATE:  05/16/2015 CONSULTATION DATE:  05/16/15  REFERRING MD :  Roel Cluck  CHIEF COMPLAINT:  Flank pain  Reason for consultation: sepsis   HISTORY OF PRESENT ILLNESS:  Pt is a 64yo female presents to ED with flank pain and urinary frequence that have been going on for almost a week. They havebeen associated with chills, nausea, vomiting, subjective fever, and just feeling ill. She presented to her PCP yesterday and received bactrim for a UTI. She was seen in the ED and was hypotensive. She received ABx and 4L IVF and is currently feeling better with improved BP. Patient states that she began having diarrhea after she started taking Bactrim yesterday.    SUBJECTIVE:  Appears stable VITAL SIGNS: Temp:  [97.4 F (36.3 C)-98.6 F (37 C)] 98.2 F (36.8 C) (12/17 0746) Pulse Rate:  [64-132] 121 (12/17 1000) Resp:  [16-30] 19 (12/17 1000) BP: (74-126)/(52-88) 126/68 mmHg (12/17 1000) SpO2:  [96 %-100 %] 97 % (12/17 1000) Weight:  [121 lb 9.6 oz (55.157 kg)-128 lb 15.5 oz (58.5 kg)] 128 lb 15.5 oz (58.5 kg) (12/17 0400)  PHYSICAL EXAMINATION: General:  64yo african [place] female who appears her stated and is in no acute distress. She is AAOx4.  Neuro:  Cranial nerves II-XII grossly intact. PERRL. EOMI. Sensation and strength intact. no focal deficits. Speech clear HEENT: Head, normocephalic, atraumatic. Ears symmetric, permeable. Eyes: no conjunctival icterus, no erythema. Nose: permeable, midline septum,  Clear oropharynx Cardiovascular: S1S2 tachycardic no murmurs, rubs, or gallops auscultated. No thrills palpated.  Lungs:  Chest symmetrical with respirations, clear to auscultation bilaterally with no wheezing, crackles, equal expansion.  Abdomen:  Soft, nontender, no guarding, nondistended. Present bowel sounds. Musculoskeletal:  No muscle atrophy noted nor weakness. ROM intact. No swelling nor tenderness of the  joints. +costovertebral tenderness Skin:  No notable scars, rashes, cruises. No bed sores noted.  Lymphatics: no palpable lymphadenopathy of cervical, supraclvicular nor inguinal areas.      Recent Labs Lab 05/15/15 0001 05/16/15 1711 05/17/15 0150  NA 136 127* 138  K 3.9 3.0* 4.4  CL 100 90* 111  CO2 23 18* 15*  BUN 21 38* 27*  CREATININE 1.24* 3.32* 2.04*  GLUCOSE 132* 118* 103*    Recent Labs Lab 05/15/15 0001 05/16/15 1711 05/17/15 0150  HGB 12.8 13.4 11.0*  HCT 38.3 39.0 32.2*  WBC 30.2* 18.4* 17.3*  PLT 183 84* 66*   Dg Chest Portable 1 View  05/16/2015  CLINICAL DATA:  Pt was seen yesterday and diagnosed with bladder infection. Having right lower flank pain with no associated dysuria. Emesis x1 today.Pt is tachypneic and WBC reported at 30.2. Also c/o RUQ chest pain, diarrhea, and dizziness. Nonsmoker. EXAM: PORTABLE CHEST 1 VIEW COMPARISON:  07/26/2014 FINDINGS: Prior median sternotomy. Mild right hemidiaphragm elevation. Midline trachea. Borderline cardiomegaly. No pleural effusion or pneumothorax. Small calcified right lung base nodules are likely related to old granulomatous disease. IMPRESSION: No acute cardiopulmonary disease. Electronically Signed   By: [name] M.D.   On: 05/16/2015 18:01   Ct Renal Stone Study  05/16/2015  CLINICAL DATA:  Patient was seen yesterday and diagnosed with bladder infection. Now having right lower flank pain. Vomiting today. White cell count of 30. Right upper chest pain. Diarrhea. Dizziness. EXAM: CT ABDOMEN AND PELVIS WITHOUT CONTRAST TECHNIQUE: Multidetector CT imaging of the abdomen and pelvis was performed following the standard protocol without IV contrast. COMPARISON:  None.  FINDINGS: Atelectasis in the lung bases. There is an ovoid stone in the mid right ureter at the level of the sacrum measuring 6 x 12 mm. There is another smaller stone in the mid right ureter measuring 5 mm diameter. There is prominent hydronephrosis and  hydroureter above the level of the stones. Stranding around the kidney and ureters is likely due to obstruction. Infection not excluded. No left renal or ureteral stones. No bladder stones or bladder wall thickening. No hydronephrosis or hydroureter on the left. The unenhanced appearance of the liver, spleen, adrenal glands, pancreas, abdominal aorta, inferior vena cava, and retroperitoneal lymph nodes is unremarkable. Small amount of fluid along the right pericolic gutter likely resulting from the urinary tract process. No free fluid or free air in the abdomen. Stomach, small bowel, and colon are not abnormally distended. Pelvis: Uterus and ovaries are not enlarged. No free or loculated pelvic fluid collections. No pelvic mass or lymphadenopathy. Bladder wall is not thickened. No destructive bone lesions. Vertebral hemangiomas at T12. IMPRESSION: 6 x 12 mm stone and 5 mm stone in the mid right ureter at the level of the sacrum with severe proximal obstructive change. Infiltration around the right kidney and ureter may be due to obstruction or infection. Electronically Signed   By: [name] M.D.   On: 05/16/2015 22:56    ASSESSMENT / PLAN: Severe sepsis 2/2 Pyelonephritis, Pos BC for Gram Neg Rods - leukocytosis, tachycardia, hypotension - s/p 4L IVF - agree with Zoysn as antibiotic choice  - f/u CT scan ordered by hospitalist to evaluate for stones. Going to OR for Stent 12/17 - serial lactates to ensure clearance (has decreased from 9.68 to 6.17 to 3.3) therefore is clearing - f/u blood cultures and urine culture, UA with many bacteria and large leukocytes. - thrombocytopenia is likely 2/2 sepsis - BP now stable 12/17  Acute kidney injury likely 2/2 ATN from sepsis +/- bactrim Lab Results  Component Value Date   CREATININE 2.04* 05/17/2015   CREATININE 3.32* 05/16/2015   CREATININE 1.24* 05/15/2015   CREATININE 0.94 01/08/2015   CREATININE 0.8 08/29/2013    Recent Labs Lab  05/15/15 0001 05/16/15 1711 05/17/15 0150  NA 136 127* 138     - trend renal function - monitor UOP - monitor hyponatremia - anion gap metabolic acidosis likely 2/2 lactic acidosis and AKI - unremarkable renal function and lytes yesterday.  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   We will follow post stent placement. May not need PCCM in near future once stent placed.  Mariel Sleet  Beeper  [phone]  Cell  [phone]  If no response or cell goes to voicemail, call beeper [phone]  CC 30 min  05/17/2015, 10:08 AM

## 2015-05-18 DIAGNOSIS — D72829 Elevated white blood cell count, unspecified: Secondary | ICD-10-CM | POA: Diagnosis present

## 2015-05-18 DIAGNOSIS — N131 Hydronephrosis with ureteral stricture, not elsewhere classified: Secondary | ICD-10-CM

## 2015-05-18 DIAGNOSIS — Z87442 Personal history of urinary calculi: Secondary | ICD-10-CM | POA: Diagnosis present

## 2015-05-18 DIAGNOSIS — A4151 Sepsis due to Escherichia coli [E. coli]: Secondary | ICD-10-CM | POA: Diagnosis present

## 2015-05-18 DIAGNOSIS — E871 Hypo-osmolality and hyponatremia: Secondary | ICD-10-CM

## 2015-05-18 DIAGNOSIS — R6520 Severe sepsis without septic shock: Secondary | ICD-10-CM

## 2015-05-18 DIAGNOSIS — D696 Thrombocytopenia, unspecified: Secondary | ICD-10-CM | POA: Diagnosis present

## 2015-05-18 DIAGNOSIS — D638 Anemia in other chronic diseases classified elsewhere: Secondary | ICD-10-CM | POA: Diagnosis present

## 2015-05-18 DIAGNOSIS — N179 Acute kidney failure, unspecified: Secondary | ICD-10-CM

## 2015-05-18 DIAGNOSIS — A4181 Sepsis due to Enterococcus: Secondary | ICD-10-CM

## 2015-05-18 LAB — BASIC METABOLIC PANEL
ANION GAP: 9 (ref 5–15)
BUN: 18 mg/dL (ref 6–20)
CALCIUM: 7.8 mg/dL — AB (ref 8.9–10.3)
CO2: 17 mmol/L — ABNORMAL LOW (ref 22–32)
CREATININE: 1.13 mg/dL — AB (ref 0.44–1.00)
Chloride: 119 mmol/L — ABNORMAL HIGH (ref 101–111)
GFR, EST AFRICAN AMERICAN: 58 mL/min — AB (ref >60)
GFR, EST NON AFRICAN AMERICAN: 50 mL/min — AB (ref >60)
Glucose, Bld: 103 mg/dL — ABNORMAL HIGH (ref 65–99)
Potassium: 4.1 mmol/L (ref 3.5–5.1)
SODIUM: 145 mmol/L (ref 135–145)

## 2015-05-18 LAB — CBC
HEMATOCRIT: 32.9 % — AB (ref 36.0–46.0)
Hemoglobin: 11 g/dL — ABNORMAL LOW (ref 12.0–15.0)
MCH: 30.6 pg (ref 26.0–34.0)
MCHC: 33.4 g/dL (ref 30.0–36.0)
MCV: 91.6 fL (ref 78.0–100.0)
PLATELETS: 104 10*3/uL — AB (ref 150–400)
RBC: 3.59 MIL/uL — ABNORMAL LOW (ref 3.87–5.11)
RDW: 13.3 % (ref 11.5–15.5)
WBC: 27.4 10*3/uL — AB (ref 4.0–10.5)

## 2015-05-18 LAB — URINE CULTURE: CULTURE: NO GROWTH

## 2015-05-18 LAB — HEPATIC FUNCTION PANEL
ALK PHOS: 126 U/L (ref 38–126)
ALT: 210 U/L — AB (ref 14–54)
AST: 155 U/L — ABNORMAL HIGH (ref 15–41)
Albumin: 2.6 g/dL — ABNORMAL LOW (ref 3.5–5.0)
BILIRUBIN DIRECT: 0.2 mg/dL (ref 0.1–0.5)
BILIRUBIN INDIRECT: 0.5 mg/dL (ref 0.3–0.9)
BILIRUBIN TOTAL: 0.7 mg/dL (ref 0.3–1.2)
TOTAL PROTEIN: 5.5 g/dL — AB (ref 6.5–8.1)

## 2015-05-18 LAB — APTT: APTT: 29 s (ref 24–37)

## 2015-05-18 LAB — PROTIME-INR
INR: 1.36 (ref 0.00–1.49)
PROTHROMBIN TIME: 16.9 s — AB (ref 11.6–15.2)

## 2015-05-18 LAB — MAGNESIUM: Magnesium: 2.1 mg/dL (ref 1.7–2.4)

## 2015-05-18 LAB — PHOSPHORUS: PHOSPHORUS: 2 mg/dL — AB (ref 2.5–4.6)

## 2015-05-18 NOTE — Progress Notes (Addendum)
[facility] PROGRESS NOTE  Alison Zimmerman PYP:[id number] DOB: 1951/01/12 DOA: 05/16/2015 PCP: [name], MD  Brief narrative:    64 y.o. Female, a Jehovah's witness (agreeable to FFP and platelets transfusion), history of breast cancer (used to follow with [name]) using alternative medicine for treatment such as juicing. Pt presented to PCP office 05/15/2015 with urinary frequency, urgency and right flank pain for a week prior tot his admission. Her symptoms were associated with vomiting. She was prescribed septra and sent home. She came back the following day for recheck and her blood work ultimately came back with WBC count of 30 so she presented to [facility] ED for further evaluation.  On admission, she was hypotensive, tachycardic, tachypneic, afebrile. He WBC count was 18.4, platelets 84 -->66, hemoglobin 11. Creatinine was 3.32, lactic acid 9.68, procalcitonin level 62.97. Sodium was 127 and potassium 3.0. CXR showed no acute cardiopulmonary process.  CT renal stone study w/o contrast demonstrated 6 x 12 mm stone and 5 mm stone in the mid right ureter at the level of the sacrum with severe proximal obstructive change and infiltration around the right kidney and ureter may be due to obstruction or infection. GU as then consulted for attempt to place the stent. She underwent cystoscopy with right ureteral stent placement 05/17/2015. Post procedure she was transferred to SDU. Currently she is hemodynamically stable and will be transferred to telemetry.   Assessment/Plan:    Principal Problem: Severe sepsis due to Escherichia coli with acute renal failure (HCC) / Pyelonephritis / Leukocytosis  - Severe sepsis criteria met on admission with hypotension, tachycardia, tachypnea, evidence of UTI on urinalysis and now urine culture growing E.Coli. Lactic acid was 9.68 and procalcitonin was 62.97.  - She was started on zosyn, continue for now  - Blood cultures so far negative  -  Hemodynamically stable and does not require pressor support - May transfer to telemetry floor today   Active Problems: Severe hydroureteronephrosis, right  - S/P right ureteral stent placement 05/17/2015 - GU is following  Acute renal failure - Likely from combination of septra, sepsis, severe hydroureteronephrosis  - Cr as high as 3.32 on admission but after stent placement and with hydration her Cr has tremendously improved to 1.13 - Continue to monitor BMP  Ductal carcinoma in situ (DCIS) of right breast - Has not really followed up recently with oncology - On alternative treatments at home  Anemia of chronic disease / Jehovah's witness  - Likely from history of malignancy - Hemoglobin stable   Thrombocytopenia - Likely combination of septra and sepsis as well as history of malignancy - Platelets now stable, 104 - Agreeable for FFP and platelet transfusion   Hyponatremia - Due to prerenal etiology, sepsis - Improved with fluids and sodium now WNL  Hypokalemia - Due to prerenal etiology, sepsis - Supplemented    DVT Prophylaxis  - SCD's bilaterally    Code Status: Full.  Family Communication:  plan of care discussed with the patient Disposition Plan: transfer to tele today. Home once sepsis etiology resolves.   IV access:  Peripheral IV  Procedures and diagnostic studies:    1. Cystourethroscopy 2. Right retrograde pyelogram 3. Right ureteral stent placement, 12F x 24 cm JJ ureteral stent without string 05/17/2015  Dg Chest Portable 1 View 05/16/2015 No acute cardiopulmonary disease. Electronically Signed   By: [name] M.D.   On: 05/16/2015 18:01   Ct Renal Stone Study 05/16/2015   6 x 12 mm stone and  5 mm stone in the mid right ureter at the level of the sacrum with severe proximal obstructive change. Infiltration around the right kidney and ureter may be due to obstruction or infection. Electronically Signed   By: [name] M.D.   On: 05/16/2015  22:56   US Abdomen Limited Ruq 05/17/2015  1. Diffuse gallbladder wall thickening. No stones. Negative sonographic Murphy's sign. 2. Right pleural effusion. Electronically Signed   By: [name] M.D.   On: 05/17/2015 18:15    Medical Consultants:  GU CCM  Other Consultants:  None   IAnti-Infectives:   Zosyn 05/16/2015 -->    [name], MD  [facility] Pager [phone]  Time spent in minutes: 25 minutes  If 7PM-7AM, please contact night-coverage [email or web address] Password [facility] 05/18/2015, 9:14 AM   LOS: 2 days    HPI/Subjective: No acute overnight events. Patient reports she feels better this am.   Objective: Filed Vitals:   05/18/15 0000 05/18/15 0400 05/18/15 0600 05/18/15 0800  BP: 110/77 116/64 114/66 125/75  Pulse: 102 95 96 116  Temp: 98.3 F (36.8 C) 98.5 F (36.9 C)  98.4 F (36.9 C)  TempSrc: Oral Oral  Oral  Resp: _0 Height:      Weight:      SpO2: 97% 99% 97% 98%    Intake/Output Summary (Last 24 hours) at 05/18/15 0914 Last data filed at 05/18/15 0800  Gross per 24 hour  Intake   3825 ml  Output   1175 ml  Net   2650 ml    Exam:   General:  Pt is alert, follows commands appropriately, not in acute distress  Cardiovascular: Regular rate and rhythm, S1/S2 (+)  Respiratory: Clear to auscultation bilaterally, no wheezing, no crackles, no rhonchi  Abdomen: Soft, non tender, non distended, bowel sounds present  Extremities: No edema, pulses DP and PT palpable bilaterally  Neuro: Grossly nonfocal  Data Reviewed: Basic Metabolic Panel:  Recent Labs Lab 05/15/15 0001 05/16/15 1711 05/17/15 0150 05/18/15 0400  NA 136 127* 138 145  K 3.9 3.0* 4.4 4.1  CL 100 90* 111 119*  CO2 23 18* 15* 17*  GLUCOSE 132* 118* 103* 103*  BUN 21 38* 27* 18  CREATININE 1.24* 3.32* 2.04* 1.13*  CALCIUM 9.0 8.1* 7.1* 7.8*  MG  --   --  1.5* 2.1  PHOS  --   --  2.7 2.0*   Liver Function Tests:  Recent Labs Lab 05/15/15 0001  05/16/15 1711 05/17/15 0150 05/18/15 0400  AST 26 67* 92* 155*  ALT 20 40 71* 210*  ALKPHOS 71 240* 99 126  BILITOT 0.9 1.3* 1.1 0.7  PROT 6.9 7.4 5.8* 5.5*  ALBUMIN 4.1 3.9 2.8* 2.6*   No results for input(s): LIPASE, AMYLASE in the last 168 hours. No results for input(s): AMMONIA in the last 168 hours. CBC:  Recent Labs Lab 05/15/15 0001 05/16/15 1711 05/17/15 0150 05/18/15 0400  WBC 30.2* 18.4* 17.3* 27.4*  NEUTROABS 28.7* 17.8* 16.8*  --   HGB 12.8 13.4 11.0* 11.0*  HCT 38.3 39.0 32.2* 32.9*  MCV 90.8 91.1 91.5 91.6  PLT 183 84* 66* 104*   Cardiac Enzymes: No results for input(s): CKTOTAL, CKMB, CKMBINDEX, TROPONINI in the last 168 hours. BNP: Invalid input(s): POCBNP CBG: No results for input(s): GLUCAP in the last 168 hours.  Urine culture     Status: None (Preliminary result)   Collection Time: 05/16/15  5:28 PM  Result Value Ref Range  Status   Specimen Description URINE, CLEAN CATCH  Final   Special Requests NONE  Final   Culture   Final    >=100,000 COLONIES/mL ESCHERICHIA COLI    Report Status PENDING  Incomplete  Blood Culture (routine x 2)     Status: None (Preliminary result)   Collection Time: 05/16/15  6:58 PM  Result Value Ref Range Status   Specimen Description BLOOD LEFT WRIST  Final   Special Requests IN PEDIATRIC BOTTLE 3CC  Final   Culture   Final    NO GROWTH < 24 HOURS Performed at [facility]    Report Status PENDING  Incomplete  MRSA PCR Screening     Status: None   Collection Time: 05/17/15  1:35 AM  Result Value Ref Range Status   MRSA by PCR NEGATIVE NEGATIVE Final     Scheduled Meds: . docusate sodium  100 mg Oral BID  . piperacillin-tazobactam (ZOSYN)  IV  3.375 g Intravenous 3 times per day  . senna  1 tablet Oral BID  . sodium chloride  3 mL Intravenous Q12H   Continuous Infusions: . sodium chloride 75 mL/hr at 05/18/15 0137

## 2015-05-18 NOTE — Progress Notes (Signed)
No events overnight Patient reports feeling much better No n/v/f/c WBC remains elevated Pt remains tachy Cr improved E. Coli in urine  Filed Vitals:   05/18/15 0400 05/18/15 0600 05/18/15 0800 05/18/15 1000  BP: 116/64 114/66 125/75 123/70  Pulse: 95 96 116 107  Temp: 98.5 F (36.9 C)  98.4 F (36.9 C)   TempSrc: Oral  Oral   Resp: 22 27 22 20   Height:      Weight:      SpO2: 99% 97% 98% 100%   I/O last 3 completed shifts: In: 3895 [I.V.:2685; Other:860; IV Piggyback:350] Out: [id number] [Urine:2375] Total I/O In: 345 [P.O.:120; I.V.:225] Out: -    NAD Soft NT ND Foley clear yellow urine  CBC    Component Value Date/Time   WBC 27.4* 05/18/2015 0400   WBC 7.6 08/29/2013 1221   RBC 3.59* 05/18/2015 0400   RBC 4.51 08/29/2013 1221   HGB 11.0* 05/18/2015 0400   HGB 13.7 08/29/2013 1221   HCT 32.9* 05/18/2015 0400   HCT 42.1 08/29/2013 1221   PLT 104* 05/18/2015 0400   PLT 273 08/29/2013 1221   MCV 91.6 05/18/2015 0400   MCV 93.3 08/29/2013 1221   MCH 30.6 05/18/2015 0400   MCH 30.3 08/29/2013 1221   MCHC 33.4 05/18/2015 0400   MCHC 32.4 08/29/2013 1221   RDW 13.3 05/18/2015 0400   RDW 12.5 08/29/2013 1221   LYMPHSABS 0.3* 05/17/2015 0150   LYMPHSABS 1.7 08/29/2013 1221   MONOABS 0.2 05/17/2015 0150   MONOABS 0.4 08/29/2013 1221   EOSABS 0.0 05/17/2015 0150   EOSABS 0.0 08/29/2013 1221   BASOSABS 0.0 05/17/2015 0150   BASOSABS 0.0 08/29/2013 1221   BMP Latest Ref Rng 05/18/2015 05/17/2015 05/16/2015  Glucose 65 - 99 mg/dL 103(H) 103(H) 118(H)  BUN 6 - 20 mg/dL 18 27(H) 38(H)  Creatinine 0.44 - 1.00 mg/dL 1.13(H) 2.04(H) 3.32(H)  Sodium 135 - 145 mmol/L 145 138 127(L)  Potassium 3.5 - 5.1 mmol/L 4.1 4.4 3.0(L)  Chloride 101 - 111 mmol/L 119(H) 111 90(L)  CO2 22 - 32 mmol/L 17(L) 15(L) 18(L)  Calcium 8.9 - 10.3 mg/dL 7.8(L) 7.1(L) 8.1(L)    POD 1 right ureteral stent for septic stone. Improving -continue broad spectrum abx pending c/s -continue foley  until tachycardia/elevated WBC resolve -will need outpt definitive stone management after infection resolves -will follow

## 2015-05-19 ENCOUNTER — Encounter (HOSPITAL_COMMUNITY): Payer: Self-pay | Admitting: Urology

## 2015-05-19 DIAGNOSIS — D696 Thrombocytopenia, unspecified: Secondary | ICD-10-CM

## 2015-05-19 DIAGNOSIS — B9620 Unspecified Escherichia coli [E. coli] as the cause of diseases classified elsewhere: Secondary | ICD-10-CM | POA: Diagnosis present

## 2015-05-19 DIAGNOSIS — D638 Anemia in other chronic diseases classified elsewhere: Secondary | ICD-10-CM

## 2015-05-19 DIAGNOSIS — R7881 Bacteremia: Secondary | ICD-10-CM

## 2015-05-19 LAB — CULTURE, BLOOD (ROUTINE X 2)

## 2015-05-19 LAB — CBC
HEMATOCRIT: 38.2 % (ref 36.0–46.0)
Hemoglobin: 12.8 g/dL (ref 12.0–15.0)
MCH: 30.6 pg (ref 26.0–34.0)
MCHC: 33.5 g/dL (ref 30.0–36.0)
MCV: 91.4 fL (ref 78.0–100.0)
Platelets: 134 10*3/uL — ABNORMAL LOW (ref 150–400)
RBC: 4.18 MIL/uL (ref 3.87–5.11)
RDW: 13.6 % (ref 11.5–15.5)
WBC: 35.8 10*3/uL — AB (ref 4.0–10.5)

## 2015-05-19 LAB — HEPATITIS PANEL, ACUTE
HCV Ab: <0.1 s/co ratio (ref 0.0–0.9)
Hep A IgM: NEGATIVE
Hep B C IgM: NEGATIVE
Hepatitis B Surface Ag: NEGATIVE

## 2015-05-19 MED ORDER — CEFAZOLIN SODIUM-DEXTROSE 2-3 GM-% IV SOLR
2.0000 g | Freq: Three times a day (TID) | INTRAVENOUS | Status: DC
  Administered 2015-05-19 – 2015-05-23 (×13): 2 g via INTRAVENOUS
  Filled 2015-05-19 (×13): qty 50

## 2015-05-19 NOTE — Progress Notes (Signed)
No events overnight Patient doing much better   Filed Vitals:   05/18/15 1300 05/18/15 2106 05/19/15 0239 05/19/15 0548  BP: 114/67 123/64 141/86 134/76  Pulse: 109 110 86 82  Temp: 98 F (36.7 C) 98.2 F (36.8 C) 98.1 F (36.7 C) 98.6 F (37 C)  TempSrc: Oral Oral Oral Oral  Resp: 22 22 20 20   Height:      Weight:      SpO2: 100% 97% 100% 100%   I/O last 3 completed shifts: In: [id number] [P.O.:800; I.V.:2100; Other:1400; IV Piggyback:150] Out: 500 [Urine:500]     NAD Soft NT ND Foley clear yellow urine  CBC    Component Value Date/Time   WBC 27.4* 05/18/2015 0400   WBC 7.6 08/29/2013 1221   RBC 3.59* 05/18/2015 0400   RBC 4.51 08/29/2013 1221   HGB 11.0* 05/18/2015 0400   HGB 13.7 08/29/2013 1221   HCT 32.9* 05/18/2015 0400   HCT 42.1 08/29/2013 1221   PLT 104* 05/18/2015 0400   PLT 273 08/29/2013 1221   MCV 91.6 05/18/2015 0400   MCV 93.3 08/29/2013 1221   MCH 30.6 05/18/2015 0400   MCH 30.3 08/29/2013 1221   MCHC 33.4 05/18/2015 0400   MCHC 32.4 08/29/2013 1221   RDW 13.3 05/18/2015 0400   RDW 12.5 08/29/2013 1221   LYMPHSABS 0.3* 05/17/2015 0150   LYMPHSABS 1.7 08/29/2013 1221   MONOABS 0.2 05/17/2015 0150   MONOABS 0.4 08/29/2013 1221   EOSABS 0.0 05/17/2015 0150   EOSABS 0.0 08/29/2013 1221   BASOSABS 0.0 05/17/2015 0150   BASOSABS 0.0 08/29/2013 1221   BMP Latest Ref Rng 05/18/2015 05/17/2015 05/16/2015  Glucose 65 - 99 mg/dL 103(H) 103(H) 118(H)  BUN 6 - 20 mg/dL 18 27(H) 38(H)  Creatinine 0.44 - 1.00 mg/dL 1.13(H) 2.04(H) 3.32(H)  Sodium 135 - 145 mmol/L 145 138 127(L)  Potassium 3.5 - 5.1 mmol/L 4.1 4.4 3.0(L)  Chloride 101 - 111 mmol/L 119(H) 111 90(L)  CO2 22 - 32 mmol/L 17(L) 15(L) 18(L)  Calcium 8.9 - 10.3 mg/dL 7.8(L) 7.1(L) 8.1(L)    Urine culture: e. Coli sensitive to cipro  POD 2 right ureteral stent for septic stone. Improving -d/c foley -ok for d/c home on PO antibiotic from a urology standpoint -f/u 1-2 weeks as outpatient to  discuss definitive stone management

## 2015-05-19 NOTE — Progress Notes (Signed)
Pharmacy Antibiotic Follow-up Note  Alison Zimmerman is a 64 y.o. year-old female admitted on 05/16/2015.  The patient is currently on day 4 of Zosyn for bacteremia and UTI.  Assessment/Plan:  This patient's current antibiotics will be continued without adjustments.  Recommend to narrow antibiotics to Cefazolin 2g IV q8h based on cultures and susceptibilities.  Temp (24hrs), Avg:98.2 F (36.8 C), Min:98 F (36.7 C), Max:98.6 F (37 C)   Recent Labs Lab 05/15/15 0001 05/16/15 1711 05/17/15 0150 05/18/15 0400 05/19/15 1011  WBC 30.2* 18.4* 17.3* 27.4* 35.8*    Recent Labs Lab 05/15/15 0001 05/16/15 1711 05/17/15 0150 05/18/15 0400  CREATININE 1.24* 3.32* 2.04* 1.13*   Estimated Creatinine Clearance: 40.4 mL/min (by C-G formula based on Cr of 1.13).    Allergies  Allergen Reactions  . Other     No blood products. 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 Honeydew    Antimicrobials this admission: 12/16 >> Ceftriaxone x 1 12/17 >> Ceftazidime >> 12/17 12/17 >> Zosyn >>  Levels/dose changes this admission: None  Microbiology results: 12/16 Blood x2: 1/2 NGTD; 1/2 with Ecoli (Sens: cefazolin, cefepime, ceftaz, CTX, cipro, gent, imipenem, pip/tazo. Resist/interm: amp, amp/sul, trim/sulf) 12/16 urine: >/= 100,000 Ecoli (Sens: cefazolin, CTX, cipro, gent, imipenem, NTF, zosyn. Resist/interm: amp, amp/sul, trim/sulf) 12/17 urine: NGF 12/17 MRSA PCR: neg  Thank you for allowing pharmacy to be a part of this patient's care. Gretta Arab PharmD, BCPS Pager [phone] 05/19/2015 11:15 AM

## 2015-05-19 NOTE — Progress Notes (Signed)
[facility] PROGRESS NOTE  Alison Zimmerman:[id number] DOB: 20-Oct-1950 DOA: 05/16/2015 PCP: [name], MD  Brief narrative:    64 y.o. Female, a Jehovah's witness (agreeable to FFP and platelets transfusion), history of breast cancer (used to follow with [name]) using alternative medicine for treatment such as juicing. Pt presented to PCP office 05/15/2015 with urinary frequency, urgency and right flank pain for a week prior tot his admission. Her symptoms were associated with vomiting. She was prescribed septra and sent home. She came back the following day for recheck and her blood work ultimately came back with WBC count of 30 so she presented to [facility] ED for further evaluation.  On admission, she was hypotensive, tachycardic, tachypneic, afebrile. He WBC count was 18.4, platelets 84 -->66, hemoglobin 11. Creatinine was 3.32, lactic acid 9.68, procalcitonin level 62.97. Sodium was 127 and potassium 3.0. CXR showed no acute cardiopulmonary process.  CT renal stone study w/o contrast demonstrated 6 x 12 mm stone and 5 mm stone in the mid right ureter at the level of the sacrum with severe proximal obstructive change and infiltration around the right kidney and ureter may be due to obstruction or infection. GU as then consulted for attempt to place the stent. She underwent cystoscopy with right ureteral stent placement 05/17/2015. Post procedure she was transferred to SDU.  Transferred to telemetry floor 05/18/2015. Hospital course complicated with finding of Escherichia coli UTI and bacteremia.  Assessment/Plan:    Principal Problem: Severe sepsis due to Escherichia coli with acute renal failure (HCC) / Pyelonephritis / Escherichia coli UTI / Escherichia coli bacteremia / Leukocytosis  - Severe sepsis criteria met on admission with hypotension, tachycardia, tachypnea - Lactic acid was 9.68 and procalcitonin was 62.97 - Source of infection is Escherichia coli UTI and  Escherichia coli bacteremia - Repeat blood cultures today to ensure clearance of bacteremia - Continue Zosyn for now - Unclear why leukocytosis is worse today. She did not spike fever overnight. Will check CBC tomorrow morning and if it's worse then will consider starting vancomycin.  Active Problems: Severe hydroureteronephrosis, right  - S/P right ureteral stent placement 05/17/2015 - Patient will follow up with the GU on outpatient basis  Acute renal failure - Likely from combination of septra, sepsis, severe hydroureteronephrosis  - Cr as high as 3.32 on admission but after stent placement and with hydration her Cr has tremendously improved to 1.13 - Follow-up BMP tomorrow morning  Ductal carcinoma in situ (DCIS) of right breast - Has not really followed up recently with oncology - On alternative treatments at home  Anemia of chronic disease / Jehovah's witness  - Likely secondary to history of malignancy - Hemoglobin is now within normal limits.  Thrombocytopenia - Likely combination of septra and sepsis as well as history of malignancy - Platelets now stable, 104, 134 - Agreeable for FFP and platelet transfusion   Hyponatremia - Due to prerenal etiology, sepsis - Improved with fluids and sodium now WNL  Hypokalemia - Due to prerenal etiology, sepsis - Supplemented  - Check potassium level tomorrow morning   DVT Prophylaxis  - SCD's bilaterally in hospital   Code Status: Full.  Family Communication:  plan of care discussed with the patient Disposition Plan: Home once sepsis etiology resolves. Unclear at this point when will she go home.  IV access:  Peripheral IV  Procedures and diagnostic studies:    1. Cystourethroscopy 2. Right retrograde pyelogram 3. Right ureteral stent placement, 32F x 24 cm JJ  ureteral stent without string 05/17/2015  Dg Chest Portable 1 View 05/16/2015 No acute cardiopulmonary disease. Electronically Signed   By: [name] M.D.   On:  05/16/2015 18:01   Ct Renal Stone Study 05/16/2015   6 x 12 mm stone and 5 mm stone in the mid right ureter at the level of the sacrum with severe proximal obstructive change. Infiltration around the right kidney and ureter may be due to obstruction or infection. Electronically Signed   By: [name] M.D.   On: 05/16/2015 22:56   US Abdomen Limited Ruq 05/17/2015  1. Diffuse gallbladder wall thickening. No stones. Negative sonographic Murphy's sign. 2. Right pleural effusion. Electronically Signed   By: [name] M.D.   On: 05/17/2015 18:15    Medical Consultants:  GU CCM  Other Consultants:  None   IAnti-Infectives:   Zosyn 05/16/2015 -->    [name], MD  [facility] Pager [phone]  Time spent in minutes: 25 minutes  If 7PM-7AM, please contact night-coverage [email or web address] Password [email or web address] 05/19/2015, 11:29 AM   LOS: 3 days    HPI/Subjective: No acute overnight events. Patient reports she is tired.   Objective: Filed Vitals:   05/18/15 1300 05/18/15 2106 05/19/15 0239 05/19/15 0548  BP: 114/67 123/64 141/86 134/76  Pulse: 109 110 86 82  Temp: 98 F (36.7 C) 98.2 F (36.8 C) 98.1 F (36.7 C) 98.6 F (37 C)  TempSrc: Oral Oral Oral Oral  Resp: '22 22 20 20  ' Height:      Weight:      SpO2: 100% 97% 100% 100%    Intake/Output Summary (Last 24 hours) at 05/19/15 1129 Last data filed at 05/19/15 0300  Gross per 24 hour  Intake   1730 ml  Output    500 ml  Net   1230 ml    Exam:   General:  Pt is alert, not in acute distress  Cardiovascular: RRR, S1/S2 appreciated   Respiratory: No wheezing, no crackles, no rhonchi  Abdomen: (+) BS, non tender   Extremities: No leg swelling, palpable pulses   Neuro: No focal deficits   Data Reviewed: Basic Metabolic Panel:  Recent Labs Lab 05/15/15 0001 05/16/15 1711 05/17/15 0150 05/18/15 0400  NA 136 127* 138 145  K 3.9 3.0* 4.4 4.1  CL 100 90* 111 119*  CO2 23 18* 15* 17*  GLUCOSE  132* 118* 103* 103*  BUN 21 38* 27* 18  CREATININE 1.24* 3.32* 2.04* 1.13*  CALCIUM 9.0 8.1* 7.1* 7.8*  MG  --   --  1.5* 2.1  PHOS  --   --  2.7 2.0*   Liver Function Tests:  Recent Labs Lab 05/15/15 0001 05/16/15 1711 05/17/15 0150 05/18/15 0400  AST 26 67* 92* 155*  ALT 20 40 71* 210*  ALKPHOS 71 240* 99 126  BILITOT 0.9 1.3* 1.1 0.7  PROT 6.9 7.4 5.8* 5.5*  ALBUMIN 4.1 3.9 2.8* 2.6*   No results for input(s): LIPASE, AMYLASE in the last 168 hours. No results for input(s): AMMONIA in the last 168 hours. CBC:  Recent Labs Lab 05/15/15 0001 05/16/15 1711 05/17/15 0150 05/18/15 0400 05/19/15 1011  WBC 30.2* 18.4* 17.3* 27.4* 35.8*  NEUTROABS 28.7* 17.8* 16.8*  --   --   HGB 12.8 13.4 11.0* 11.0* 12.8  HCT 38.3 39.0 32.2* 32.9* 38.2  MCV 90.8 91.1 91.5 91.6 91.4  PLT 183 84* 66* 104* 134*   Cardiac Enzymes: No results for input(s): CKTOTAL, CKMB, CKMBINDEX,  TROPONINI in the last 168 hours. BNP: Invalid input(s): POCBNP CBG: No results for input(s): GLUCAP in the last 168 hours.  Blood Culture (routine x 2)     Status: None   Collection Time: 05/16/15  5:02 PM  Result Value Ref Range Status   Specimen Description BLOOD LEFT HAND  Final   Special Requests IN PEDIATRIC BOTTLE 2CC  Final   Culture  Setup Time   Final   Culture   Final    ESCHERICHIA COLI Performed at [facility]    Report Status 05/19/2015 FINAL  Final   Organism ID, Bacteria ESCHERICHIA COLI  Final      Susceptibility   Escherichia coli - MIC*    AMPICILLIN >=32 RESISTANT Resistant     CEFAZOLIN <=4 SENSITIVE Sensitive     CEFEPIME <=1 SENSITIVE Sensitive     CEFTAZIDIME <=1 SENSITIVE Sensitive     CEFTRIAXONE <=1 SENSITIVE Sensitive     CIPROFLOXACIN <=0.25 SENSITIVE Sensitive     GENTAMICIN <=1 SENSITIVE Sensitive     IMIPENEM <=0.25 SENSITIVE Sensitive     TRIMETH/SULFA >=320 RESISTANT Resistant     AMPICILLIN/SULBACTAM 16 INTERMEDIATE Intermediate     PIP/TAZO <=4  SENSITIVE Sensitive     * ESCHERICHIA COLI  Urine culture     Status: None   Collection Time: 05/16/15  5:28 PM  Result Value Ref Range Status   Specimen Description URINE, CLEAN CATCH  Final   Special Requests NONE  Final   Culture   Final   Report Status 05/18/2015 FINAL  Final   Organism ID, Bacteria ESCHERICHIA COLI  Final      Susceptibility   Escherichia coli - MIC*    AMPICILLIN >=32 RESISTANT Resistant     CEFAZOLIN <=4 SENSITIVE Sensitive     CEFTRIAXONE <=1 SENSITIVE Sensitive     CIPROFLOXACIN <=0.25 SENSITIVE Sensitive     GENTAMICIN <=1 SENSITIVE Sensitive     IMIPENEM <=0.25 SENSITIVE Sensitive     NITROFURANTOIN <=16 SENSITIVE Sensitive     TRIMETH/SULFA >=320 RESISTANT Resistant     AMPICILLIN/SULBACTAM 16 INTERMEDIATE Intermediate     PIP/TAZO <=4 SENSITIVE Sensitive     * >=100,000 COLONIES/mL ESCHERICHIA COLI  Blood Culture (routine x 2)     Status: None (Preliminary result)   Collection Time: 05/16/15  6:58 PM  Result Value Ref Range Status   Specimen Description BLOOD LEFT WRIST  Final   Special Requests IN PEDIATRIC BOTTLE 3CC  Final   Culture   Final    NO GROWTH 2 DAYS Performed at [facility]    Report Status PENDING  Incomplete  MRSA PCR Screening     Status: None   Collection Time: 05/17/15  1:35 AM  Result Value Ref Range Status   MRSA by PCR NEGATIVE NEGATIVE Final  Urine culture     Status: None   Collection Time: 05/17/15 12:04 PM  Result Value Ref Range Status   Specimen Description URINE, CATHETERIZED  Final   Special Requests NONE  Final   Culture   Final    NO GROWTH 1 DAY Performed at [facility]    Report Status 05/18/2015 FINAL  Final     Scheduled Meds: . docusate sodium  100 mg Oral BID  . piperacillin-tazobactam (ZOSYN)  IV  3.375 g Intravenous 3 times per day  . senna  1 tablet Oral BID  . sodium chloride  3 mL Intravenous Q12H   Continuous Infusions: . sodium chloride 75  mL/hr at 05/18/15 0137

## 2015-05-20 LAB — CBC
HEMATOCRIT: 42.3 % (ref 36.0–46.0)
Hemoglobin: 14.1 g/dL (ref 12.0–15.0)
MCH: 30.5 pg (ref 26.0–34.0)
MCHC: 33.3 g/dL (ref 30.0–36.0)
MCV: 91.6 fL (ref 78.0–100.0)
Platelets: 143 10*3/uL — ABNORMAL LOW (ref 150–400)
RBC: 4.62 MIL/uL (ref 3.87–5.11)
RDW: 13.5 % (ref 11.5–15.5)
WBC: 28.1 10*3/uL — AB (ref 4.0–10.5)

## 2015-05-20 NOTE — Progress Notes (Signed)
[facility] PROGRESS NOTE  Alison Zimmerman RXV:[id number] DOB: 06-28-1950 DOA: 05/16/2015 PCP: [name], MD  Brief narrative:    64 y.o. Female, a Jehovah's witness (agreeable to FFP and platelets transfusion), history of breast cancer (used to follow with [name]) using alternative medicine for treatment such as juicing. Pt presented to PCP office 05/15/2015 with urinary frequency, urgency and right flank pain for a week prior tot his admission. Her symptoms were associated with vomiting. She was prescribed septra and sent home. She came back the following day for recheck and her blood work ultimately came back with WBC count of 30 so she presented to [facility] ED for further evaluation.  On admission, she was hypotensive, tachycardic, tachypneic, afebrile. He WBC count was 18.4, platelets 84 -->66, hemoglobin 11. Creatinine was 3.32, lactic acid 9.68, procalcitonin level 62.97. Sodium was 127 and potassium 3.0. CXR showed no acute cardiopulmonary process.  CT renal stone study w/o contrast demonstrated 6 x 12 mm stone and 5 mm stone in the mid right ureter at the level of the sacrum with severe proximal obstructive change and infiltration around the right kidney and ureter may be due to obstruction or infection. GU as then consulted for attempt to place the stent. She underwent cystoscopy with right ureteral stent placement 05/17/2015. Post procedure she was transferred to SDU.  Transferred to telemetry floor 05/18/2015. Hospital course complicated with finding of Escherichia coli UTI and bacteremia.  Assessment/Plan:    Principal Problem: Severe sepsis due to Escherichia coli with acute renal failure (HCC) / Pyelonephritis / Escherichia coli UTI / Escherichia coli bacteremia / Leukocytosis  - Severe sepsis criteria met on admission with hypotension, tachycardia, tachypnea, elevated lactic acid at 9.68 and elevated pro calcitonin level at 62.97 - Source of infection is E coli  bacteremia and urinary tract infection. - Follow-up repeat blood cultures - they are pending as of 05/20/2015 - Patient was initially on Zosyn but we changed that to Ancef per sensitivity report on 05/19/2015 - Leukocytosis is improving.  Active Problems: Severe hydroureteronephrosis, right  - S/P right ureteral stent placement 05/17/2015 - Patient will follow up with the GU on outpatient basis - Voiding freely.  Acute renal failure - Likely from combination of septra, sepsis, severe hydroureteronephrosis  - Cr as high as 3.32 on admission but after stent placement and with hydration her Cr has tremendously improved to 1.13  Ductal carcinoma in situ (DCIS) of right breast - Has not really followed up recently with oncology - Patient is on alternative treatments at home  Anemia of chronic disease / Jehovah's witness  - Likely secondary to history of malignancy. - Hemoglobin remains within normal limits. No reports of bleeding.  Thrombocytopenia - Likely combination of septra and sepsis as well as history of malignancy - Platelets now stable, 104, 134, 143 - Agreeable for FFP and platelet transfusion if needed   Hyponatremia - Due to prerenal etiology, sepsis - Improved with hydration  Hypokalemia - Due to prerenal etiology, sepsis - Supplemented    DVT Prophylaxis  - SCD's bilaterally   Code Status: Full.  Family Communication:  plan of care discussed with the patient Disposition Plan: Home once sepsis etiology resolves. Unclear at this point when will she go home.  IV access:  Peripheral IV  Procedures and diagnostic studies:    1. Cystourethroscopy 2. Right retrograde pyelogram 3. Right ureteral stent placement, 66F x 24 cm JJ ureteral stent without string 05/17/2015  Dg Chest Portable 1 View 05/16/2015  No acute cardiopulmonary disease. Electronically Signed   By: [name] M.D.   On: 05/16/2015 18:01   Ct Renal Stone Study 05/16/2015   6 x 12 mm stone and 5 mm  stone in the mid right ureter at the level of the sacrum with severe proximal obstructive change. Infiltration around the right kidney and ureter may be due to obstruction or infection. Electronically Signed   By: [name] M.D.   On: 05/16/2015 22:56   US Abdomen Limited Ruq 05/17/2015  1. Diffuse gallbladder wall thickening. No stones. Negative sonographic Murphy's sign. 2. Right pleural effusion. Electronically Signed   By: [name] M.D.   On: 05/17/2015 18:15    Medical Consultants:  GU CCM  Other Consultants:  None   IAnti-Infectives:   Zosyn 05/16/2015 --> 05/19/2015 Ancef 05/19/2015 -->   [name], MD  [facility] Pager [phone]  Time spent in minutes: 25 minutes  If 7PM-7AM, please contact night-coverage [email or web address] Password [email or web address] 05/20/2015, 10:59 AM   LOS: 4 days    HPI/Subjective: No acute overnight events. Patient reports she feels weak. No nausea or vomiting.  Objective: Filed Vitals:   05/19/15 0548 05/19/15 1341 05/19/15 2208 05/20/15 0532  BP: 134/76 115/57 122/73 125/73  Pulse: 82 82 93 91  Temp: 98.6 F (37 C) 98.1 F (36.7 C) 98.1 F (36.7 C) 98.1 F (36.7 C)  TempSrc: Oral Oral Oral Oral  Resp: '20 20 20 20  '$ Height:      Weight:      SpO2: 100% 100% 99% 98%    Intake/Output Summary (Last 24 hours) at 05/20/15 1059 Last data filed at 05/20/15 0533  Gross per 24 hour  Intake    130 ml  Output    400 ml  Net   -270 ml    Exam:   General:  Pt is alert, awake, no acute distress  Cardiovascular: Rate controlled, appreciate S1-S2  Respiratory: Bilateral air entry, no wheezing  Abdomen: Nontender abdomen, appreciate bowel sounds  Extremities: Nonpitting lower extremity edema, pulses palpable  Neuro: Nonfocal  Data Reviewed: Basic Metabolic Panel:  Recent Labs Lab 05/15/15 0001 05/16/15 1711 05/17/15 0150 05/18/15 0400  NA 136 127* 138 145  K 3.9 3.0* 4.4 4.1  CL 100 90* 111 119*  CO2 23 18* 15*  17*  GLUCOSE 132* 118* 103* 103*  BUN 21 38* 27* 18  CREATININE 1.24* 3.32* 2.04* 1.13*  CALCIUM 9.0 8.1* 7.1* 7.8*  MG  --   --  1.5* 2.1  PHOS  --   --  2.7 2.0*   Liver Function Tests:  Recent Labs Lab 05/15/15 0001 05/16/15 1711 05/17/15 0150 05/18/15 0400  AST 26 67* 92* 155*  ALT 20 40 71* 210*  ALKPHOS 71 240* 99 126  BILITOT 0.9 1.3* 1.1 0.7  PROT 6.9 7.4 5.8* 5.5*  ALBUMIN 4.1 3.9 2.8* 2.6*   No results for input(s): LIPASE, AMYLASE in the last 168 hours. No results for input(s): AMMONIA in the last 168 hours. CBC:  Recent Labs Lab 05/15/15 0001 05/16/15 1711 05/17/15 0150 05/18/15 0400 05/19/15 1011 05/20/15 0942  WBC 30.2* 18.4* 17.3* 27.4* 35.8* 28.1*  NEUTROABS 28.7* 17.8* 16.8*  --   --   --   HGB 12.8 13.4 11.0* 11.0* 12.8 14.1  HCT 38.3 39.0 32.2* 32.9* 38.2 42.3  MCV 90.8 91.1 91.5 91.6 91.4 91.6  PLT 183 84* 66* 104* 134* 143*   Cardiac Enzymes: No results for input(s): CKTOTAL, CKMB,  CKMBINDEX, TROPONINI in the last 168 hours. BNP: Invalid input(s): POCBNP CBG: No results for input(s): GLUCAP in the last 168 hours.  Blood Culture (routine x 2)     Status: None   Collection Time: 05/16/15  5:02 PM  Result Value Ref Range Status   Specimen Description BLOOD LEFT HAND  Final   Special Requests IN PEDIATRIC BOTTLE 2CC  Final   Culture  Setup Time   Final   Culture   Final    ESCHERICHIA COLI Performed at [facility]    Report Status 05/19/2015 FINAL  Final   Organism ID, Bacteria ESCHERICHIA COLI  Final      Susceptibility   Escherichia coli - MIC*    AMPICILLIN >=32 RESISTANT Resistant     CEFAZOLIN <=4 SENSITIVE Sensitive     CEFEPIME <=1 SENSITIVE Sensitive     CEFTAZIDIME <=1 SENSITIVE Sensitive     CEFTRIAXONE <=1 SENSITIVE Sensitive     CIPROFLOXACIN <=0.25 SENSITIVE Sensitive     GENTAMICIN <=1 SENSITIVE Sensitive     IMIPENEM <=0.25 SENSITIVE Sensitive     TRIMETH/SULFA >=320 RESISTANT Resistant      AMPICILLIN/SULBACTAM 16 INTERMEDIATE Intermediate     PIP/TAZO <=4 SENSITIVE Sensitive     * ESCHERICHIA COLI  Urine culture     Status: None   Collection Time: 05/16/15  5:28 PM  Result Value Ref Range Status   Specimen Description URINE, CLEAN CATCH  Final   Special Requests NONE  Final   Culture   Final   Report Status 05/18/2015 FINAL  Final   Organism ID, Bacteria ESCHERICHIA COLI  Final      Susceptibility   Escherichia coli - MIC*    AMPICILLIN >=32 RESISTANT Resistant     CEFAZOLIN <=4 SENSITIVE Sensitive     CEFTRIAXONE <=1 SENSITIVE Sensitive     CIPROFLOXACIN <=0.25 SENSITIVE Sensitive     GENTAMICIN <=1 SENSITIVE Sensitive     IMIPENEM <=0.25 SENSITIVE Sensitive     NITROFURANTOIN <=16 SENSITIVE Sensitive     TRIMETH/SULFA >=320 RESISTANT Resistant     AMPICILLIN/SULBACTAM 16 INTERMEDIATE Intermediate     PIP/TAZO <=4 SENSITIVE Sensitive     * >=100,000 COLONIES/mL ESCHERICHIA COLI  Blood Culture (routine x 2)     Status: None (Preliminary result)   Collection Time: 05/16/15  6:58 PM  Result Value Ref Range Status   Specimen Description BLOOD LEFT WRIST  Final   Special Requests IN PEDIATRIC BOTTLE 3CC  Final   Culture   Final    NO GROWTH 2 DAYS Performed at [facility]    Report Status PENDING  Incomplete  MRSA PCR Screening     Status: None   Collection Time: 05/17/15  1:35 AM  Result Value Ref Range Status   MRSA by PCR NEGATIVE NEGATIVE Final  Urine culture     Status: None   Collection Time: 05/17/15 12:04 PM  Result Value Ref Range Status   Specimen Description URINE, CATHETERIZED  Final   Special Requests NONE  Final   Culture   Final    NO GROWTH 1 DAY Performed at [facility]    Report Status 05/18/2015 FINAL  Final     Scheduled Meds: .  ceFAZolin (ANCEF) IV  2 g Intravenous Q8H  . docusate sodium  100 mg Oral BID  . senna  1 tablet Oral BID  . sodium chloride  3 mL Intravenous Q12H   Continuous Infusions: . sodium  chloride Stopped (05/19/15  2200)         

## 2015-05-21 ENCOUNTER — Inpatient Hospital Stay (HOSPITAL_COMMUNITY): Payer: Medicare Other

## 2015-05-21 LAB — CBC
HEMATOCRIT: 37.9 % (ref 36.0–46.0)
HEMOGLOBIN: 12.7 g/dL (ref 12.0–15.0)
MCH: 30.5 pg (ref 26.0–34.0)
MCHC: 33.5 g/dL (ref 30.0–36.0)
MCV: 91.1 fL (ref 78.0–100.0)
Platelets: 138 10*3/uL — ABNORMAL LOW (ref 150–400)
RBC: 4.16 MIL/uL (ref 3.87–5.11)
RDW: 13.2 % (ref 11.5–15.5)
WBC: 24.9 10*3/uL — ABNORMAL HIGH (ref 4.0–10.5)

## 2015-05-21 LAB — CULTURE, BLOOD (ROUTINE X 2): Culture: NO GROWTH

## 2015-05-21 NOTE — Progress Notes (Signed)
[facility] PROGRESS NOTE  Alison Zimmerman:[id number] DOB: 03-Jan-1951 DOA: 05/16/2015 PCP: [name], MD  Brief narrative:    64 y.o. Female, a Jehovah's witness (agreeable to FFP and platelets transfusion), history of breast cancer (used to follow with [name]) using alternative medicine for treatment such as juicing. Pt presented to PCP office 05/15/2015 with urinary frequency, urgency and right flank pain for a week prior tot his admission. Her symptoms were associated with vomiting. She was prescribed septra and sent home. She came back the following day for recheck and her blood work ultimately came back with WBC count of 30 so she presented to [facility] ED for further evaluation.  On admission, she was hypotensive, tachycardic, tachypneic, afebrile. He WBC count was 18.4, platelets 84 -->66, hemoglobin 11. Creatinine was 3.32, lactic acid 9.68, procalcitonin level 62.97. Sodium was 127 and potassium 3.0. CXR showed no acute cardiopulmonary process.  CT renal stone study w/o contrast demonstrated 6 x 12 mm stone and 5 mm stone in the mid right ureter at the level of the sacrum with severe proximal obstructive change and infiltration around the right kidney and ureter may be due to obstruction or infection. GU as then consulted for attempt to place the stent. She underwent cystoscopy with right ureteral stent placement 05/17/2015. Post procedure she was transferred to SDU.  Transferred to telemetry floor 05/18/2015.  Hospital course complicated with finding of Escherichia coli UTI and bacteremia.  Assessment/Plan:    Principal Problem: Severe sepsis due to Escherichia coli with acute renal failure (HCC) / Pyelonephritis / Escherichia coli UTI / Escherichia coli bacteremia / Leukocytosis  - Please note severe sepsis criteria met on admission with hypotension, tachycardia, tachypnea, elevated lactic acid at 9.68 and elevated pro calcitonin level at 62.97 - Source of infection  - E coli bacteremia and urinary tract infection. - Repeat blood cultures showed no growth - Leukocytosis is improving. - Continue ancef  - Patient was initially on Zosyn but we changed that to Ancef per sensitivity report on 05/19/2015   Active Problems: Severe hydroureteronephrosis, right  - S/P right ureteral stent placement 05/17/2015 - Patient will follow up with the GU on outpatient basis  Acute renal failure - Likely from combination of septra, sepsis, severe hydroureteronephrosis  - Cr as high as 3.32 on admission but after stent placement and with hydration her Cr improved to 1.13 - Check BMP in am  Ductal carcinoma in situ (DCIS) of right breast - Patient on alternative treatments at home  Anemia of chronic disease / Jehovah's witness  - Due to history of malignancy. - Hemoglobin stable  Thrombocytopenia - Likely combination of septra and sepsis as well as history of malignancy - Platelets stable - Pt agreeable for FFP and platelet transfusion if needed   Hyponatremia - Due to prerenal etiology, sepsis - Sodium better with hydration   Hypokalemia - Likely due to sepsis - Supplemented  - Check BMP in am  Migraine headaches - Order placed for MRI brain, pt with also questionable mental status changes, keep asking about GU procedures even though i have repeatedly mentioned [facility] has already placed stent and anything else will be done outpt, RN also reports possible acute confusion    DVT Prophylaxis  - SCD's bilaterally while pt in hospital    Code Status: Full.  Family Communication:  plan of care discussed with the patient; called her husband as well.  Disposition Plan: Home 12/23  IV access:  Peripheral IV  Procedures and  diagnostic studies:    1. Cystourethroscopy 2. Right retrograde pyelogram 3. Right ureteral stent placement, 64F x 24 cm JJ ureteral stent without string 05/17/2015  Dg Chest Portable 1 View 05/16/2015 No acute cardiopulmonary disease.  Electronically Signed   By: [name] M.D.   On: 05/16/2015 18:01   Ct Renal Stone Study 05/16/2015   6 x 12 mm stone and 5 mm stone in the mid right ureter at the level of the sacrum with severe proximal obstructive change. Infiltration around the right kidney and ureter may be due to obstruction or infection. Electronically Signed   By: [name] M.D.   On: 05/16/2015 22:56   US Abdomen Limited Ruq 05/17/2015  1. Diffuse gallbladder wall thickening. No stones. Negative sonographic Murphy's sign. 2. Right pleural effusion. Electronically Signed   By: [name] M.D.   On: 05/17/2015 18:15    Medical Consultants:  GU CCM  Other Consultants:  None   IAnti-Infectives:   Zosyn 05/16/2015 --> 05/19/2015 Ancef 05/19/2015 -->   [name], MD  [facility] Pager [phone]  Time spent in minutes: 25 minutes  If 7PM-7AM, please contact night-coverage [email or web address] Password [facility] 05/21/2015, 3:11 PM   LOS: 5 days    HPI/Subjective: No acute overnight events. No vomiting.   Objective: Filed Vitals:   05/20/15 1303 05/20/15 2150 05/21/15 0644 05/21/15 1404  BP: 139/82 121/71 155/70 132/75  Pulse: 78 77 98 105  Temp: 97.9 F (36.6 C) 97.7 F (36.5 C) 98.3 F (36.8 C) 98.9 F (37.2 C)  TempSrc: Oral Oral Oral Oral  Resp: '20 18 20 18  ' Height:      Weight:      SpO2: 100% 100% 100% 100%    Intake/Output Summary (Last 24 hours) at 05/21/15 1511 Last data filed at 05/21/15 0644  Gross per 24 hour  Intake    255 ml  Output    600 ml  Net   -345 ml    Exam:   General:  Pt is no acute distress  Cardiovascular: RRR, appreciate S1, S2  Respiratory: no wheezing, no rhonchi   Abdomen: (+) BS, non tender abd  Extremities: palpable pulses, trace B/L LE swelling   Neuro: No focal deficits   Data Reviewed: Basic Metabolic Panel:  Recent Labs Lab 05/15/15 0001 05/16/15 1711 05/17/15 0150 05/18/15 0400  NA 136 127* 138 145  K 3.9 3.0* 4.4  4.1  CL 100 90* 111 119*  CO2 23 18* 15* 17*  GLUCOSE 132* 118* 103* 103*  BUN 21 38* 27* 18  CREATININE 1.24* 3.32* 2.04* 1.13*  CALCIUM 9.0 8.1* 7.1* 7.8*  MG  --   --  1.5* 2.1  PHOS  --   --  2.7 2.0*   Liver Function Tests:  Recent Labs Lab 05/15/15 0001 05/16/15 1711 05/17/15 0150 05/18/15 0400  AST 26 67* 92* 155*  ALT 20 40 71* 210*  ALKPHOS 71 240* 99 126  BILITOT 0.9 1.3* 1.1 0.7  PROT 6.9 7.4 5.8* 5.5*  ALBUMIN 4.1 3.9 2.8* 2.6*   No results for input(s): LIPASE, AMYLASE in the last 168 hours. No results for input(s): AMMONIA in the last 168 hours. CBC:  Recent Labs Lab 05/15/15 0001 05/16/15 1711 05/17/15 0150 05/18/15 0400 05/19/15 1011 05/20/15 0942 05/21/15 0815  WBC 30.2* 18.4* 17.3* 27.4* 35.8* 28.1* 24.9*  NEUTROABS 28.7* 17.8* 16.8*  --   --   --   --   HGB 12.8 13.4 11.0* 11.0* 12.8  14.1 12.7  HCT 38.3 39.0 32.2* 32.9* 38.2 42.3 37.9  MCV 90.8 91.1 91.5 91.6 91.4 91.6 91.1  PLT 183 84* 66* 104* 134* 143* 138*   Cardiac Enzymes: No results for input(s): CKTOTAL, CKMB, CKMBINDEX, TROPONINI in the last 168 hours. BNP: Invalid input(s): POCBNP CBG: No results for input(s): GLUCAP in the last 168 hours.  Blood Culture (routine x 2)     Status: None   Collection Time: 05/16/15  5:02 PM  Result Value Ref Range Status   Specimen Description BLOOD LEFT HAND  Final   Special Requests IN PEDIATRIC BOTTLE 2CC  Final   Culture  Setup Time   Final   Culture   Final    ESCHERICHIA COLI Performed at [facility]    Report Status 05/19/2015 FINAL  Final   Organism ID, Bacteria ESCHERICHIA COLI  Final      Susceptibility   Escherichia coli - MIC*    AMPICILLIN >=32 RESISTANT Resistant     CEFAZOLIN <=4 SENSITIVE Sensitive     CEFEPIME <=1 SENSITIVE Sensitive     CEFTAZIDIME <=1 SENSITIVE Sensitive     CEFTRIAXONE <=1 SENSITIVE Sensitive     CIPROFLOXACIN <=0.25 SENSITIVE Sensitive     GENTAMICIN <=1 SENSITIVE Sensitive     IMIPENEM  <=0.25 SENSITIVE Sensitive     TRIMETH/SULFA >=320 RESISTANT Resistant     AMPICILLIN/SULBACTAM 16 INTERMEDIATE Intermediate     PIP/TAZO <=4 SENSITIVE Sensitive     * ESCHERICHIA COLI  Urine culture     Status: None   Collection Time: 05/16/15  5:28 PM  Result Value Ref Range Status   Specimen Description URINE, CLEAN CATCH  Final   Special Requests NONE  Final   Culture   Final   Report Status 05/18/2015 FINAL  Final   Organism ID, Bacteria ESCHERICHIA COLI  Final      Susceptibility   Escherichia coli - MIC*    AMPICILLIN >=32 RESISTANT Resistant     CEFAZOLIN <=4 SENSITIVE Sensitive     CEFTRIAXONE <=1 SENSITIVE Sensitive     CIPROFLOXACIN <=0.25 SENSITIVE Sensitive     GENTAMICIN <=1 SENSITIVE Sensitive     IMIPENEM <=0.25 SENSITIVE Sensitive     NITROFURANTOIN <=16 SENSITIVE Sensitive     TRIMETH/SULFA >=320 RESISTANT Resistant     AMPICILLIN/SULBACTAM 16 INTERMEDIATE Intermediate     PIP/TAZO <=4 SENSITIVE Sensitive     * >=100,000 COLONIES/mL ESCHERICHIA COLI  Blood Culture (routine x 2)     Status: None (Preliminary result)   Collection Time: 05/16/15  6:58 PM  Result Value Ref Range Status   Specimen Description BLOOD LEFT WRIST  Final   Special Requests IN PEDIATRIC BOTTLE 3CC  Final   Culture   Final    NO GROWTH 2 DAYS Performed at [facility]    Report Status PENDING  Incomplete  MRSA PCR Screening     Status: None   Collection Time: 05/17/15  1:35 AM  Result Value Ref Range Status   MRSA by PCR NEGATIVE NEGATIVE Final  Urine culture     Status: None   Collection Time: 05/17/15 12:04 PM  Result Value Ref Range Status   Specimen Description URINE, CATHETERIZED  Final   Special Requests NONE  Final   Culture   Final    NO GROWTH 1 DAY Performed at [facility]    Report Status 05/18/2015 FINAL  Final     Scheduled Meds: .  ceFAZolin (ANCEF) IV  2  g Intravenous Q8H  . docusate sodium  100 mg Oral BID  . senna  1 tablet Oral BID  .  sodium chloride  3 mL Intravenous Q12H   Continuous Infusions: . sodium chloride Stopped (05/19/15 2200)

## 2015-05-21 NOTE — Care Management Important Message (Signed)
Important Message  Patient Details  Name: GERIYAH GACCIONE MRN: [id number] Date of Birth: [birth year]   Medicare Important Message Given:  Yes    Camillo Flaming 05/21/2015, [place] Message  Patient Details  Name: SHASTA BARBAREE MRN: [id number] Date of Birth: Oct 07, 1950   Medicare Important Message Given:  Yes    Camillo Flaming 05/21/2015, 11:22 AM

## 2015-05-22 LAB — BASIC METABOLIC PANEL
ANION GAP: 9 (ref 5–15)
BUN: 13 mg/dL (ref 6–20)
CALCIUM: 8.1 mg/dL — AB (ref 8.9–10.3)
CO2: 25 mmol/L (ref 22–32)
Chloride: 108 mmol/L (ref 101–111)
Creatinine, Ser: 0.74 mg/dL (ref 0.44–1.00)
GFR calc non Af Amer: >60 mL/min (ref >60)
Glucose, Bld: 79 mg/dL (ref 65–99)
POTASSIUM: 3.4 mmol/L — AB (ref 3.5–5.1)
Sodium: 142 mmol/L (ref 135–145)

## 2015-05-22 LAB — CBC
HEMATOCRIT: 34.6 % — AB (ref 36.0–46.0)
HEMOGLOBIN: 11.6 g/dL — AB (ref 12.0–15.0)
MCH: 30.3 pg (ref 26.0–34.0)
MCHC: 33.5 g/dL (ref 30.0–36.0)
MCV: 90.3 fL (ref 78.0–100.0)
Platelets: 157 10*3/uL (ref 150–400)
RBC: 3.83 MIL/uL — AB (ref 3.87–5.11)
RDW: 13 % (ref 11.5–15.5)
WBC: 20.5 10*3/uL — AB (ref 4.0–10.5)

## 2015-05-22 MED ORDER — ACYCLOVIR 5 % EX OINT
TOPICAL_OINTMENT | Freq: Three times a day (TID) | CUTANEOUS | Status: DC
  Administered 2015-05-22 – 2015-05-24 (×6): via TOPICAL
  Filled 2015-05-22: qty 15

## 2015-05-22 NOTE — Progress Notes (Signed)
Pt is concerned this am about the planned discharge for tom Fri. She said husband wont be available to pick her up til Sat & nobody else can pick her up if she gets discharge tom.

## 2015-05-22 NOTE — Progress Notes (Addendum)
[facility] PROGRESS NOTE  TEAGHAN MELROSE PZW:[id number] DOB: 1951-01-03 DOA: 05/16/2015 PCP: [name], MD  Brief narrative:    64 y.o. Female, a Jehovah's witness (agreeable to FFP and platelets transfusion), history of breast cancer (used to follow with [name]) using alternative medicine for treatment such as juicing. Pt presented to PCP office 05/15/2015 with urinary frequency, urgency and right flank pain for a week prior tot his admission. Her symptoms were associated with vomiting. She was prescribed septra and sent home. She came back the following day for recheck and her blood work ultimately came back with WBC count of 30 so she presented to [facility] ED for further evaluation.  On admission, she was hypotensive, tachycardic, tachypneic, afebrile. He WBC count was 18.4, platelets 84 -->66, hemoglobin 11. Creatinine was 3.32, lactic acid 9.68, procalcitonin level 62.97. Sodium was 127 and potassium 3.0. CXR showed no acute cardiopulmonary process.  CT renal stone study w/o contrast demonstrated 6 x 12 mm stone and 5 mm stone in the mid right ureter at the level of the sacrum with severe proximal obstructive change and infiltration around the right kidney and ureter may be due to obstruction or infection. GU as then consulted for attempt to place the stent. She underwent cystoscopy with right ureteral stent placement 05/17/2015. Post procedure she was transferred to SDU.  Transferred to telemetry floor 05/18/2015.  Hospital course complicated with finding of Escherichia coli UTI and bacteremia.  Assessment/Plan:    Principal Problem: Severe sepsis due to Escherichia coli with acute renal failure (HCC) / Pyelonephritis / Escherichia coli UTI / Escherichia coli bacteremia / Leukocytosis  - Please note severe sepsis criteria met on admission with hypotension, tachycardia, tachypnea, elevated lactic acid at 9.68 and elevated procalcitonin level at 62.97 - Source of infection  - E coli bacteremia and urinary tract infection. - Repeat blood cultures negative - Leukocytosis is improving. - We will continue ancef  - Patient was initially on Zosyn but we changed that to Ancef per sensitivity report on 05/19/2015   Active Problems: Severe hydroureteronephrosis, right  - S/P right ureteral stent placement 05/17/2015 - Patient will follow up with the GU on outpatient basis  Acute renal failure - Likely from combination of septra, sepsis, severe hydroureteronephrosis  - Cr as high as 3.32 on admission but after stent placement and with hydration Cr normalized   Ductal carcinoma in situ (DCIS) of right breast - Patient on alternative treatments at home  Anemia of chronic disease / Jehovah's witness  - Due to history of malignancy. - Stable  Thrombocytopenia - Likely combination of septra and sepsis as well as history of malignancy - Platelets stable, normalized  - Pt agreeable for FFP and platelet transfusion if needed   Hyponatremia - Due to prerenal etiology, sepsis - WNL with hydration   Hypokalemia - Likely due to sepsis - Supplemented   Migraine headaches - MRI with ?MS, migraine headache - Outpt neurology f/u   DVT Prophylaxis  - SCD's bilaterally  Code Status: Full.  Family Communication:  plan of care discussed with the patient; called her husband as well.  Disposition Plan: Home 12/24  IV access:  Peripheral IV  Procedures and diagnostic studies:    1. Cystourethroscopy 2. Right retrograde pyelogram 3. Right ureteral stent placement, 83F x 24 cm JJ ureteral stent without string 05/17/2015  Dg Chest Portable 1 View 05/16/2015 No acute cardiopulmonary disease. Electronically Signed   By: [name] M.D.   On: 05/16/2015 18:01  Ct Renal Stone Study 05/16/2015   6 x 12 mm stone and 5 mm stone in the mid right ureter at the level of the sacrum with severe proximal obstructive change. Infiltration around the right kidney and ureter may  be due to obstruction or infection. Electronically Signed   By: [name] M.D.   On: 05/16/2015 22:56   US Abdomen Limited Ruq 05/17/2015  1. Diffuse gallbladder wall thickening. No stones. Negative sonographic Murphy's sign. 2. Right pleural effusion. Electronically Signed   By: [name] M.D.   On: 05/17/2015 18:15    Medical Consultants:  GU CCM  Other Consultants:  None   IAnti-Infectives:   Zosyn 05/16/2015 --> 05/19/2015 Ancef 05/19/2015 -->   [name], MD  [facility] Pager [phone]  Time spent in minutes: 15 minutes  If 7PM-7AM, please contact night-coverage [email or web address] Password [facility] 05/22/2015, 11:55 AM   LOS: 6 days    HPI/Subjective: No acute overnight events.    Objective: Filed Vitals:   05/21/15 0644 05/21/15 1404 05/21/15 2131 05/22/15 0620  BP: 155/70 132/75 130/67 125/68  Pulse: 98 105 92 83  Temp: 98.3 F (36.8 C) 98.9 F (37.2 C) 98.2 F (36.8 C) 98 F (36.7 C)  TempSrc: Oral Oral Oral Oral  Resp: _0 Height:      Weight:      SpO2: 100% 100% 99% 100%    Intake/Output Summary (Last 24 hours) at 05/22/15 1155 Last data filed at 05/22/15 1000  Gross per 24 hour  Intake 691.67 ml  Output      0 ml  Net 691.67 ml    Exam:   General:  Pt is no acute distress  Respiratory: bilateral air entry, no wheezing   Abdomen: non tender abd, (+ )BS  Extremities: no edema, palpable pulses   Neuro: Nonfocal  Data Reviewed: Basic Metabolic Panel:  Recent Labs Lab 05/16/15 1711 05/17/15 0150 05/18/15 0400 05/22/15 0430  NA 127* 138 145 142  K 3.0* 4.4 4.1 3.4*  CL 90* 111 119* 108  CO2 18* 15* 17* 25  GLUCOSE 118* 103* 103* 79  BUN 38* 27* 18 13  CREATININE 3.32* 2.04* 1.13* 0.74  CALCIUM 8.1* 7.1* 7.8* 8.1*  MG  --  1.5* 2.1  --   PHOS  --  2.7 2.0*  --    Liver Function Tests:  Recent Labs Lab 05/16/15 1711 05/17/15 0150 05/18/15 0400  AST 67* 92* 155*  ALT 40 71* 210*  ALKPHOS 240*  99 126  BILITOT 1.3* 1.1 0.7  PROT 7.4 5.8* 5.5*  ALBUMIN 3.9 2.8* 2.6*   No results for input(s): LIPASE, AMYLASE in the last 168 hours. No results for input(s): AMMONIA in the last 168 hours. CBC:  Recent Labs Lab 05/16/15 1711 05/17/15 0150 05/18/15 0400 05/19/15 1011 05/20/15 0942 05/21/15 0815 05/22/15 0430  WBC 18.4* 17.3* 27.4* 35.8* 28.1* 24.9* 20.5*  NEUTROABS 17.8* 16.8*  --   --   --   --   --   HGB 13.4 11.0* 11.0* 12.8 14.1 12.7 11.6*  HCT 39.0 32.2* 32.9* 38.2 42.3 37.9 34.6*  MCV 91.1 91.5 91.6 91.4 91.6 91.1 90.3  PLT 84* 66* 104* 134* 143* 138* 157   Cardiac Enzymes: No results for input(s): CKTOTAL, CKMB, CKMBINDEX, TROPONINI in the last 168 hours. BNP: Invalid input(s): POCBNP CBG: No results for input(s): GLUCAP in the last 168 hours.  Blood Culture (routine x 2)     Status: None  Collection Time: 05/16/15  5:02 PM  Result Value Ref Range Status   Specimen Description BLOOD LEFT HAND  Final   Special Requests IN PEDIATRIC BOTTLE 2CC  Final   Culture  Setup Time   Final   Culture   Final    ESCHERICHIA COLI Performed at [facility]    Report Status 05/19/2015 FINAL  Final   Organism ID, Bacteria ESCHERICHIA COLI  Final      Susceptibility   Escherichia coli - MIC*    AMPICILLIN >=32 RESISTANT Resistant     CEFAZOLIN <=4 SENSITIVE Sensitive     CEFEPIME <=1 SENSITIVE Sensitive     CEFTAZIDIME <=1 SENSITIVE Sensitive     CEFTRIAXONE <=1 SENSITIVE Sensitive     CIPROFLOXACIN <=0.25 SENSITIVE Sensitive     GENTAMICIN <=1 SENSITIVE Sensitive     IMIPENEM <=0.25 SENSITIVE Sensitive     TRIMETH/SULFA >=320 RESISTANT Resistant     AMPICILLIN/SULBACTAM 16 INTERMEDIATE Intermediate     PIP/TAZO <=4 SENSITIVE Sensitive     * ESCHERICHIA COLI  Urine culture     Status: None   Collection Time: 05/16/15  5:28 PM  Result Value Ref Range Status   Specimen Description URINE, CLEAN CATCH  Final   Special Requests NONE  Final   Culture   Final    Report Status 05/18/2015 FINAL  Final   Organism ID, Bacteria ESCHERICHIA COLI  Final      Susceptibility   Escherichia coli - MIC*    AMPICILLIN >=32 RESISTANT Resistant     CEFAZOLIN <=4 SENSITIVE Sensitive     CEFTRIAXONE <=1 SENSITIVE Sensitive     CIPROFLOXACIN <=0.25 SENSITIVE Sensitive     GENTAMICIN <=1 SENSITIVE Sensitive     IMIPENEM <=0.25 SENSITIVE Sensitive     NITROFURANTOIN <=16 SENSITIVE Sensitive     TRIMETH/SULFA >=320 RESISTANT Resistant     AMPICILLIN/SULBACTAM 16 INTERMEDIATE Intermediate     PIP/TAZO <=4 SENSITIVE Sensitive     * >=100,000 COLONIES/mL ESCHERICHIA COLI  Blood Culture (routine x 2)     Status: None (Preliminary result)   Collection Time: 05/16/15  6:58 PM  Result Value Ref Range Status   Specimen Description BLOOD LEFT WRIST  Final   Special Requests IN PEDIATRIC BOTTLE 3CC  Final   Culture   Final    NO GROWTH 2 DAYS Performed at [facility]    Report Status PENDING  Incomplete  MRSA PCR Screening     Status: None   Collection Time: 05/17/15  1:35 AM  Result Value Ref Range Status   MRSA by PCR NEGATIVE NEGATIVE Final  Urine culture     Status: None   Collection Time: 05/17/15 12:04 PM  Result Value Ref Range Status   Specimen Description URINE, CATHETERIZED  Final   Special Requests NONE  Final   Culture   Final    NO GROWTH 1 DAY Performed at [facility]    Report Status 05/18/2015 FINAL  Final     Scheduled Meds: .  ceFAZolin (ANCEF) IV  2 g Intravenous Q8H  . docusate sodium  100 mg Oral BID  . senna  1 tablet Oral BID  . sodium chloride  3 mL Intravenous Q12H   Continuous Infusions: . sodium chloride 50 mL/hr at 05/22/15 0022

## 2015-05-23 LAB — CBC
HCT: 31.3 % — ABNORMAL LOW (ref 36.0–46.0)
Hemoglobin: 10.5 g/dL — ABNORMAL LOW (ref 12.0–15.0)
MCH: 30.7 pg (ref 26.0–34.0)
MCHC: 33.5 g/dL (ref 30.0–36.0)
MCV: 91.5 fL (ref 78.0–100.0)
Platelets: 186 10*3/uL (ref 150–400)
RBC: 3.42 MIL/uL — ABNORMAL LOW (ref 3.87–5.11)
RDW: 13.2 % (ref 11.5–15.5)
WBC: 13.3 10*3/uL — ABNORMAL HIGH (ref 4.0–10.5)

## 2015-05-23 LAB — CREATININE, SERUM
CREATININE: 0.74 mg/dL (ref 0.44–1.00)
GFR calc non Af Amer: >60 mL/min (ref >60)

## 2015-05-23 MED ORDER — CIPROFLOXACIN HCL 500 MG PO TABS: 500.0000 mg | ORAL_TABLET | Freq: Two times a day (BID) | ORAL | Status: DC

## 2015-05-23 MED ORDER — CIPROFLOXACIN HCL 500 MG PO TABS
500.0000 mg | ORAL_TABLET | Freq: Once | ORAL | Status: AC
  Administered 2015-05-23: 500 mg via ORAL
  Filled 2015-05-23: qty 1

## 2015-05-23 NOTE — Discharge Summary (Addendum)
Physician Discharge Summary  Alison Zimmerman QMV:[id number] DOB: 10-Sep-1950 DOA: 05/16/2015  PCP: [name], MD  Admit date: 05/16/2015 Discharge date: 05/24/2015  Recommendations for Outpatient Follow-up:  1. Cipro for 7 days on discharge   Discharge Diagnoses:  Principal Problem:   Sepsis due to Escherichia coli with acute renal failure (HCC) Active Problems:   Pyelonephritis   Hydroureteronephrosis   Leukocytosis   Bacteremia due to Escherichia coli   Ductal carcinoma in situ (DCIS) of right breast   Acute renal failure (ARF) (HCC)   Hyponatremia   Hypokalemia   Anemia of chronic disease   Thrombocytopenia (HCC)    Discharge Condition: stable   Diet recommendation: as tolerated   History of present illness:   64 y.o. Female, a Jehovah's witness (agreeable to FFP and platelets transfusion), history of breast cancer (used to follow with [name]) using alternative medicine for treatment such as juicing. Pt presented to PCP office 05/15/2015 with urinary frequency, urgency and right flank pain for a week prior tot his admission. Her symptoms were associated with vomiting. She was prescribed septra and sent home. She came back the following day for recheck and her blood work ultimately came back with WBC count of 30 so she presented to [facility] ED for further evaluation.  On admission, she was hypotensive, tachycardic, tachypneic, afebrile. He WBC count was 18.4, platelets 84 -->66, hemoglobin 11. Creatinine was 3.32, lactic acid 9.68, procalcitonin level 62.97. Sodium was 127 and potassium 3.0. CXR showed no acute cardiopulmonary process. CT renal stone study w/o contrast demonstrated 6 x 12 mm stone and 5 mm stone in the mid right ureter at the level of the sacrum with severe proximal obstructive change and infiltration around the right kidney and ureter may be due to obstruction or infection. GU as then consulted for attempt to place the stent. She underwent cystoscopy  with right ureteral stent placement 05/17/2015. Post procedure she was transferred to SDU.  Transferred to telemetry floor 05/18/2015.  Hospital course complicated with finding of Escherichia coli UTI and bacteremia.  Hospital Course:    Assessment/Plan:    Principal Problem: Severe sepsis due to Escherichia coli with acute renal failure (HCC) / Pyelonephritis / Escherichia coli UTI / Escherichia coli bacteremia / Leukocytosis  - Severe sepsis criteria met on admission with hypotension, tachycardia, tachypnea, elevated lactic acid at 9.68 and elevated procalcitonin level at 62.97 - Source of infection - E coli bacteremia and urinary tract infection. Blood cultures 12/16 x 1 set with E.Coli. - Repeat blood cultures 05/19/2015  negative - Leukocytosis is much improved, 13 this am - Continue ancef through 12/14 and then cipro for 7 more days on discharge  - Patient was initially on Zosyn but we changed that to Ancef per sensitivity report on 05/19/2015   Active Problems: Severe hydroureteronephrosis, right  - S/P right ureteral stent placement 05/17/2015 - Patient will follow up with the GU on outpatient basis  Acute renal failure - Likely from combination of septra, sepsis, severe hydroureteronephrosis  - Cr as high as 3.32 on admission but after stent placement and with hydration Cr normalized   Ductal carcinoma in situ (DCIS) of right breast - Patient on alternative treatments   Anemia of chronic disease / Jehovah's witness  - Due to history of malignancy.  Thrombocytopenia - Likely combination of septra and sepsis as well as history of malignancy - Platelets normalized  - Pt agreeable for FFP and platelet transfusion if needed   Hyponatremia - Due to  prerenal etiology, sepsis - WNL with hydration   Hypokalemia - Likely due to sepsis - Supplemented and now WNL  Migraine headaches - MRI with ?MS, migraine headache - Outpt neurology f/u  DVT Prophylaxis  -  SCD's bilaterally  Code Status: Full.  Family Communication: plan of care discussed with the patient; called her husband as well.    IV access:  Peripheral IV  Procedures and diagnostic studies:   1. Cystourethroscopy 2. Right retrograde pyelogram 3. Right ureteral stent placement, 44F x 24 cm JJ ureteral stent without string 05/17/2015  Dg Chest Portable 1 View 05/16/2015 No acute cardiopulmonary disease. Electronically Signed By: [name] M.D. On: 05/16/2015 18:01   Ct Renal Stone Study 05/16/2015 6 x 12 mm stone and 5 mm stone in the mid right ureter at the level of the sacrum with severe proximal obstructive change. Infiltration around the right kidney and ureter may be due to obstruction or infection. Electronically Signed By: [name] M.D. On: 05/16/2015 22:56   US Abdomen Limited Ruq 05/17/2015 1. Diffuse gallbladder wall thickening. No stones. Negative sonographic Murphy's sign. 2. Right pleural effusion. Electronically Signed By: [name] M.D. On: 05/17/2015 18:15    Medical Consultants:  GU CCM  Other Consultants:  None   IAnti-Infectives:   Zosyn 05/16/2015 --> 05/19/2015 Ancef 05/19/2015 --> 05/24/2015  Signed:  [name], MD  [facility] 05/23/2015, 5:04 PM  Pager #: [phone]  Time spent in minutes: more than 30 minutes   Discharge Exam: Filed Vitals:   05/23/15 0716 05/23/15 1407  BP: 153/86 146/74  Pulse: 91 114  Temp: 98.1 F (36.7 C) 98.1 F (36.7 C)  Resp: 18 18   Filed Vitals:   05/22/15 1500 05/22/15 2132 05/23/15 0716 05/23/15 1407  BP: 136/69 137/79 153/86 146/74  Pulse: 111 97 91 114  Temp: 98.1 F (36.7 C) 98.5 F (36.9 C) 98.1 F (36.7 C) 98.1 F (36.7 C)  TempSrc: Oral Oral Oral Oral  Resp: '18 18 18 18  ' Height:      Weight:      SpO2: 100% 100% 100% 100%    General: Pt is alert, follows commands appropriately, not in acute distress Cardiovascular: Regular rate  and rhythm, S1/S2 +, no murmurs Respiratory: Clear to auscultation bilaterally, no wheezing, no crackles, no rhonchi Abdominal: Soft, non tender, non distended, bowel sounds +, no guarding Extremities: no edema, no cyanosis, pulses palpable bilaterally DP and PT Neuro: Grossly nonfocal  Discharge Instructions  Discharge Instructions    Call MD for:  difficulty breathing, headache or visual disturbances    Complete by:  As directed      Call MD for:  persistant dizziness or light-headedness    Complete by:  As directed      Call MD for:  persistant nausea and vomiting    Complete by:  As directed      Call MD for:  severe uncontrolled pain    Complete by:  As directed      Diet - low sodium heart healthy    Complete by:  As directed      Discharge instructions    Complete by:  As directed   Take Cipro for 7 days on discharge     Increase activity slowly    Complete by:  As directed             Medication List    STOP taking these medications        sulfamethoxazole-trimethoprim 800-160 MG tablet  Commonly known as:  BACTRIM DS,SEPTRA DS      TAKE these medications        ciprofloxacin 500 MG tablet  Commonly known as:  CIPRO  Take 1 tablet (500 mg total) by mouth 2 (two) times daily.     ondansetron 4 MG tablet  Commonly known as:  ZOFRAN  Take 1 tablet (4 mg total) by mouth every 8 (eight) hours as needed for nausea or vomiting.     PERCOGESIC PO  Take 1 tablet by mouth daily. Reported on 05/16/2015           Follow-up Information    Follow up with Nickie Retort, MD In 1 week.   Specialty:  Urology   Why:  Urology - discuss stone management/schedule removal   Contact information:   [place] [address] [phone]       Follow up with [name], MD. Schedule an appointment as soon as possible for a visit in 1 week.   Specialty:  Family Medicine   Why:  Follow up appt after recent hospitalization   Contact information:    [place] [address] [phone]        The results of significant diagnostics from this hospitalization (including imaging, microbiology, ancillary and laboratory) are listed below for reference.    Significant Diagnostic Studies: Mr Brain Wo Contrast  05/21/2015  CLINICAL DATA:  Headaches.  Personal history of breast cancer. EXAM: MRI HEAD WITHOUT CONTRAST TECHNIQUE: Multiplanar, multiecho pulse sequences of the brain and surrounding structures were obtained without intravenous contrast. COMPARISON:  None available. FINDINGS: Mild periventricular white matter changes bilaterally are slightly greater than expected for age. No acute infarct, hemorrhage, or mass lesion is present. The ventricles are of normal size. No significant extraaxial fluid collection is present. The internal auditory canals are within normal limits. The brainstem and cerebellum are normal. Flow is present in the major intracranial arteries. The globes and orbits are intact. Mild mucosal thickening is present in the anterior ethmoid air cells bilaterally. There is mucosal thickening along the floor of the left greater than right maxillary sinus. The remaining paranasal sinuses and the mastoid air cells are clear. The skullbase is within normal limits. Midline sagittal images are unremarkable. IMPRESSION: 1. No acute or focal intracranial abnormality to explain the patient's headaches. 2. Mild periventricular T2 changes are slightly greater than expected for age. The finding is nonspecific but can be seen in the setting of chronic microvascular ischemia, a demyelinating process such as multiple sclerosis, vasculitis, complicated migraine headaches, or as the sequelae of a prior infectious or inflammatory process. 3. Mild mucosal thickening in the anterior ethmoid air cells bilaterally and along the floor of the maxillary sinuses, left greater than right. Electronically Signed   By: San Morelle M.D.    On: 05/21/2015 21:02   Dg Chest Portable 1 View  05/16/2015  CLINICAL DATA:  Pt was seen yesterday and diagnosed with bladder infection. Having right lower flank pain with no associated dysuria. Emesis x1 today.Pt is tachypneic and WBC reported at 30.2. Also c/o RUQ chest pain, diarrhea, and dizziness. Nonsmoker. EXAM: PORTABLE CHEST 1 VIEW COMPARISON:  07/26/2014 FINDINGS: Prior median sternotomy. Mild right hemidiaphragm elevation. Midline trachea. Borderline cardiomegaly. No pleural effusion or pneumothorax. Small calcified right lung base nodules are likely related to old granulomatous disease. IMPRESSION: No acute cardiopulmonary disease. Electronically Signed   By: [name] M.D.   On: 05/16/2015 18:01   Ct  Renal Stone Study  05/16/2015  CLINICAL DATA:  Patient was seen yesterday and diagnosed with bladder infection. Now having right lower flank pain. Vomiting today. White cell count of 30. Right upper chest pain. Diarrhea. Dizziness. EXAM: CT ABDOMEN AND PELVIS WITHOUT CONTRAST TECHNIQUE: Multidetector CT imaging of the abdomen and pelvis was performed following the standard protocol without IV contrast. COMPARISON:  None. FINDINGS: Atelectasis in the lung bases. There is an ovoid stone in the mid right ureter at the level of the sacrum measuring 6 x 12 mm. There is another smaller stone in the mid right ureter measuring 5 mm diameter. There is prominent hydronephrosis and hydroureter above the level of the stones. Stranding around the kidney and ureters is likely due to obstruction. Infection not excluded. No left renal or ureteral stones. No bladder stones or bladder wall thickening. No hydronephrosis or hydroureter on the left. The unenhanced appearance of the liver, spleen, adrenal glands, pancreas, abdominal aorta, inferior vena cava, and retroperitoneal lymph nodes is unremarkable. Small amount of fluid along the right pericolic gutter likely resulting from the urinary tract process. No free  fluid or free air in the abdomen. Stomach, small bowel, and colon are not abnormally distended. Pelvis: Uterus and ovaries are not enlarged. No free or loculated pelvic fluid collections. No pelvic mass or lymphadenopathy. Bladder wall is not thickened. No destructive bone lesions. Vertebral hemangiomas at T12. IMPRESSION: 6 x 12 mm stone and 5 mm stone in the mid right ureter at the level of the sacrum with severe proximal obstructive change. Infiltration around the right kidney and ureter may be due to obstruction or infection. Electronically Signed   By: [name] M.D.   On: 05/16/2015 22:56   US Abdomen Limited Ruq  05/17/2015  CLINICAL DATA:  Elevated LFTs EXAM: US ABDOMEN LIMITED - RIGHT UPPER QUADRANT COMPARISON:  05/16/2015 FINDINGS: Gallbladder: Diffuse gallbladder wall edema. The gallbladder wall measures up to 6.7 mm in thickness. Negative sonographic Murphy's sign. Negative for stones. Common bile duct: Diameter: 2 mm Liver: No focal liver abnormality. Normal parenchymal echogenicity. Other:  Right pleural effusion. IMPRESSION: 1. Diffuse gallbladder wall thickening. No stones. Negative sonographic Murphy's sign. 2. Right pleural effusion. Electronically Signed   By: [name] M.D.   On: 05/17/2015 18:15    Microbiology: Recent Results (from the past 240 hour(s))  Blood Culture (routine x 2)     Status: None   Collection Time: 05/16/15  5:02 PM  Result Value Ref Range Status   Specimen Description BLOOD LEFT HAND  Final   Special Requests IN PEDIATRIC BOTTLE 2CC  Final   Culture  Setup Time   Final    GRAM NEGATIVE RODS IN PEDIATRIC BOTTLE CRITICAL RESULT CALLED TO, READ BACK BY AND VERIFIED WITH: Toy Baker '@[id number]'  05/17/15 MKELLY    Culture   Final    ESCHERICHIA COLI Performed at [facility]    Report Status 05/19/2015 FINAL  Final   Organism ID, Bacteria ESCHERICHIA COLI  Final      Susceptibility   Escherichia coli - MIC*    AMPICILLIN >=32 RESISTANT  Resistant     CEFAZOLIN <=4 SENSITIVE Sensitive     CEFEPIME <=1 SENSITIVE Sensitive     CEFTAZIDIME <=1 SENSITIVE Sensitive     CEFTRIAXONE <=1 SENSITIVE Sensitive     CIPROFLOXACIN <=0.25 SENSITIVE Sensitive     GENTAMICIN <=1 SENSITIVE Sensitive     IMIPENEM <=0.25 SENSITIVE Sensitive     TRIMETH/SULFA >=320 RESISTANT Resistant  AMPICILLIN/SULBACTAM 16 INTERMEDIATE Intermediate     PIP/TAZO <=4 SENSITIVE Sensitive     * ESCHERICHIA COLI  Urine culture     Status: None   Collection Time: 05/16/15  5:28 PM  Result Value Ref Range Status   Specimen Description URINE, CLEAN CATCH  Final   Special Requests NONE  Final   Culture   Final    >=100,000 COLONIES/mL ESCHERICHIA COLI Performed at [facility]    Report Status 05/18/2015 FINAL  Final   Organism ID, Bacteria ESCHERICHIA COLI  Final      Susceptibility   Escherichia coli - MIC*    AMPICILLIN >=32 RESISTANT Resistant     CEFAZOLIN <=4 SENSITIVE Sensitive     CEFTRIAXONE <=1 SENSITIVE Sensitive     CIPROFLOXACIN <=0.25 SENSITIVE Sensitive     GENTAMICIN <=1 SENSITIVE Sensitive     IMIPENEM <=0.25 SENSITIVE Sensitive     NITROFURANTOIN <=16 SENSITIVE Sensitive     TRIMETH/SULFA >=320 RESISTANT Resistant     AMPICILLIN/SULBACTAM 16 INTERMEDIATE Intermediate     PIP/TAZO <=4 SENSITIVE Sensitive     * >=100,000 COLONIES/mL ESCHERICHIA COLI  Blood Culture (routine x 2)     Status: None   Collection Time: 05/16/15  6:58 PM  Result Value Ref Range Status   Specimen Description BLOOD LEFT WRIST  Final   Special Requests IN PEDIATRIC BOTTLE 3CC  Final   Culture   Final    NO GROWTH 5 DAYS Performed at [facility]    Report Status 05/21/2015 FINAL  Final  MRSA PCR Screening     Status: None   Collection Time: 05/17/15  1:35 AM  Result Value Ref Range Status   MRSA by PCR NEGATIVE NEGATIVE Final    Comment:        The GeneXpert MRSA Assay (FDA approved for NASAL specimens only), is one component of  a comprehensive MRSA colonization surveillance program. It is not intended to diagnose MRSA infection nor to guide or monitor treatment for MRSA infections.   Urine culture     Status: None   Collection Time: 05/17/15 12:04 PM  Result Value Ref Range Status   Specimen Description URINE, CATHETERIZED  Final   Special Requests NONE  Final   Culture   Final    NO GROWTH 1 DAY Performed at [facility]    Report Status 05/18/2015 FINAL  Final  Culture, blood (routine x 2)     Status: None (Preliminary result)   Collection Time: 05/19/15 12:35 PM  Result Value Ref Range Status   Specimen Description BLOOD RIGHT HAND  Final   Special Requests BOTTLES DRAWN AEROBIC AND ANAEROBIC [place]  Final   Culture   Final    NO GROWTH 4 DAYS Performed at [facility]    Report Status PENDING  Incomplete  Culture, blood (routine x 2)     Status: None (Preliminary result)   Collection Time: 05/19/15 12:46 PM  Result Value Ref Range Status   Specimen Description BLOOD RIGHT HAND  Final   Special Requests IN PEDIATRIC BOTTLE 3CC  Final   Culture   Final    NO GROWTH 4 DAYS Performed at [facility]    Report Status PENDING  Incomplete     Labs: Basic Metabolic Panel:  Recent Labs Lab 05/16/15 1711 05/17/15 0150 05/18/15 0400 05/22/15 0430 05/23/15 0352  NA 127* 138 145 142  --   K 3.0* 4.4 4.1 3.4*  --   CL  90* 111 119* 108  --   CO2 18* 15* 17* 25  --   GLUCOSE 118* 103* 103* 79  --   BUN 38* 27* 18 13  --   CREATININE 3.32* 2.04* 1.13* 0.74 0.74  CALCIUM 8.1* 7.1* 7.8* 8.1*  --   MG  --  1.5* 2.1  --   --   PHOS  --  2.7 2.0*  --   --    Liver Function Tests:  Recent Labs Lab 05/16/15 1711 05/17/15 0150 05/18/15 0400  AST 67* 92* 155*  ALT 40 71* 210*  ALKPHOS 240* 99 126  BILITOT 1.3* 1.1 0.7  PROT 7.4 5.8* 5.5*  ALBUMIN 3.9 2.8* 2.6*   No results for input(s): LIPASE, AMYLASE in the last 168 hours. No results for input(s): AMMONIA in the last  168 hours. CBC:  Recent Labs Lab 05/16/15 1711 05/17/15 0150  05/19/15 1011 05/20/15 0942 05/21/15 0815 05/22/15 0430 05/23/15 0352  WBC 18.4* 17.3*  < > 35.8* 28.1* 24.9* 20.5* 13.3*  NEUTROABS 17.8* 16.8*  --   --   --   --   --   --   HGB 13.4 11.0*  < > 12.8 14.1 12.7 11.6* 10.5*  HCT 39.0 32.2*  < > 38.2 42.3 37.9 34.6* 31.3*  MCV 91.1 91.5  < > 91.4 91.6 91.1 90.3 91.5  PLT 84* 66*  < > 134* 143* 138* 157 186  < > = values in this interval not displayed. Cardiac Enzymes: No results for input(s): CKTOTAL, CKMB, CKMBINDEX, TROPONINI in the last 168 hours. BNP: BNP (last 3 results) No results for input(s): BNP in the last 8760 hours.  ProBNP (last 3 results) No results for input(s): PROBNP in the last 8760 hours.  CBG: No results for input(s): GLUCAP in the last 168 hours.

## 2015-05-23 NOTE — Discharge Instructions (Signed)
Bacteremia °Bacteremia is the presence of bacteria in the blood. A small amount of bacteria may not cause any symptoms. °Sometimes, the bacteria spread and cause infection in other parts of the body, such as the heart, joints, bones, or brain. Having a great amount of bacteria can cause a serious, sometimes life-threatening infection called sepsis. °CAUSES °This condition is caused by bacteria that get into the blood. Bacteria can enter the blood: °· During a dental or medical procedure. °· After you brush your teeth so hard that the gums bleed. °· Through a scrape or cut on your skin. °More severe types of bacteremia can be caused by: °· A bacterial infection, such as pneumonia, that spreads to the blood. °· Using a dirty needle. °RISK FACTORS °This condition is more likely to develop in: °· Children and elderly adults. °· People who have a long-lasting (chronic) disease or medical condition. °· People who have an artificial joint or heart valve. °· People who have heart valve disease. °· People who have a tube, such as a catheter or IV tube, that has been inserted for a medical treatment. °· People who have a weak body defense system (immune system). °· People who use IV drugs. °SYMPTOMS °Usually, this condition does not cause symptoms when it is mild. When it is more serious, it may cause: °· Fever. °· Chills. °· Racing heart. °· Shortness of breath. °· Dizziness. °· Weakness. °· Confusion. °· Nausea or vomiting. °· Diarrhea. °Bacteremia that has spread to other parts of the body may cause symptoms in those areas. °DIAGNOSIS °This condition may be diagnosed with a physical exam and tests, such as: °· A complete blood count (CBC). This test looks for signs of infection. °· Blood cultures. These look for bacteria in your blood. °· Tests of any IV tubes. These look for a source of infection. °· Urine tests. °· Imaging tests, such as an X-ray, CT scan, MRI, or heart ultrasound. °TREATMENT °If the condition is mild,  treatment is usually not needed. Usually, the body's immune system will remove the bacteria. If the condition is more serious, it may be treated with: °· Antibiotic medicines through an IV tube. These may be given for about 2 weeks. At first, the antibiotic that is given may kill most types of blood bacteria. If your test results show that a certain kind of bacteria is causing problems, the antibiotic may be changed to kill only the bacteria that are causing problems. °· Antibiotics taken by mouth. °· Removing any catheter or IV tube that is a source of infection. °· Blood pressure and breathing support, if needed. °· Surgery to control the source or spread of infection, if needed. °HOME CARE INSTRUCTIONS °· Take over-the-counter and prescription medicines only as told by your health care provider. °· If you were prescribed an antibiotic, take it as told by your health care provider. Do not stop taking the antibiotic even if you start to feel better. °· Rest at home until your condition is under control. °· Drink enough fluid to keep your urine clear or pale yellow. °· Keep all follow-up visits as told by your health care provider. This is important. °PREVENTION °Take these actions to help prevent future episodes of bacteremia: °· Get all vaccinations as recommended by your health care provider. °· Clean and cover scrapes or cuts. °· Bathe regularly. °· Wash your hands often. °· Before any dental or surgical procedure, ask your health care provider if you should take an antibiotic. °SEEK MEDICAL   CARE IF: °· Your symptoms get worse. °· You continue to have symptoms after treatment. °· You develop new symptoms after treatment. °SEEK IMMEDIATE MEDICAL CARE IF: °· You have chest pain or trouble breathing. °· You develop confusion, dizziness, or weakness. °· You develop pale skin. °  °This information is not intended to replace advice given to you by your health care provider. Make sure you discuss any questions you have  with your health care provider. °  °Document Released: 02/28/2006 Document Revised: 02/05/2015 Document Reviewed: 07/20/2014 °Elsevier Interactive Patient Education ©2016 [employer] ° °

## 2015-05-24 LAB — CULTURE, BLOOD (ROUTINE X 2)
Culture: NO GROWTH
Culture: NO GROWTH

## 2015-05-24 NOTE — Progress Notes (Signed)
Pt seen and examined at the bedside Stable for discharge Please refer to D/C summary done  05/23/2015 No changes in medical management since 05/23/2015  Alison Zimmerman TRH [phone] 

## 2015-05-28 ENCOUNTER — Ambulatory Visit (INDEPENDENT_AMBULATORY_CARE_PROVIDER_SITE_OTHER): Payer: No Typology Code available for payment source | Admitting: Family Medicine

## 2015-05-28 ENCOUNTER — Encounter: Payer: Self-pay | Admitting: Family Medicine

## 2015-05-28 VITALS — BP 94/68 | HR 72 | Resp 12 | Wt 113.4 lb

## 2015-05-28 DIAGNOSIS — D0511 Intraductal carcinoma in situ of right breast: Secondary | ICD-10-CM

## 2015-05-28 DIAGNOSIS — B9620 Unspecified Escherichia coli [E. coli] as the cause of diseases classified elsewhere: Secondary | ICD-10-CM

## 2015-05-28 DIAGNOSIS — R7881 Bacteremia: Secondary | ICD-10-CM

## 2015-05-28 DIAGNOSIS — N1330 Unspecified hydronephrosis: Secondary | ICD-10-CM

## 2015-05-28 DIAGNOSIS — A4181 Sepsis due to Enterococcus: Secondary | ICD-10-CM | POA: Diagnosis not present

## 2015-05-28 DIAGNOSIS — N179 Acute kidney failure, unspecified: Secondary | ICD-10-CM

## 2015-05-28 DIAGNOSIS — Z8669 Personal history of other diseases of the nervous system and sense organs: Secondary | ICD-10-CM

## 2015-05-28 DIAGNOSIS — R6520 Severe sepsis without septic shock: Secondary | ICD-10-CM

## 2015-05-28 DIAGNOSIS — N131 Hydronephrosis with ureteral stricture, not elsewhere classified: Secondary | ICD-10-CM

## 2015-05-28 DIAGNOSIS — D72829 Elevated white blood cell count, unspecified: Secondary | ICD-10-CM | POA: Diagnosis not present

## 2015-05-28 DIAGNOSIS — D638 Anemia in other chronic diseases classified elsewhere: Secondary | ICD-10-CM | POA: Diagnosis not present

## 2015-05-28 DIAGNOSIS — A4151 Sepsis due to Escherichia coli [E. coli]: Secondary | ICD-10-CM

## 2015-05-28 LAB — CBC WITH DIFFERENTIAL/PLATELET
BASOS PCT: 0 % (ref 0–1)
Basophils Absolute: 0 10*3/uL (ref 0.0–0.1)
Eosinophils Absolute: 0.1 10*3/uL (ref 0.0–0.7)
Eosinophils Relative: 1 % (ref 0–5)
HCT: 37.1 % (ref 36.0–46.0)
HEMOGLOBIN: 12.6 g/dL (ref 12.0–15.0)
Lymphocytes Relative: 26 % (ref 12–46)
Lymphs Abs: 2.1 10*3/uL (ref 0.7–4.0)
MCH: 30.4 pg (ref 26.0–34.0)
MCHC: 34 g/dL (ref 30.0–36.0)
MCV: 89.6 fL (ref 78.0–100.0)
MONO ABS: 0.6 10*3/uL (ref 0.1–1.0)
MPV: 9.3 fL (ref 8.6–12.4)
Monocytes Relative: 7 % (ref 3–12)
NEUTROS ABS: 5.3 10*3/uL (ref 1.7–7.7)
Neutrophils Relative %: 66 % (ref 43–77)
Platelets: 445 10*3/uL — ABNORMAL HIGH (ref 150–400)
RBC: 4.14 MIL/uL (ref 3.87–5.11)
RDW: 13.8 % (ref 11.5–15.5)
WBC: 8.1 10*3/uL (ref 4.0–10.5)

## 2015-05-28 LAB — COMPREHENSIVE METABOLIC PANEL
ALBUMIN: 4 g/dL (ref 3.6–5.1)
ALT: 34 U/L — ABNORMAL HIGH (ref 6–29)
AST: 33 U/L (ref 10–35)
Alkaline Phosphatase: 120 U/L (ref 33–130)
BILIRUBIN TOTAL: 0.6 mg/dL (ref 0.2–1.2)
BUN: 8 mg/dL (ref 7–25)
CO2: 25 mmol/L (ref 20–31)
CREATININE: 0.79 mg/dL (ref 0.50–0.99)
Calcium: 9.5 mg/dL (ref 8.6–10.4)
Chloride: 101 mmol/L (ref 98–110)
GLUCOSE: 100 mg/dL — AB (ref 65–99)
Potassium: 4.3 mmol/L (ref 3.5–5.3)
SODIUM: 138 mmol/L (ref 135–146)
Total Protein: 7.3 g/dL (ref 6.1–8.1)

## 2015-05-28 NOTE — Patient Instructions (Signed)
If you can get an appointment with Dr. Jana Hakim

## 2015-05-28 NOTE — Progress Notes (Signed)
   Subjective:    Patient ID: Alison Zimmerman, female    DOB: 09/14/1950, 64 y.o.   MRN: [id number]  HPI She is here for posthospitalization transition of care consultation. He was admitted on December 16 and sent home on the 24th. She was diagnosed with sepsis, acute renal failure, kidney stone with hydroureteronephrosis. The stone was 10 x 15 mm in size. He did grow Escherichia coli and was placed on appropriate antibiotics. She also has underlying breast cancer however is not followed up with oncology at this point. She does have a ureteral stent and is attempting to get an appointment with urology concerning this. In the hospital she was noted to have multiple electrolyte abnormalities. Review his record indicates that mostly is a cleared up by the time she left. She also has a history of migraine headaches and does use Percogesic usually on a monthly basis for good control of this. She has had note fever, chills, abdominal pain, nausea, vomiting. In general she is doing quite well.   Review of Systems     Objective:   Physical Exam Alert and in no distress. Tympanic membranes and canals are normal. Pharyngeal area is normal. Neck is supple without adenopathy or thyromegaly. Cardiac exam shows a regular sinus rhythm without murmurs or gallops. Lungs are clear to auscultation. Abdominal exam shows normal bowel sounds without masses or tenderness. The medical record including labs, x-rays and discharge summary was reviewed. Meds were also reviewed.       Assessment & Plan:  Ductal carcinoma in situ (DCIS) of right breast - Plan: CBC with Differential/Platelet, Comprehensive metabolic panel  Acute renal failure, unspecified acute renal failure type ([facility]) - Plan: CBC with Differential/Platelet, Comprehensive metabolic panel  Anemia of chronic disease - Plan: CBC with Differential/Platelet, Comprehensive metabolic panel  Leukocytosis - Plan: CBC with Differential/Platelet, Comprehensive  metabolic panel  Bacteremia due to Escherichia coli  Hydroureteronephrosis  Sepsis due to Escherichia coli with acute renal failure (HCC)  History of migraine headaches  at this time she does seem to be doing quite well. Before she left the hospital he acute renal failure had returned to normal. Most of her electrolytes were also back to normal. Since being properly treated and she is showing no evidence of continued difficulty with that. She will need follow-up on several of these to be on the safe side. He is in the process of attempting to set up an appointment with urology as well as oncology. She will call if she has difficulty with this. Over 45 minutes spent, greater than 50% in counseling and coordination of care on this for a complicated case. All call if she has difficulty getting 0.0 with urology or oncology.

## 2015-06-10 ENCOUNTER — Other Ambulatory Visit: Payer: Self-pay | Admitting: Urology

## 2015-06-11 ENCOUNTER — Encounter (HOSPITAL_BASED_OUTPATIENT_CLINIC_OR_DEPARTMENT_OTHER): Payer: Self-pay | Admitting: *Deleted

## 2015-06-11 NOTE — Progress Notes (Signed)
NPO AFTER MN.  ARRIVE AT 1030.  NEEDS BMET.  OTHER CURRENT LAB RESULTS IN CHART AND EPIC.

## 2015-06-16 ENCOUNTER — Ambulatory Visit (HOSPITAL_BASED_OUTPATIENT_CLINIC_OR_DEPARTMENT_OTHER): Payer: No Typology Code available for payment source | Admitting: Anesthesiology

## 2015-06-16 ENCOUNTER — Encounter (HOSPITAL_BASED_OUTPATIENT_CLINIC_OR_DEPARTMENT_OTHER)
Admission: RE | Disposition: A | Discharge status: Home / Self Care | Payer: Self-pay | Source: Ambulatory Visit | Attending: Urology

## 2015-06-16 ENCOUNTER — Encounter (HOSPITAL_BASED_OUTPATIENT_CLINIC_OR_DEPARTMENT_OTHER): Payer: Self-pay | Admitting: *Deleted

## 2015-06-16 ENCOUNTER — Ambulatory Visit (HOSPITAL_BASED_OUTPATIENT_CLINIC_OR_DEPARTMENT_OTHER)
Admission: RE | Admit: 2015-06-16 | Discharge: 2015-06-16 | Disposition: A | Discharge status: Home / Self Care | Payer: No Typology Code available for payment source | Source: Ambulatory Visit | Attending: Urology | Admitting: Urology

## 2015-06-16 DIAGNOSIS — N132 Hydronephrosis with renal and ureteral calculous obstruction: Secondary | ICD-10-CM | POA: Insufficient documentation

## 2015-06-16 DIAGNOSIS — N1330 Unspecified hydronephrosis: Secondary | ICD-10-CM

## 2015-06-16 HISTORY — PX: CYSTOSCOPY WITH RETROGRADE PYELOGRAM, URETEROSCOPY AND STENT PLACEMENT: SHX5789

## 2015-06-16 HISTORY — PX: HOLMIUM LASER APPLICATION: SHX5852

## 2015-06-16 HISTORY — PX: STONE EXTRACTION WITH BASKET: SHX5318

## 2015-06-16 HISTORY — DX: Calculus of ureter: N20.1

## 2015-06-16 HISTORY — DX: Personal history of other infectious and parasitic diseases: Z86.19

## 2015-06-16 LAB — POCT I-STAT, CHEM 8
BUN: 9 mg/dL (ref 6–20)
CREATININE: 0.8 mg/dL (ref 0.44–1.00)
Calcium, Ion: 1.25 mmol/L (ref 1.13–1.30)
Chloride: 103 mmol/L (ref 101–111)
GLUCOSE: 86 mg/dL (ref 65–99)
HCT: 44 % (ref 36.0–46.0)
Hemoglobin: 15 g/dL (ref 12.0–15.0)
Potassium: 4.1 mmol/L (ref 3.5–5.1)
Sodium: 142 mmol/L (ref 135–145)
TCO2: 27 mmol/L (ref 0–100)

## 2015-06-16 LAB — BASIC METABOLIC PANEL
Anion gap: 12 (ref 5–15)
BUN: 10 mg/dL (ref 6–20)
CALCIUM: 10 mg/dL (ref 8.9–10.3)
CHLORIDE: 106 mmol/L (ref 101–111)
CO2: 26 mmol/L (ref 22–32)
CREATININE: 0.79 mg/dL (ref 0.44–1.00)
GFR calc non Af Amer: >60 mL/min (ref >60)
GLUCOSE: 91 mg/dL (ref 65–99)
Potassium: 4.1 mmol/L (ref 3.5–5.1)
Sodium: 144 mmol/L (ref 135–145)

## 2015-06-16 SURGERY — CYSTOURETEROSCOPY, WITH RETROGRADE PYELOGRAM AND STENT INSERTION
Anesthesia: General | Site: Renal | Laterality: Right

## 2015-06-16 MED ORDER — IOHEXOL 350 MG/ML SOLN
INTRAVENOUS | Status: DC | PRN
  Administered 2015-06-16: 10 mL

## 2015-06-16 MED ORDER — GENTAMICIN SULFATE 40 MG/ML IJ SOLN
1.5000 mg/kg | INTRAVENOUS | Status: DC
  Filled 2015-06-16: qty 2

## 2015-06-16 MED ORDER — CIPROFLOXACIN HCL 500 MG PO TABS: 500.0000 mg | ORAL_TABLET | Freq: Two times a day (BID) | ORAL | Status: DC

## 2015-06-16 MED ORDER — ONDANSETRON HCL 4 MG/2ML IJ SOLN
INTRAMUSCULAR | Status: AC
  Filled 2015-06-16: qty 2

## 2015-06-16 MED ORDER — HYDROCODONE-ACETAMINOPHEN 5-325 MG PO TABS: 1.0000 | ORAL_TABLET | Freq: Four times a day (QID) | ORAL | Status: DC | PRN

## 2015-06-16 MED ORDER — AMPICILLIN SODIUM 1 G IJ SOLR
INTRAMUSCULAR | Status: AC
  Filled 2015-06-16: qty 1000

## 2015-06-16 MED ORDER — ONDANSETRON HCL 4 MG/2ML IJ SOLN
INTRAMUSCULAR | Status: DC | PRN
  Administered 2015-06-16: 4 mg via INTRAVENOUS

## 2015-06-16 MED ORDER — ACETAMINOPHEN 160 MG/5ML PO SOLN
650.0000 mg | Freq: Once | ORAL | Status: AC
  Administered 2015-06-16: 650 mg via ORAL
  Filled 2015-06-16 (×2): qty 20.3

## 2015-06-16 MED ORDER — FENTANYL CITRATE (PF) 100 MCG/2ML IJ SOLN
25.0000 ug | INTRAMUSCULAR | Status: DC | PRN
  Filled 2015-06-16: qty 1

## 2015-06-16 MED ORDER — KETOROLAC TROMETHAMINE 30 MG/ML IJ SOLN
INTRAMUSCULAR | Status: AC
  Filled 2015-06-16: qty 1

## 2015-06-16 MED ORDER — PROPOFOL 10 MG/ML IV BOLUS
INTRAVENOUS | Status: DC | PRN
  Administered 2015-06-16: 130 mg via INTRAVENOUS

## 2015-06-16 MED ORDER — DEXAMETHASONE SODIUM PHOSPHATE 10 MG/ML IJ SOLN
INTRAMUSCULAR | Status: AC
  Filled 2015-06-16: qty 1

## 2015-06-16 MED ORDER — SODIUM CHLORIDE 0.9 % IR SOLN
Status: DC | PRN
  Administered 2015-06-16: 2000 mL

## 2015-06-16 MED ORDER — MIDAZOLAM HCL 2 MG/2ML IJ SOLN
INTRAMUSCULAR | Status: AC
  Filled 2015-06-16: qty 2

## 2015-06-16 MED ORDER — KETOROLAC TROMETHAMINE 30 MG/ML IJ SOLN
INTRAMUSCULAR | Status: DC | PRN
  Administered 2015-06-16: 30 mg via INTRAVENOUS

## 2015-06-16 MED ORDER — MIDAZOLAM HCL 5 MG/5ML IJ SOLN
INTRAMUSCULAR | Status: DC | PRN
  Administered 2015-06-16: 2 mg via INTRAVENOUS

## 2015-06-16 MED ORDER — LACTATED RINGERS IV SOLN
INTRAVENOUS | Status: DC
  Administered 2015-06-16 (×2): via INTRAVENOUS
  Filled 2015-06-16: qty 1000

## 2015-06-16 MED ORDER — DEXAMETHASONE SODIUM PHOSPHATE 4 MG/ML IJ SOLN
INTRAMUSCULAR | Status: DC | PRN
  Administered 2015-06-16: 10 mg via INTRAVENOUS

## 2015-06-16 MED ORDER — GENTAMICIN SULFATE 40 MG/ML IJ SOLN
5.0000 mg/kg | INTRAVENOUS | Status: AC
  Administered 2015-06-16: 250 mg via INTRAVENOUS
  Filled 2015-06-16 (×2): qty 6.25

## 2015-06-16 MED ORDER — PROPOFOL 10 MG/ML IV BOLUS
INTRAVENOUS | Status: AC
  Filled 2015-06-16: qty 40

## 2015-06-16 MED ORDER — FENTANYL CITRATE (PF) 100 MCG/2ML IJ SOLN
INTRAMUSCULAR | Status: AC
  Filled 2015-06-16: qty 4

## 2015-06-16 MED ORDER — SODIUM CHLORIDE 0.9 % IV SOLN
INTRAVENOUS | Status: AC
  Filled 2015-06-16: qty 50

## 2015-06-16 MED ORDER — LIDOCAINE HCL (CARDIAC) 20 MG/ML IV SOLN
INTRAVENOUS | Status: DC | PRN
  Administered 2015-06-16: 60 mg via INTRAVENOUS

## 2015-06-16 MED ORDER — FENTANYL CITRATE (PF) 100 MCG/2ML IJ SOLN
INTRAMUSCULAR | Status: DC | PRN
  Administered 2015-06-16: 100 ug via INTRAVENOUS

## 2015-06-16 MED ORDER — PROPOFOL 10 MG/ML IV BOLUS: INTRAVENOUS | Status: DC | PRN

## 2015-06-16 MED ORDER — LIDOCAINE HCL (CARDIAC) 20 MG/ML IV SOLN
INTRAVENOUS | Status: AC
  Filled 2015-06-16: qty 5

## 2015-06-16 MED ORDER — AMPICILLIN SODIUM 2 G IJ SOLR
2.0000 g | INTRAMUSCULAR | Status: AC
  Administered 2015-06-16: 1 g via INTRAVENOUS
  Filled 2015-06-16: qty 2000

## 2015-06-16 SURGICAL SUPPLY — 20 items
BAG URO CATCHER STRL LF (MISCELLANEOUS) ×3 IMPLANT
BASKET ZERO TIP NITINOL 2.4FR (BASKET) ×3 IMPLANT
CATH URET 5FR 28IN OPEN ENDED (CATHETERS) ×3 IMPLANT
CLOTH BEACON ORANGE TIMEOUT ST (SAFETY) ×3 IMPLANT
FIBER LASER TRAC TIP (UROLOGICAL SUPPLIES) ×3 IMPLANT
GLOVE BIO SURGEON STRL SZ7.5 (GLOVE) ×3 IMPLANT
GLOVE SURG SS PI 7.5 STRL IVOR (GLOVE) ×6 IMPLANT
GOWN STRL REUS W/ TWL XL LVL3 (GOWN DISPOSABLE) ×1 IMPLANT
GOWN STRL REUS W/TWL XL LVL3 (GOWN DISPOSABLE) ×5 IMPLANT
GUIDEWIRE STR DUAL SENSOR (WIRE) ×3 IMPLANT
IV NS IRRIG 3000ML ARTHROMATIC (IV SOLUTION) ×3 IMPLANT
KIT ROOM TURNOVER WOR (KITS) ×3 IMPLANT
MANIFOLD NEPTUNE II (INSTRUMENTS) ×3 IMPLANT
NS IRRIG 500ML POUR BTL (IV SOLUTION) IMPLANT
PACK CYSTOSCOPY (CUSTOM PROCEDURE TRAY) ×3 IMPLANT
SCRUB PCMX 4 OZ (MISCELLANEOUS) ×3 IMPLANT
STENT URET 6FRX24 CONTOUR (STENTS) ×3 IMPLANT
SYRINGE IRR TOOMEY STRL 70CC (SYRINGE) ×3 IMPLANT
TUBE CONNECTING 12'X1/4 (SUCTIONS) ×1
TUBE CONNECTING 12X1/4 (SUCTIONS) ×2 IMPLANT

## 2015-06-16 NOTE — Discharge Instructions (Signed)

## 2015-06-16 NOTE — Anesthesia Procedure Notes (Signed)
Procedure Name: LMA Insertion Date/Time: 06/16/2015 12:14 PM Performed by: Wanita Chamberlain Pre-anesthesia Checklist: Patient identified, Timeout performed, Emergency Drugs available, Suction available and Patient being monitored Patient Re-evaluated:Patient Re-evaluated prior to inductionOxygen Delivery Method: Circle system utilized Preoxygenation: Pre-oxygenation with 100% oxygen Intubation Type: IV induction Ventilation: Mask ventilation without difficulty LMA: LMA inserted LMA Size: 4.0 Number of attempts: 1 Placement Confirmation: breath sounds checked- equal and bilateral and positive ETCO2 Tube secured with: Tape Dental Injury: Teeth and Oropharynx as per pre-operative assessment

## 2015-06-16 NOTE — H&P (View-Only) (Signed)
Consult Note:  Subjective: Patient to be taken to the OR for cystoscopy and attempted right ureteral stent placement as IR is uncomfortable with right nephrostomy tube placement given that she is a Jehovah's Witness and has low platelets and elevated INR. Tachycardic overnight but afebrile with stable BPs.   Objective: Vital signs in last 24 hours: Temp:  [97.4 F (36.3 C)-98.6 F (37 C)] 98.2 F (36.8 C) (12/17 0746) Pulse Rate:  [64-132] 106 (12/17 0800) Resp:  [16-30] 20 (12/17 0800) BP: (74-126)/(52-88) 112/68 mmHg (12/17 0800) SpO2:  [96 %-100 %] 99 % (12/17 0800) Weight:  [55.157 kg (121 lb 9.6 oz)-58.5 kg (128 lb 15.5 oz)] 58.5 kg (128 lb 15.5 oz) (12/17 0400)  Intake/Output from previous day: 12/16 0701 - 12/17 0700 In: 1050 [IV Piggyback:1050] Out: -  Intake/Output this shift: Total I/O In: 110 [Other:10; IV Piggyback:100] Out: 1000 [Urine:1000]  Physical Exam:  General:alert, cooperative and appears stated age, no acute distress GI: soft, right upper quadrant mildly tender to palpation, non-distended.  Back: +Right CVA tenderness Extremities: extremities normal, atraumatic, no cyanosis or edema  Lab Results:  Recent Labs  05/15/15 0001 05/16/15 1711 05/17/15 0150  HGB 12.8 13.4 11.0*  HCT 38.3 39.0 32.2*   BMET  Recent Labs  05/16/15 1711 05/17/15 0150  NA 127* 138  K 3.0* 4.4  CL 90* 111  CO2 18* 15*  GLUCOSE 118* 103*  BUN 38* 27*  CREATININE 3.32* 2.04*  CALCIUM 8.1* 7.1*    Recent Labs  05/17/15 0100 05/17/15 0150  INR 1.53* 1.58*   No results for input(s): LABURIN in the last 72 hours. Results for orders placed or performed during the hospital encounter of 05/16/15  MRSA PCR Screening     Status: None   Collection Time: 05/17/15  1:35 AM  Result Value Ref Range Status   MRSA by PCR NEGATIVE NEGATIVE Final    Comment:        The GeneXpert MRSA Assay (FDA approved for NASAL specimens only), is one component of a comprehensive  MRSA colonization surveillance program. It is not intended to diagnose MRSA infection nor to guide or monitor treatment for MRSA infections.     Studies/Results: Dg Chest Portable 1 View  05/16/2015  CLINICAL DATA:  Pt was seen yesterday and diagnosed with bladder infection. Having right lower flank pain with no associated dysuria. Emesis x1 today.Pt is tachypneic and WBC reported at 30.2. Also c/o RUQ chest pain, diarrhea, and dizziness. Nonsmoker. EXAM: PORTABLE CHEST 1 VIEW COMPARISON:  07/26/2014 FINDINGS: Prior median sternotomy. Mild right hemidiaphragm elevation. Midline trachea. Borderline cardiomegaly. No pleural effusion or pneumothorax. Small calcified right lung base nodules are likely related to old granulomatous disease. IMPRESSION: No acute cardiopulmonary disease. Electronically Signed   By: [name] M.D.   On: 05/16/2015 18:01   Ct Renal Stone Study  05/16/2015  CLINICAL DATA:  Patient was seen yesterday and diagnosed with bladder infection. Now having right lower flank pain. Vomiting today. White cell count of 30. Right upper chest pain. Diarrhea. Dizziness. EXAM: CT ABDOMEN AND PELVIS WITHOUT CONTRAST TECHNIQUE: Multidetector CT imaging of the abdomen and pelvis was performed following the standard protocol without IV contrast. COMPARISON:  None. FINDINGS: Atelectasis in the lung bases. There is an ovoid stone in the mid right ureter at the level of the sacrum measuring 6 x 12 mm. There is another smaller stone in the mid right ureter measuring 5 mm diameter. There is prominent hydronephrosis and hydroureter above the  level of the stones. Stranding around the kidney and ureters is likely due to obstruction. Infection not excluded. No left renal or ureteral stones. No bladder stones or bladder wall thickening. No hydronephrosis or hydroureter on the left. The unenhanced appearance of the liver, spleen, adrenal glands, pancreas, abdominal aorta, inferior vena cava, and  retroperitoneal lymph nodes is unremarkable. Small amount of fluid along the right pericolic gutter likely resulting from the urinary tract process. No free fluid or free air in the abdomen. Stomach, small bowel, and colon are not abnormally distended. Pelvis: Uterus and ovaries are not enlarged. No free or loculated pelvic fluid collections. No pelvic mass or lymphadenopathy. Bladder wall is not thickened. No destructive bone lesions. Vertebral hemangiomas at T12. IMPRESSION: 6 x 12 mm stone and 5 mm stone in the mid right ureter at the level of the sacrum with severe proximal obstructive change. Infiltration around the right kidney and ureter may be due to obstruction or infection. Electronically Signed   By: [name] M.D.   On: 05/16/2015 22:56    Assessment/Plan: 65 yo F presenting with a very large right 15 x 7 mm mid-ureteral stone and a second 5 mm stone just distal to this and severe hydroureteronephrosis in the setting of sepsis of urinary origin. Persistent mild tachycardia despite aggressive fluid resuscitation and improved hypotension and subjective fevers at home.   Plan:  - Take to OR urgently for cystoscopy and attempted right ureteral stent placement - Recommend broad spectrum IV antibiotics per primary team - Follow up blood and urine cultures - Recommend foley catheter placement for complete decompression of urinary system given urosepsis - particularly following stent placement as this is a refluxing system - NPO now - Continued aggressive hydration and resucitation per primary team - In the future she will need outpatient evaluation at [facility] with Dr. Pilar Jarvis for discussion of definitive treatment of the right ureteral stones after she has been fully treated for her urosepsis.    LOS: 1 day   Acie Fredrickson 05/17/2015, 8:43 AM

## 2015-06-16 NOTE — Op Note (Signed)
Date of procedure: 06/16/2015  Preoperative diagnosis:  1. Right ureteral stone  Postoperative diagnosis:  1. Right ureteral stone   Procedure: 1. Cystoscopy 2. Right ureteroscopy 3. Laser lithotripsy 4. Stone basketing 5. Right retrograde polygrams with interpretation 6. Right ureteral stent exchange (6 [place] by 24 cm)  Surgeon: Baruch Gouty, MD  Anesthesia: General  Complications: None  Intraoperative findings: Right ureteral stone was identified in the mid ureter. It was broken into small pieces laser lithotripsy with stone basket. Retrograde powder was unremarkable to the procedure. Stent was in the right place on fluoroscopy.  EBL: None  Specimens: Right ureteral stone office lab  Drains: 6 French by 24 cm right double-J ureteral stent  Disposition: Stable to the postanesthesia care unit  Indication for procedure: The patient is a 65 y.o. female with 1.2 mm right ureteral stone status post right ureteral stent placed emergently for sepsis presents now after recovering from her infection for definitive stone management.  After reviewing the management options for treatment, the patient elected to proceed with the above surgical procedure(s). We have discussed the potential benefits and risks of the procedure, side effects of the proposed treatment, the likelihood of the patient achieving the goals of the procedure, and any potential problems that might occur during the procedure or recuperation. Informed consent has been obtained.  Description of procedure: The patient was met in the preoperative area. All risks, benefits, and indications of the procedure were described in great detail. The patient consented to the procedure. Preoperative antibiotics were given. The patient was taken to the operative theater. General anesthesia was induced per the anesthesia service. The patient was then placed in the dorsal lithotomy position and prepped and draped in the usual sterile  fashion. A preoperative timeout was called. A 21 French 30 cystoscope was inserted into the patient's bladder per urethra atraumatically. The right ureteral stent was visualized and grasped flexible graspers and brought to level urethral meatus. A sensor wire was then exchanged for the right ureteral stent under fluoroscopy to the level of the renal pelvis. The stent was removed. A semirigid ureteroscope was inserted into the patient's bladder and into the right ureteral orifice atraumatically. The right ureteral stone was visualized in the mid ureter. Laser lithotripsy broke the stone into small fragments. These fragments were then removed with the stone basket and sent to pathology. Pan ureteroscopy at this time was then unremarkable for any remaining large fragments. Right retrograde pyelogram was unremarkable for any residual filling defects. The ureteroscope was removed. The cystoscope was then exchanged over the sensor wire. A 6 French by 24 cm double-J ureteral stent was placed over the sensor wire. The sensor wire was removed. The stent was confirmed to be in the correct placement with a curl seen in the patient's renal pelvis on fluoroscopy and a curl seen in the patient's urinary bladder for mobilization. Patient's bladder was then drained. The patient was then woken from anesthesia and transferred stable condition to the postanesthesia care unit with no apparent complications.   Plan: The patient will follow-up in one to 2 weeks for stent removal. She'll need a renal ultrasound 1 month following stent removal to rule out iatrogenic hydronephrosis.   Baruch Gouty, M.D.

## 2015-06-16 NOTE — Transfer of Care (Signed)
Immediate Anesthesia Transfer of Care Note  Patient: Alison Zimmerman  Procedure(s) Performed: Procedure(s): CYSTOSCOPY WITH RIGHT RETROGRADE PYELOGRAM, URETEROSCOPY AND STENT EXCHANGE (Right) HOLMIUM LASER APPLICATION (Right) STONE EXTRACTION WITH BASKET (Right)  Patient Location: PACU  Anesthesia Type:General  Level of Consciousness: awake, alert , oriented and patient cooperative  Airway & Oxygen Therapy: Patient Spontanous Breathing and Patient connected to nasal cannula oxygen  Post-op Assessment: Report given to RN and Post -op Vital signs reviewed and stable  Post vital signs: Reviewed and stable  Last Vitals:  Filed Vitals:   06/16/15 1325 06/16/15 1330  BP:  130/63  Pulse: 74 83  Temp:    Resp: 13 14    Complications: No apparent anesthesia complications

## 2015-06-16 NOTE — Interval H&P Note (Signed)
History and Physical Interval Note:  06/16/2015 9:45 AM  Alison Zimmerman  has presented today for surgery, with the diagnosis of RIGHT URETERAL STONE  The various methods of treatment have been discussed with the patient and family. After consideration of risks, benefits and other options for treatment, the patient has consented to  Procedure(s): CYSTOSCOPY WITH RIGHT RETROGRADE PYELOGRAM, URETEROSCOPY AND STENT EXCHANGE (Right) HOLMIUM LASER APPLICATION (Right) as a surgical intervention .  The patient's history has been reviewed, patient examined, no change in status, stable for surgery.  I have reviewed the patient's chart and labs.  Questions were answered to the patient's satisfaction.     Nickie Retort

## 2015-06-16 NOTE — Anesthesia Preprocedure Evaluation (Addendum)
Anesthesia Evaluation  Patient identified by MRN, date of birth, ID band Patient awake    Reviewed: Allergy & Precautions, H&P , Patient's Chart, lab work & pertinent test results, reviewed documented beta blocker date and time   Airway Mallampati: II  TM Distance: >3 FB Neck ROM: full    Dental no notable dental hx. (+) Teeth Intact, Dental Advisory Given, Implants,    Pulmonary    Pulmonary exam normal breath sounds clear to auscultation       Cardiovascular Exercise Tolerance: Good  Rhythm:regular Rate:Normal     Neuro/Psych Anxiety    GI/Hepatic   Endo/Other    Renal/GU Renal Calculus     Musculoskeletal   Abdominal   Peds  Hematology  (+) anemia ,   Anesthesia Other Findings Jehovah Witness- No blood except Pt will accept "Platelets, Plasma or Albumin"  Reproductive/Obstetrics                           Anesthesia Physical Anesthesia Plan  ASA: II  Anesthesia Plan:    Post-op Pain Management:    Induction: Intravenous  Airway Management Planned: LMA  Additional Equipment:   Intra-op Plan:   Post-operative Plan:   Informed Consent: I have reviewed the patients History and Physical, chart, labs and discussed the procedure including the risks, benefits and alternatives for the proposed anesthesia with the patient or authorized representative who has indicated his/her understanding and acceptance.   Dental Advisory Given and Dental advisory given  Plan Discussed with: CRNA and Surgeon  Anesthesia Plan Comments: (Discussed GA with LMA, possible sore throat, potential need to switch to ETT, N/V, pulmonary aspiration. Questions answered. )        Anesthesia Quick Evaluation

## 2015-06-17 ENCOUNTER — Encounter (HOSPITAL_BASED_OUTPATIENT_CLINIC_OR_DEPARTMENT_OTHER): Payer: Self-pay | Admitting: Urology

## 2015-06-19 NOTE — Anesthesia Postprocedure Evaluation (Signed)
Anesthesia Post Note  Patient: Alison Zimmerman  Procedure(s) Performed: Procedure(s) (LRB): CYSTOSCOPY WITH RIGHT RETROGRADE PYELOGRAM, URETEROSCOPY AND STENT EXCHANGE (Right) HOLMIUM LASER APPLICATION (Right) STONE EXTRACTION WITH BASKET (Right)  Patient location during evaluation: PACU Anesthesia Type: General Level of consciousness: sedated Pain management: satisfactory to patient Vital Signs Assessment: post-procedure vital signs reviewed and stable Respiratory status: spontaneous breathing Cardiovascular status: stable Anesthetic complications: no    Last Vitals:  Filed Vitals:   06/16/15 1345 06/16/15 1421  BP: 140/74 128/67  Pulse: 72 79  Temp:  36.8 C  Resp: 13 14    Last Pain:  Filed Vitals:   06/16/15 1423  PainSc: Asleep                 Tylisa Alcivar EDWARD

## 2015-08-18 ENCOUNTER — Ambulatory Visit (INDEPENDENT_AMBULATORY_CARE_PROVIDER_SITE_OTHER): Payer: No Typology Code available for payment source | Admitting: Family Medicine

## 2015-08-18 VITALS — BP 122/80 | HR 80 | Temp 98.0°F | Wt 115.4 lb

## 2015-08-18 DIAGNOSIS — N952 Postmenopausal atrophic vaginitis: Secondary | ICD-10-CM | POA: Diagnosis not present

## 2015-08-18 MED ORDER — ESTRADIOL 0.1 MG/GM VA CREA: 1.0000 | TOPICAL_CREAM | Freq: Every day | VAGINAL | Status: DC

## 2015-08-18 NOTE — Progress Notes (Signed)
   Subjective:    Patient ID: Alison Zimmerman, female    DOB: 1951/03/15, 65 y.o.   MRN: [id number]  HPI She complains of comfort with sexual activity. She has noted this especially over the last several months. She was concerned over this being a infection and has used OTC meds without much success.   Review of Systems     Objective:   Physical Exam Alert and in no distress. Vaginal exam shows the tissue to be totally normal. KOH and wet prep were negative. No palpable tenderness or visible lesions were seen.       Assessment & Plan:  Atrophic vaginitis - Plan: estradiol (ESTRACE VAGINAL) 0.1 MG/GM vaginal cream expired that this is most likely due to atrophy from going through menopause. Recommend use of estrogen topical. Explained the need for possibly also using K wire to help with lubrication.

## 2015-09-07 IMAGING — MG MM DIGITAL DIAGNOSTIC UNILAT*R*
2 series · 2 of 2 positions shown · non-contrast
Comparison: Screening mammogram 07/04/2013

CLINICAL DATA: Patient recalled from screening for right breast
calcifications.

EXAM:
DIGITAL DIAGNOSTIC  RIGHT MAMMOGRAM

[R CC]
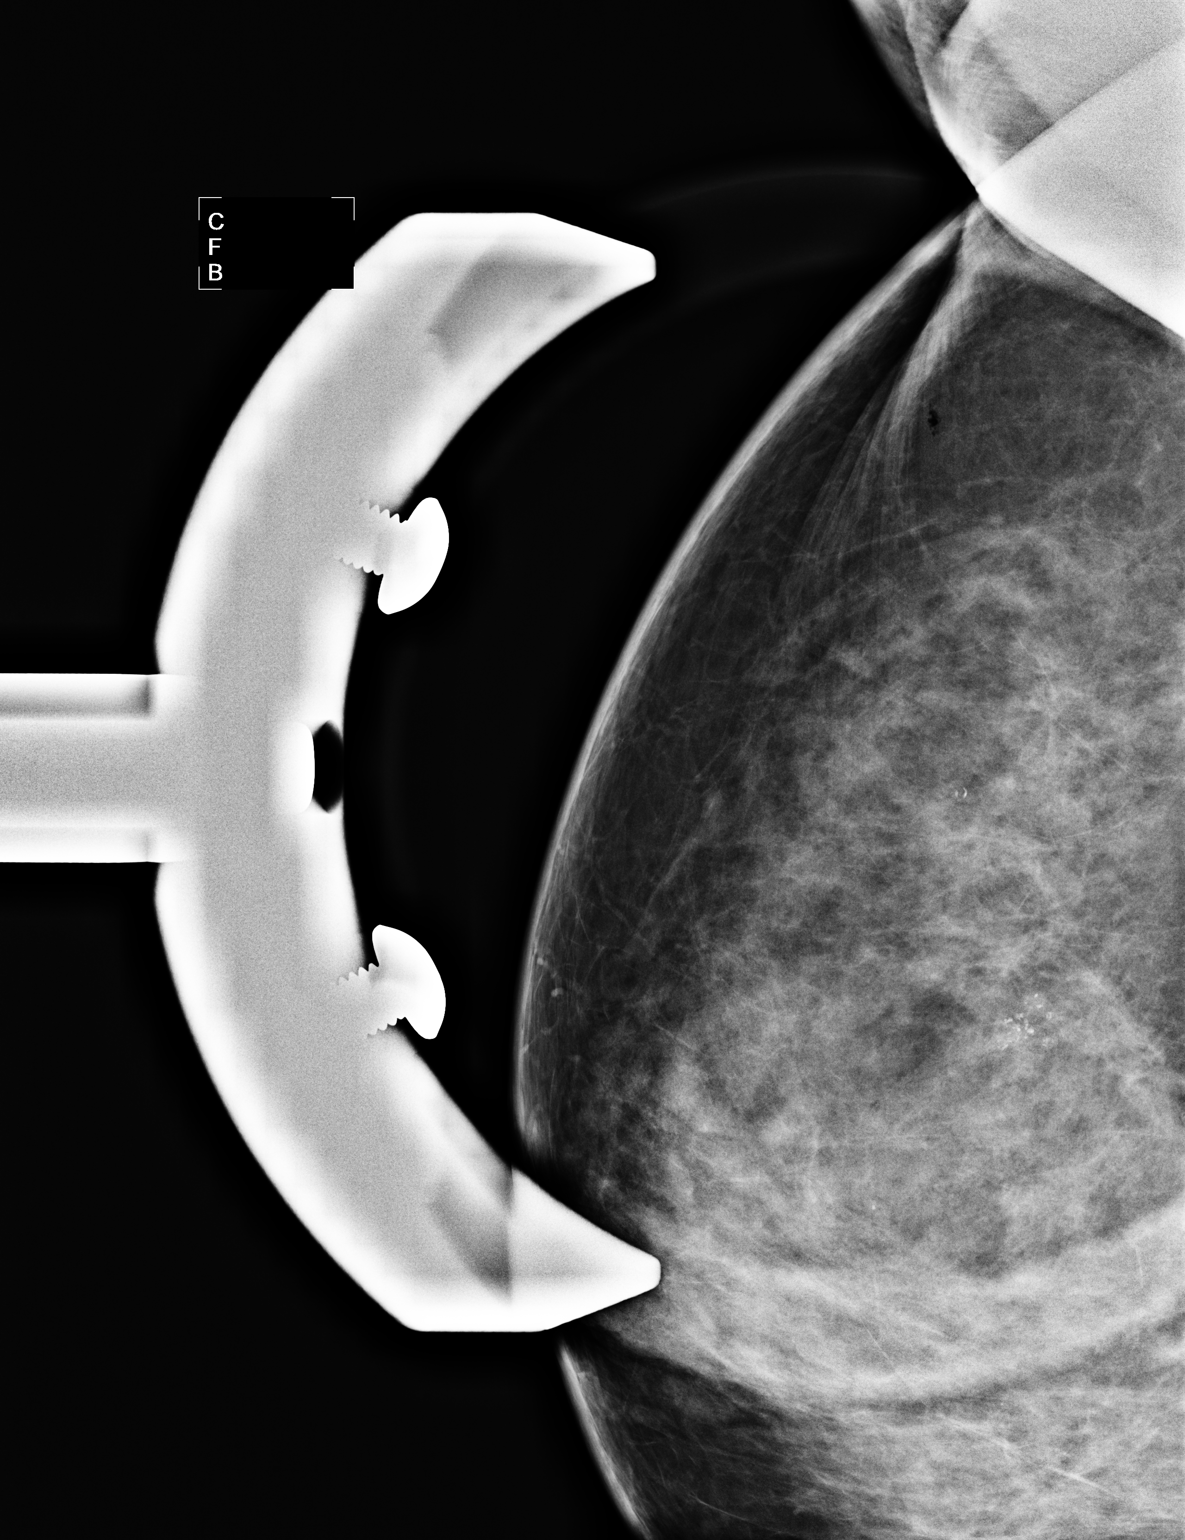

[R ML]
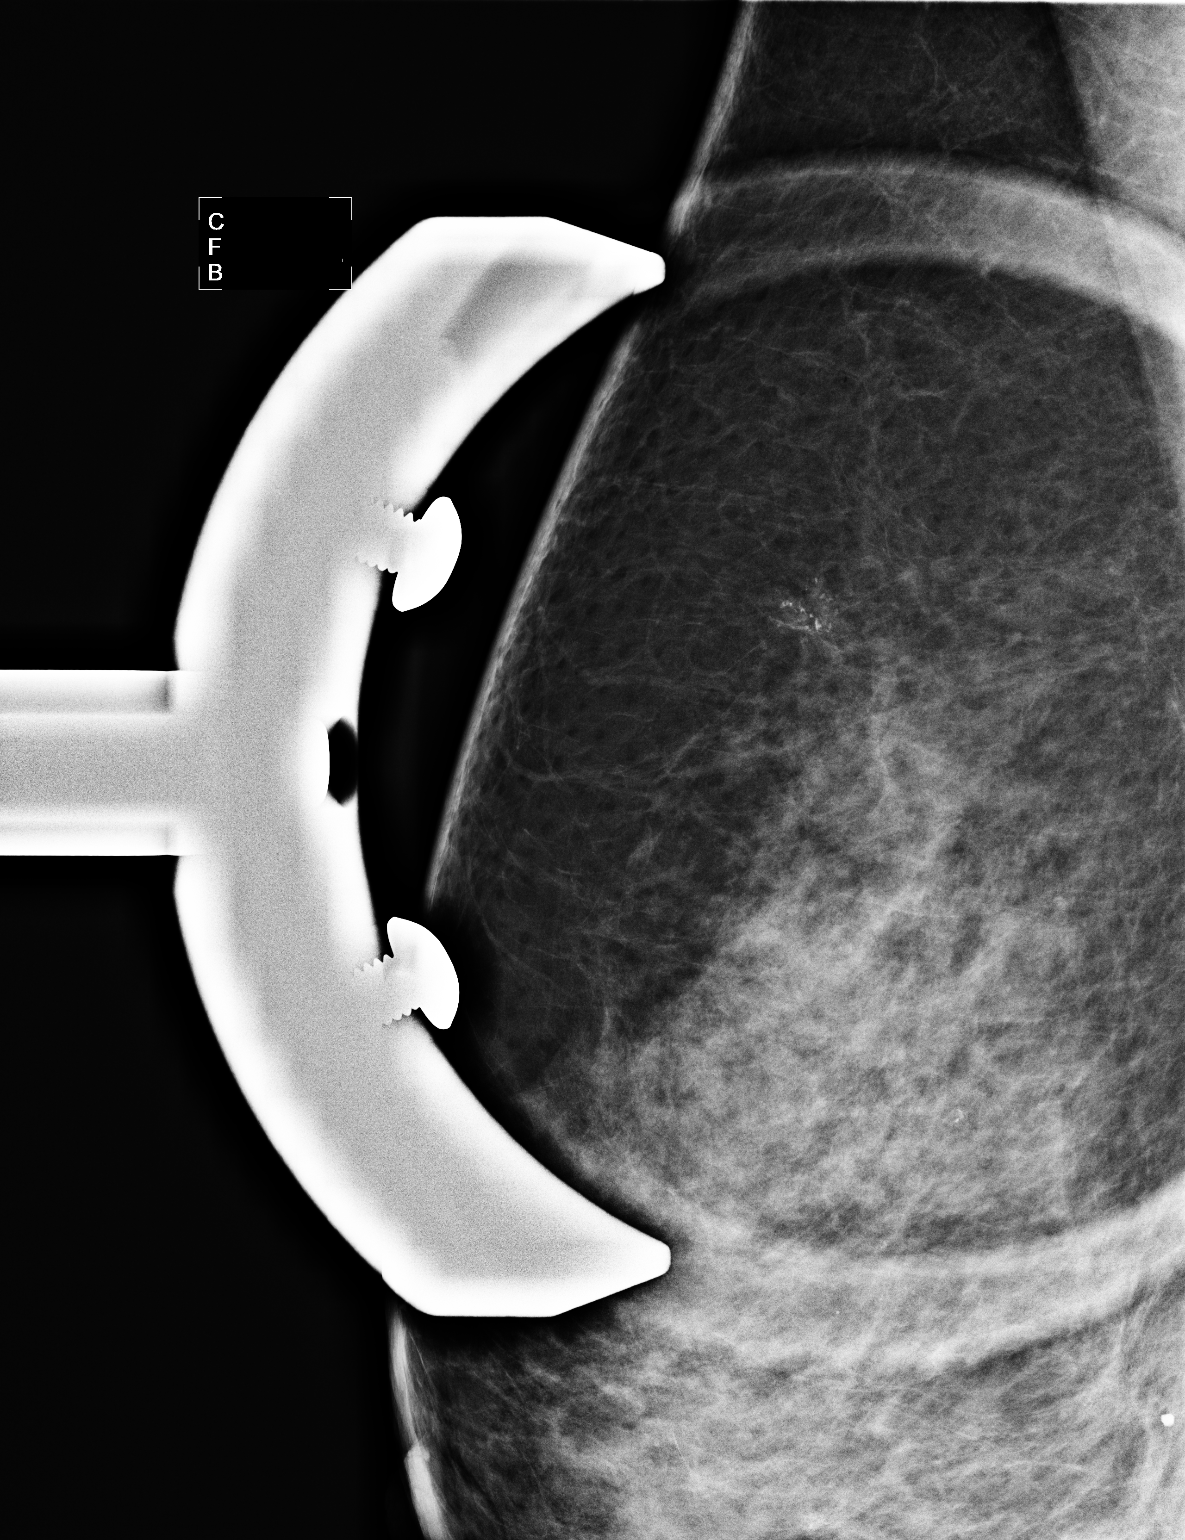

[2 of 2 positions shown; findings below may reference images not displayed]

ACR Breast Density Category c: The breast tissue is heterogeneously
dense, which may obscure small masses.
FINDINGS: Within the upper-outer quadrant of the right breast middle to
posterior depth there is a 7 x 6 x 6 mm group of calcifications of
varying sizes and shapes.
IMPRESSION: Suspicious right breast calcifications.

RECOMMENDATION:
Stereotactic guided core needle biopsy right breast calcifications.
Biopsy scheduled for [REDACTED] August 13, 2013 at 10 a.m..

I have discussed the findings and recommendations with the patient.
Results were also provided in writing at the conclusion of the
visit. If applicable, a reminder letter will be sent to the patient
regarding the next appointment.

BI-RADS CATEGORY  4: Suspicious abnormality - biopsy should be
considered.

## 2015-10-05 ENCOUNTER — Other Ambulatory Visit: Payer: Self-pay | Admitting: Medical

## 2015-10-06 NOTE — Telephone Encounter (Signed)
This is no longer on pts medication list, Is this ok to refill?

## 2015-10-15 ENCOUNTER — Other Ambulatory Visit: Payer: Self-pay | Admitting: Medical

## 2015-10-15 NOTE — Telephone Encounter (Signed)
Is this ok to refill? Pt does not have this listed on her active med list

## 2015-11-16 ENCOUNTER — Other Ambulatory Visit: Payer: Self-pay | Admitting: Medical

## 2015-11-17 NOTE — Telephone Encounter (Signed)
This is a [place]ane pt

## 2015-11-17 NOTE — Telephone Encounter (Signed)
She needs to come in for a med check appointment. Her last vitamin D level was in 2015

## 2015-11-17 NOTE — Telephone Encounter (Signed)
Is this ok to refill? Not on current medication list

## 2015-11-17 NOTE — Telephone Encounter (Signed)
See msg

## 2015-12-25 IMAGING — US US ABDOMEN LIMITED
1 series · 14 of 25 positions shown · non-contrast
Comparison: 05/16/2015

CLINICAL DATA: Elevated LFTs

EXAM:
US ABDOMEN LIMITED - RIGHT UPPER QUADRANT

[Series 1: us abdomen limited · 0.23mm/px · 14 of 56 slices shown]
[im 1/56]
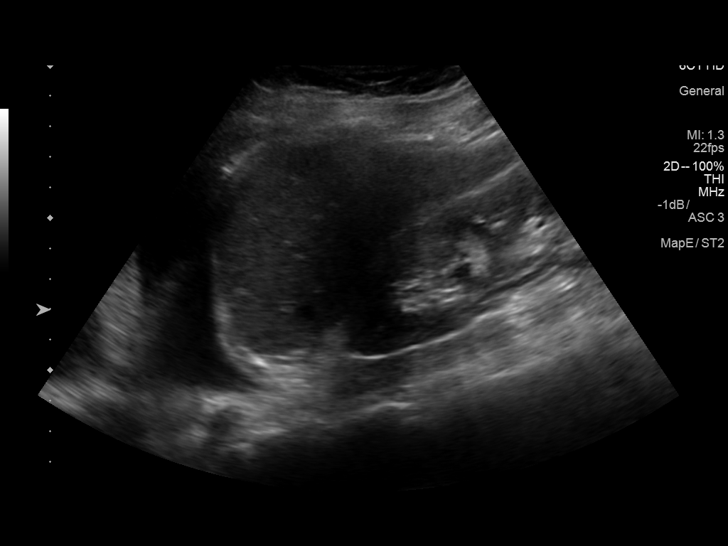
[im 5/56]
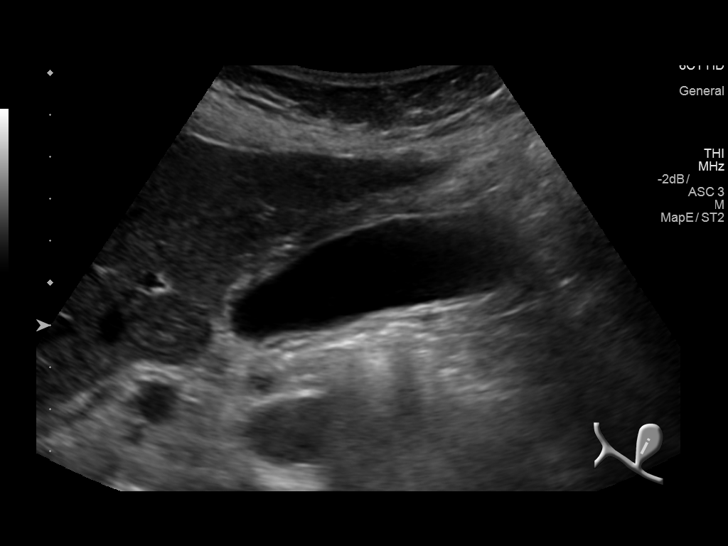
[im 10/56]
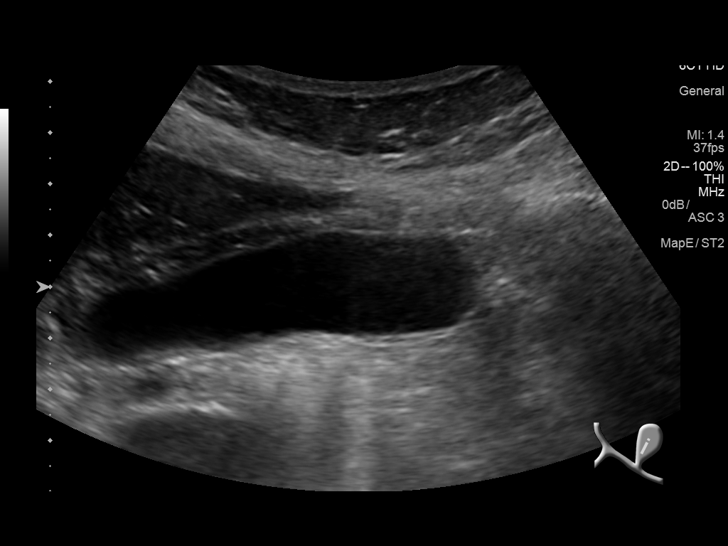
[im 14/56]
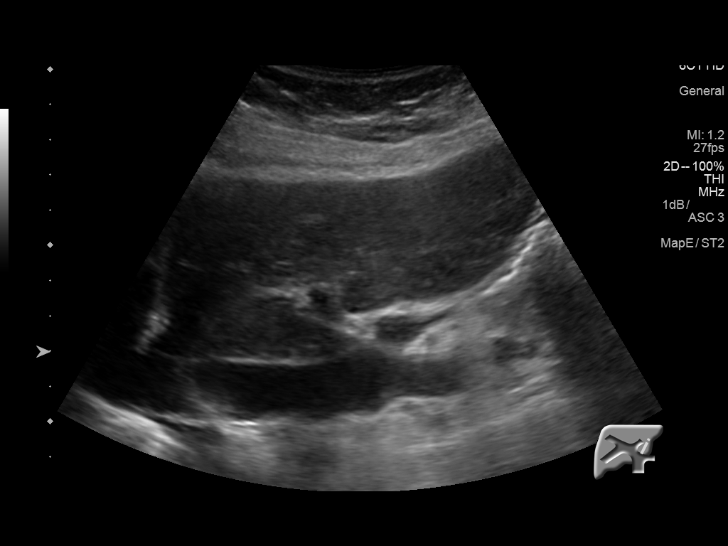
[im 19/56]
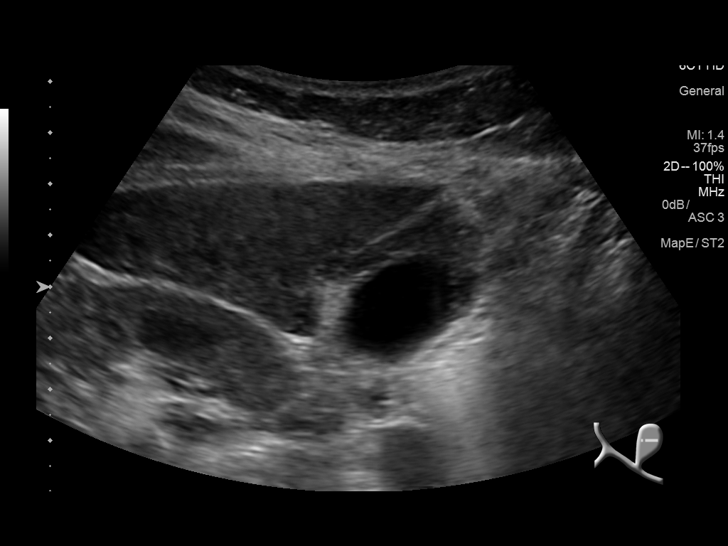
[im 21/56]
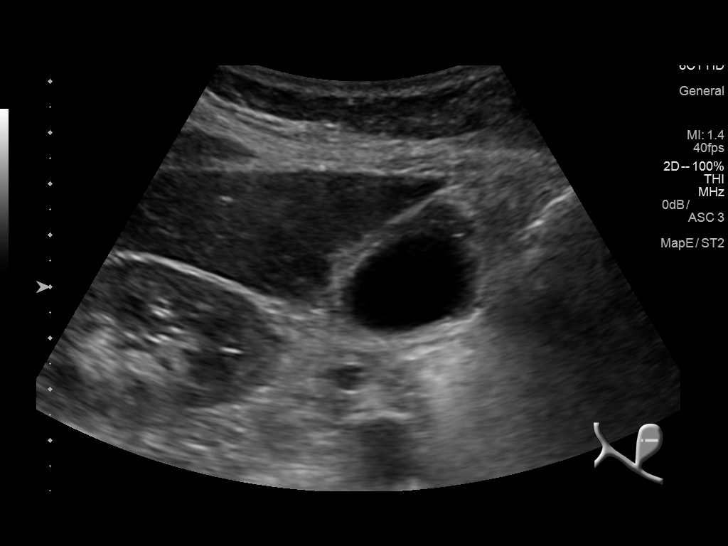
[im 26/56]
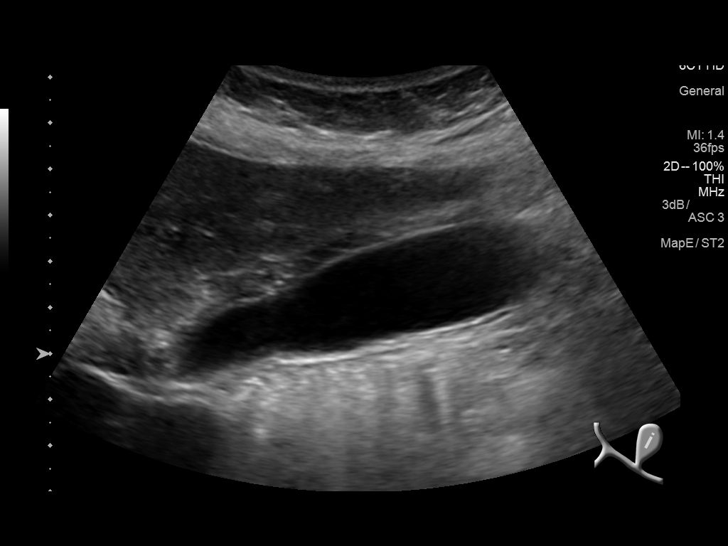
[im 30/56]
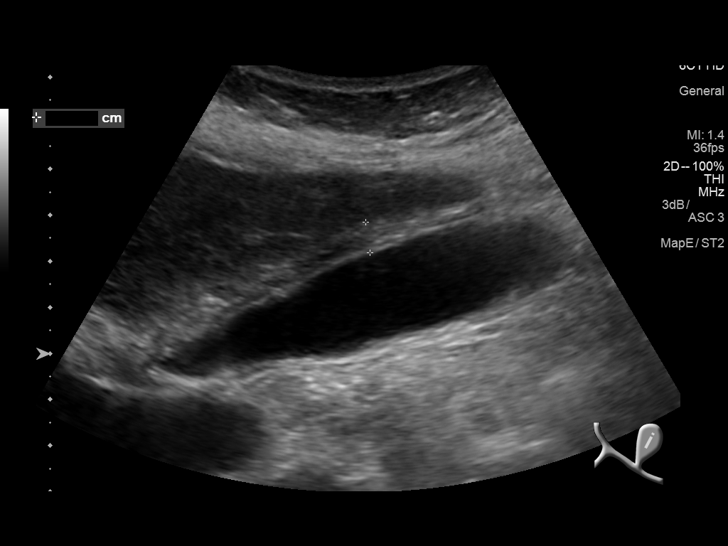
[im 35/56]
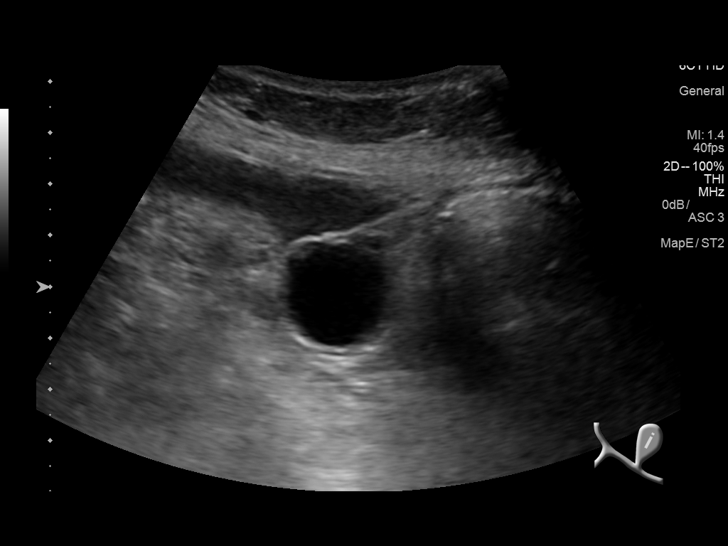
[im 37/56]
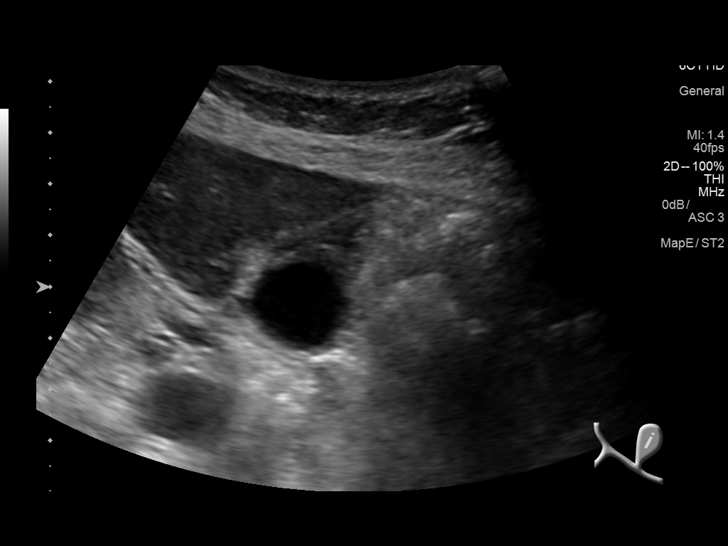
[im 42/56]
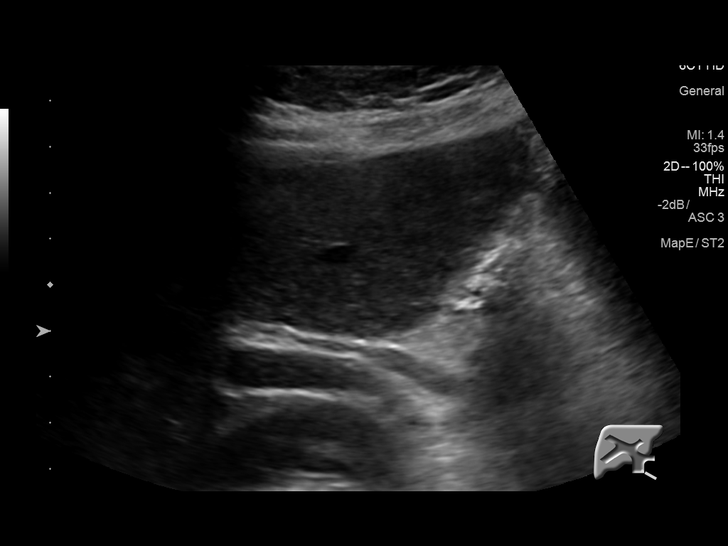
[im 46/56]
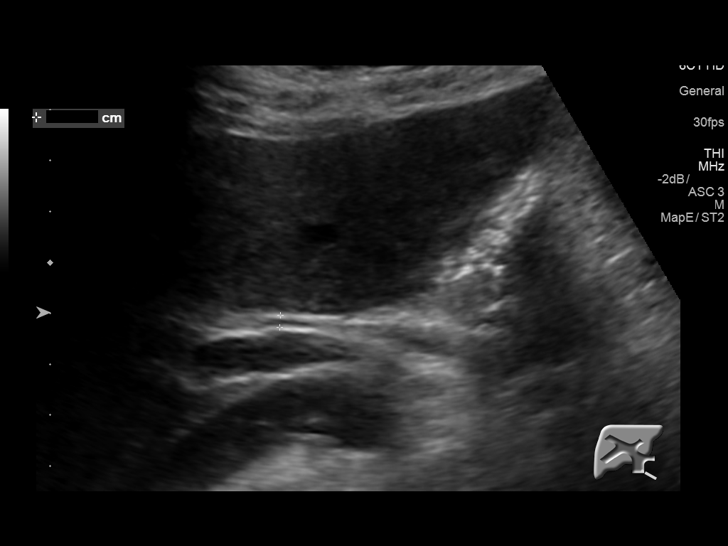
[im 51/56]
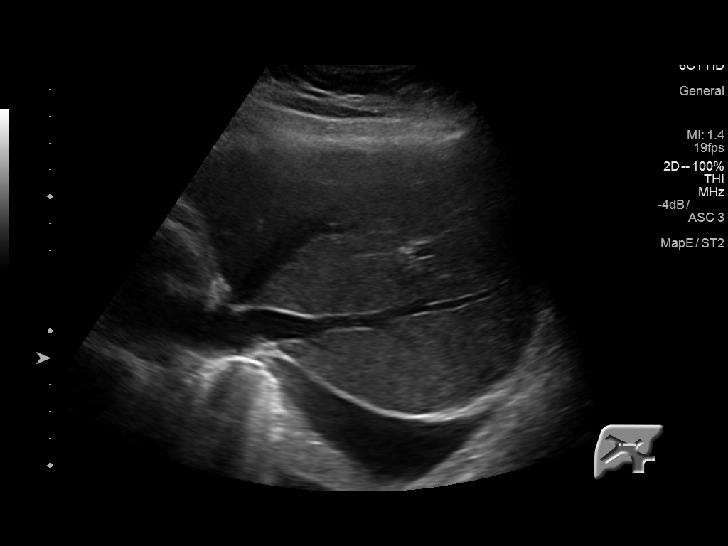
[im 56/56]
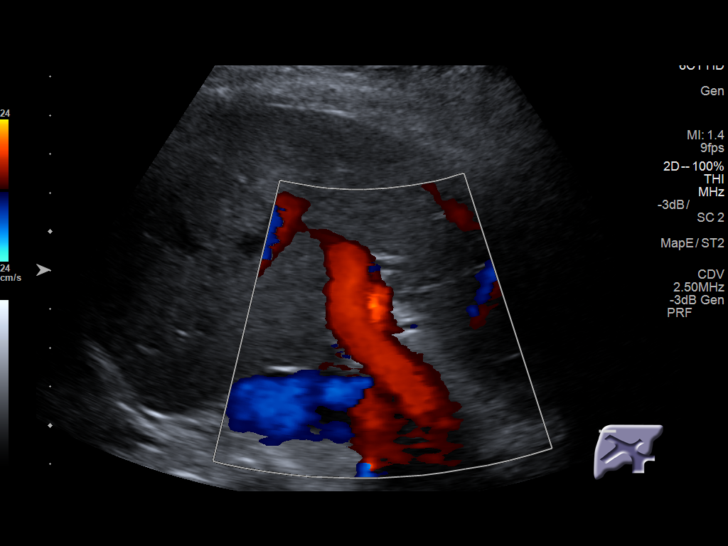

[14 of 25 positions shown; findings below may reference images not displayed]

FINDINGS: Gallbladder:

Diffuse gallbladder wall edema. The gallbladder wall measures up to
6.7 mm in thickness. Negative sonographic Murphy's sign. Negative
for stones.

Common bile duct:

Diameter: 2 mm

Liver: No focal liver abnormality. Normal parenchymal echogenicity.

Other:  Right pleural effusion.
IMPRESSION: 1. Diffuse gallbladder wall thickening. No stones. Negative
sonographic Murphy's sign.
2. Right pleural effusion.

## 2016-05-27 ENCOUNTER — Ambulatory Visit (INDEPENDENT_AMBULATORY_CARE_PROVIDER_SITE_OTHER): Payer: No Typology Code available for payment source | Admitting: Family Medicine

## 2016-05-27 VITALS — BP 130/80 | HR 117 | Temp 98.6°F | Resp 12 | Wt 121.8 lb

## 2016-05-27 DIAGNOSIS — J069 Acute upper respiratory infection, unspecified: Secondary | ICD-10-CM

## 2016-05-27 DIAGNOSIS — J029 Acute pharyngitis, unspecified: Secondary | ICD-10-CM | POA: Diagnosis not present

## 2016-05-27 LAB — POCT RAPID STREP A (OFFICE): Rapid Strep A Screen: NEGATIVE

## 2016-05-27 NOTE — Progress Notes (Signed)
   Subjective:    Patient ID: Alison Zimmerman, female    DOB: 1951/05/19, 65 y.o.   MRN: [id number]  HPI She complains of a four-day history started with cough that became slightly productive of clear sputum, slight chills, sore throat and rhinorrhea. No earache, fever. She does not smoke.   Review of Systems     Objective:   Physical Exam Alert and in no distress. Tympanic membranes and canals are normal. Pharyngeal area is normal. Neck is supple without adenopathy or thyromegaly. Cardiac exam shows a regular sinus rhythm without murmurs or gallops. Lungs are clear to auscultation. Strep screen is negative       Assessment & Plan:  Sore throat - Plan: Rapid Strep A  Acute URI  I explained that she has an upper respiratory infection and treated symptomatically. She will call if still having difficulty in 7-10 days.

## 2017-07-14 ENCOUNTER — Telehealth: Payer: Self-pay

## 2017-07-14 ENCOUNTER — Encounter: Payer: Self-pay | Admitting: Family Medicine

## 2017-07-14 ENCOUNTER — Ambulatory Visit (INDEPENDENT_AMBULATORY_CARE_PROVIDER_SITE_OTHER): Payer: No Typology Code available for payment source | Admitting: Family Medicine

## 2017-07-14 VITALS — BP 122/82 | HR 88 | Wt 128.4 lb

## 2017-07-14 DIAGNOSIS — L989 Disorder of the skin and subcutaneous tissue, unspecified: Secondary | ICD-10-CM

## 2017-07-14 DIAGNOSIS — D0511 Intraductal carcinoma in situ of right breast: Secondary | ICD-10-CM | POA: Diagnosis not present

## 2017-07-14 DIAGNOSIS — Z1211 Encounter for screening for malignant neoplasm of colon: Secondary | ICD-10-CM

## 2017-07-14 DIAGNOSIS — Z23 Encounter for immunization: Secondary | ICD-10-CM

## 2017-07-14 DIAGNOSIS — J339 Nasal polyp, unspecified: Secondary | ICD-10-CM

## 2017-07-14 NOTE — Progress Notes (Signed)
   Subjective:    Patient ID: Alison Zimmerman, female    DOB: 1950-11-16, 67 y.o.   MRN: [id number]  HPI She is here for multiple issues.  She complains of lesions present on her arms that have been there intermittently for the last year or 2.  She also complains of the polyp in the left nostril.  Review of her record indicates she does have breast cancer but has not been seen since 2017.  She opted to not have chemotherapy and has been doing alternative types of therapies for her cancer.  She also has a history of renal stones but has not had difficulty with that in quite some time.   Review of Systems     Objective:   Physical Exam Alert and in no distress.  Multiple healing round pigmented lesions are noted on her forearms.  None of them appear acute.  Nasal exam does show a polyp present in the left nares on the medial aspect of the nose.  No other lesions were noted. Throat is clear.  Neck is supple without adenopathy.  Cardiac and lung exam normal.  Abdominal exam shows no hepatosplenomegaly.       Assessment & Plan:  Flu vaccine need - Plan: Flu vaccine HIGH DOSE PF  Ductal carcinoma in situ (DCIS) of right breast - Plan: CBC with Differential/Platelet, Comprehensive metabolic panel, Ambulatory referral to Oncology  Nasal polyp - Plan: Ambulatory referral to ENT  Screening for colon cancer - Plan: Cologuard, CBC with Differential/Platelet, Comprehensive metabolic panel  Need for vaccination against Streptococcus pneumoniae - Plan: CANCELED: Pneumococcal conjugate vaccine 13-valent  Skin lesions I will refer her back to oncology for a follow-up visit to further assess her risk.  She will be referred to ENT for what appears to be a polyp to the medial aspect of the nose.  She was given a flu shot and left before we could give a pneumococcal shot. The skin lesions appear to be follicular in nature and could represent MRSA but there are no lesions present that need to be  treated.

## 2017-07-14 NOTE — Telephone Encounter (Signed)
Called pt to let her know that her appt with ent is set up for [place] the 18 of feb. Pt says she made need t change the date and was given the number. Thanks [employer]

## 2017-07-14 NOTE — Telephone Encounter (Signed)
Call pt to let her know Dr. Redmond [employer] wanted her to have the pneumococcal  Vaccine as well and if she could come back in today would be good. Thanks [employer]

## 2017-07-14 NOTE — Progress Notes (Signed)
ERROR

## 2017-07-15 ENCOUNTER — Encounter: Payer: Self-pay | Admitting: Hematology and Oncology

## 2017-07-15 LAB — CBC WITH DIFFERENTIAL/PLATELET
BASOS ABS: 0 10*3/uL (ref 0.0–0.2)
Basos: 0 %
EOS (ABSOLUTE): 0.1 10*3/uL (ref 0.0–0.4)
Eos: 1 %
Hematocrit: 40.6 % (ref 34.0–46.6)
Hemoglobin: 14.3 g/dL (ref 11.1–15.9)
Immature Grans (Abs): 0 10*3/uL (ref 0.0–0.1)
Immature Granulocytes: 0 %
LYMPHS ABS: 1.5 10*3/uL (ref 0.7–3.1)
Lymphs: 22 %
MCH: 31.3 pg (ref 26.6–33.0)
MCHC: 35.2 g/dL (ref 31.5–35.7)
MCV: 89 fL (ref 79–97)
MONOCYTES: 5 %
MONOS ABS: 0.3 10*3/uL (ref 0.1–0.9)
Neutrophils Absolute: 4.9 10*3/uL (ref 1.4–7.0)
Neutrophils: 72 %
PLATELETS: 259 10*3/uL (ref 150–379)
RBC: 4.57 x10E6/uL (ref 3.77–5.28)
RDW: 12.7 % (ref 12.3–15.4)
WBC: 6.9 10*3/uL (ref 3.4–10.8)

## 2017-07-15 LAB — COMPREHENSIVE METABOLIC PANEL
ALK PHOS: 106 IU/L (ref 39–117)
ALT: 12 IU/L (ref 0–32)
AST: 20 IU/L (ref 0–40)
Albumin/Globulin Ratio: 1.5 (ref 1.2–2.2)
Albumin: 4.6 g/dL (ref 3.6–4.8)
BUN/Creatinine Ratio: 20 (ref 12–28)
BUN: 17 mg/dL (ref 8–27)
Bilirubin Total: 0.7 mg/dL (ref 0.0–1.2)
CO2: 20 mmol/L (ref 20–29)
CREATININE: 0.87 mg/dL (ref 0.57–1.00)
Calcium: 9.5 mg/dL (ref 8.7–10.3)
Chloride: 105 mmol/L (ref 96–106)
GFR calc Af Amer: 80 mL/min/{1.73_m2} (ref >59)
GFR calc non Af Amer: 69 mL/min/{1.73_m2} (ref >59)
GLUCOSE: 95 mg/dL (ref 65–99)
Globulin, Total: 3.1 g/dL (ref 1.5–4.5)
Potassium: 4.9 mmol/L (ref 3.5–5.2)
SODIUM: 144 mmol/L (ref 134–144)
Total Protein: 7.7 g/dL (ref 6.0–8.5)

## 2017-07-27 ENCOUNTER — Other Ambulatory Visit: Payer: Self-pay | Admitting: Oncology

## 2017-07-28 ENCOUNTER — Telehealth: Payer: Self-pay

## 2017-07-28 NOTE — Telephone Encounter (Signed)
Pt says a referral was to be made for gyn appt for lesions in pelvis area. No note and no referral . Please advise Thanks [facility]

## 2017-07-28 NOTE — Telephone Encounter (Signed)
She brought up multiple things so I do not remember that one.  Get her in to see a GYN

## 2017-08-01 ENCOUNTER — Telehealth: Payer: Self-pay

## 2017-08-01 NOTE — Telephone Encounter (Signed)
LM for Alison Zimmerman that Alison Zimmerman called requesting an appointment for a gyn exam. Dr. Denman George is a Gyn Oncologist. Saw in 07-28-17 phone note  that Dr. Redmond School wanted a referral to a gyn.  Pt may be seeking out providers on her own. Pt needs a regular gyn referral.  If pt is found to have a gyn malignancy, then that gyn physician can referr to [facility].

## 2017-08-02 ENCOUNTER — Telehealth: Payer: Self-pay

## 2017-08-02 ENCOUNTER — Other Ambulatory Visit: Payer: Self-pay | Admitting: Family Medicine

## 2017-08-02 DIAGNOSIS — N898 Other specified noninflammatory disorders of vagina: Secondary | ICD-10-CM

## 2017-08-02 NOTE — Telephone Encounter (Signed)
Pt was set up for a Gyn appt with Dr. Christophe Louis at [address]. Pt apt is Thursday 08-04-17 at 11am. Pt is aware. Thanks kh

## 2017-08-02 NOTE — Telephone Encounter (Signed)
ERROR

## 2017-08-04 ENCOUNTER — Other Ambulatory Visit: Payer: Self-pay | Admitting: Obstetrics and Gynecology

## 2017-08-11 ENCOUNTER — Ambulatory Visit: Payer: Medicare Other | Admitting: Oncology

## 2017-08-17 ENCOUNTER — Inpatient Hospital Stay
Payer: No Typology Code available for payment source | Attending: Hematology and Oncology | Admitting: Hematology and Oncology

## 2017-08-17 ENCOUNTER — Ambulatory Visit: Payer: Medicare Other | Admitting: Hematology and Oncology

## 2017-08-17 DIAGNOSIS — D0511 Intraductal carcinoma in situ of right breast: Secondary | ICD-10-CM | POA: Diagnosis not present

## 2017-08-17 DIAGNOSIS — Z170 Estrogen receptor positive status [ER+]: Secondary | ICD-10-CM | POA: Diagnosis not present

## 2017-08-17 DIAGNOSIS — R921 Mammographic calcification found on diagnostic imaging of breast: Secondary | ICD-10-CM | POA: Insufficient documentation

## 2017-08-17 DIAGNOSIS — R21 Rash and other nonspecific skin eruption: Secondary | ICD-10-CM | POA: Diagnosis not present

## 2017-08-17 NOTE — Progress Notes (Signed)
[place] CONSULT NOTE  Patient Care Team: Denita Lung, MD as PCP - General (Family Medicine)  CHIEF COMPLAINTS/PURPOSE OF CONSULTATION:  Reestablishing care for previously diagnosed right breast DCIS  HISTORY OF PRESENTING ILLNESS:  Alison Zimmerman 67 y.o. female with the known history of right breast DCIS that was diagnosed in 2015.  She had 7 mm group of calcifications which on biopsy came back as high-grade DCIS that was ER PR positive.  She had a breast MRI that showed an additional group of calcifications which on biopsy were intraductal papilloma.  She was seen by Dr. Yancey Flemings and Dr. Pablo Ledger.  It appears that she was lost to follow-up since that time.  She had a follow-up mammogram in 2016 which showed increase in the calcifications and surgery was recommended.  It appears that she was not followed up with surgery either. She decided to come back and follow-up with [place] since that has been a long time since her original diagnosis.  She has not had any recent mammograms.  I reviewed her records extensively and collaborated the history with the patient.  SUMMARY OF ONCOLOGIC HISTORY:   Ductal carcinoma in situ (DCIS) of right breast   08/17/2013 Initial Diagnosis    Screening mammogram revealed a right breast calcifications 7 mm: Biopsy revealed high-grade DCIS ER 100%, PR 100%; second mass was identified on breast MRI 9 mm at 6 o'clock position biopsy revealed intraductal papilloma with UDH.      MEDICAL HISTORY:  Past Medical History:  Diagnosis Date  . Ductal carcinoma in situ of breast dx 08/17/13--  oncologist-  dr Humphrey Rolls   Stage 0 (Tis, Nx)  High Grade DCIS right breast,  ER/ PR + --  per pt on 06-10-2014  states juicing w/ fresh fruit and vegatable's for natural treatment at this time  . History of sepsis    admitted 05-16-2015  w/ urosepsis/  acute renal failure due to pyelonephritis / stone obstruction  . Migraine   . Right ureteral stone   . Wears  glasses     SURGICAL HISTORY: Past Surgical History:  Procedure Laterality Date  . BUNIONECTOMY Left 2000  . CYSTOSCOPY WITH RETROGRADE PYELOGRAM, URETEROSCOPY AND STENT PLACEMENT Right 06/16/2015   Procedure: CYSTOSCOPY WITH RIGHT RETROGRADE PYELOGRAM, URETEROSCOPY AND STENT EXCHANGE;  Surgeon: Nickie Retort, MD;  Location: [facility];  Service: Urology;  Laterality: Right;  . CYSTOSCOPY WITH STENT PLACEMENT Right 05/17/2015   Procedure: CYSTOSCOPY, RETROGRADE PYELOGRAM WITH STENT PLACEMENT;  Surgeon: Nickie Retort, MD;  Location: [facility];  Service: Urology;  Laterality: Right;  . HOLMIUM LASER APPLICATION Right 5/95/6387   Procedure: HOLMIUM LASER APPLICATION;  Surgeon: Nickie Retort, MD;  Location: [facility];  Service: Urology;  Laterality: Right;  . ORIF FEMUR FRACTURE Left age 88  . STONE EXTRACTION WITH BASKET Right 06/16/2015   Procedure: STONE EXTRACTION WITH BASKET;  Surgeon: Nickie Retort, MD;  Location: [facility];  Service: Urology;  Laterality: Right;  . TUBAL LIGATION  yrs ago    SOCIAL HISTORY: Social History   Socioeconomic History  . Marital status: Married    Spouse name: Not on file  . Number of children: Not on file  . Years of education: Not on file  . Highest education level: Not on file  Social Needs  . Financial resource strain: Not on file  . Food insecurity - worry: Not on file  . Food insecurity - inability:  Not on file  . Transportation needs - medical: Not on file  . Transportation needs - non-medical: Not on file  Occupational History  . Not on file  Tobacco Use  . Smoking status: Never Smoker  . Smokeless tobacco: Never Used  Substance and Sexual Activity  . Alcohol use: No  . Drug use: No  . Sexual activity: Not on file  Other Topics Concern  . Not on file  Social History Narrative   Married, has 2 daughters, 3 children.  Exercise - low impact water aerobics, retired, use to  work for [employer];      FAMILY HISTORY: Family History  Problem Relation Age of Onset  . Stroke Mother   . Stroke Father   . Cancer Neg Hx   . Diabetes Neg Hx   . Heart disease Neg Hx   . Hypertension Neg Hx     ALLERGIES:  is allergic to other.  MEDICATIONS:  Current Outpatient Medications  Medication Sig Dispense Refill  . Diphenhydramine-Acetaminophen (PERCOGESIC PO) Take 1 tablet by mouth as needed (takes as needed for migraines). Reported on 05/16/2015     No current facility-administered medications for this visit.     REVIEW OF SYSTEMS:   Constitutional: Denies fevers, chills or abnormal night sweats Eyes: Denies blurriness of vision, double vision or watery eyes Ears, nose, mouth, throat, and face: Denies mucositis or sore throat Respiratory: Denies cough, dyspnea or wheezes Cardiovascular: Denies palpitation, chest discomfort or lower extremity swelling Gastrointestinal:  Denies nausea, heartburn or change in bowel habits Skin: Maculopapular skin rash Lymphatics: Denies new lymphadenopathy or easy bruising Neurological:Denies numbness, tingling or new weaknesses Behavioral/Psych: Mood is stable, no new changes  Breast:  Denies any palpable lumps or discharge All other systems were reviewed with the patient and are negative.  PHYSICAL EXAMINATION: ECOG PERFORMANCE STATUS: 1 - Symptomatic but completely ambulatory  Vitals:   08/17/17 1320  BP: 140/78  Pulse: 81  Resp: 18  Temp: 98.2 F (36.8 C)  SpO2: 100%   Filed Weights   08/17/17 1320  Weight: 127 lb 14.4 oz (58 kg)    GENERAL:alert, no distress and comfortable SKIN: Maculopapular skin rash on the arms some on the chest wall since past 1 year EYES: normal, conjunctiva are pink and non-injected, sclera clear OROPHARYNX:no exudate, no erythema and lips, buccal mucosa, and tongue normal  NECK: supple, thyroid normal size, non-tender, without nodularity LYMPH:  no palpable lymphadenopathy in the cervical,  axillary or inguinal LUNGS: clear to auscultation and percussion with normal breathing effort HEART: regular rate & rhythm and no murmurs and no lower extremity edema ABDOMEN:abdomen soft, non-tender and normal bowel sounds Musculoskeletal:no cyanosis of digits and no clubbing  PSYCH: alert & oriented x 3 with fluent speech NEURO: no focal motor/sensory deficits BREAST: No palpable nodules in breast. No palpable axillary or supraclavicular lymphadenopathy (exam performed in the presence of a chaperone)   LABORATORY DATA:  I have reviewed the data as listed Lab Results  Component Value Date   WBC 6.9 07/14/2017   HGB 14.3 07/14/2017   HCT 40.6 07/14/2017   MCV 89 07/14/2017   PLT 259 07/14/2017   Lab Results  Component Value Date   NA 144 07/14/2017   K 4.9 07/14/2017   CL 105 07/14/2017   CO2 20 07/14/2017    RADIOGRAPHIC STUDIES: I have personally reviewed the radiological reports and agreed with the findings in the report.  ASSESSMENT AND PLAN:  Ductal carcinoma in situ (DCIS) of  right breast 08/17/2013:Screening mammogram revealed a right breast calcifications 7 mm: Biopsy revealed high-grade DCIS ER 100%, PR 100%; second mass was identified on breast MRI 9 mm at 6 o'clock position biopsy revealed intraductal papilloma with UDH.  Unfortunately patient did not follow-up for her subsequent appointments Last mammogram was 01/03/2015: Increasing extent of right breast calcifications now measuring 1.3 cm  Plan: 1. Repeat mammogram and ultrasound and repeat biopsy 2. Surgery evaluation 3.  Followed by adjuvant radiation  4. Followed by adjuvant tamoxifen 20 mg daily times 5 years  Maculopapular skin rash: I sent a referral to dermatology Return to clinic in 2-3 weeks to discuss the results of the mammogram ultrasound and biopsy  All questions were answered. The patient knows to call the clinic with any problems, questions or concerns.    Harriette Ohara, MD 08/17/17

## 2017-08-17 NOTE — Assessment & Plan Note (Signed)
08/17/2013:Screening mammogram revealed a right breast calcifications 7 mm: Biopsy revealed high-grade DCIS ER 100%, PR 100%; second mass was identified on breast MRI 9 mm at 6 o'clock position biopsy revealed intraductal papilloma with UDH.  Unfortunately patient did not follow-up for her subsequent appointments Last mammogram was 01/03/2015: Increasing extent of right breast calcifications now measuring 1.3 cm

## 2017-08-23 LAB — COLOGUARD: Cologuard: NEGATIVE

## 2017-08-24 ENCOUNTER — Other Ambulatory Visit: Payer: No Typology Code available for payment source

## 2017-08-25 ENCOUNTER — Encounter: Payer: Self-pay | Admitting: Family Medicine

## 2017-08-29 ENCOUNTER — Telehealth: Payer: Self-pay

## 2017-08-29 NOTE — Telephone Encounter (Signed)
Patient called and stated she was canceling her appointments with Dr. Lindi Adie. She has decided to receive alternative care.  Cyndia Bent RN

## 2017-08-31 ENCOUNTER — Ambulatory Visit: Payer: No Typology Code available for payment source | Admitting: Hematology and Oncology

## 2020-04-03 ENCOUNTER — Ambulatory Visit: Payer: No Typology Code available for payment source | Admitting: Medical

## 2020-04-04 ENCOUNTER — Ambulatory Visit: Payer: No Typology Code available for payment source | Admitting: Medical

## 2020-04-04 ENCOUNTER — Other Ambulatory Visit: Payer: Self-pay

## 2020-04-04 ENCOUNTER — Encounter: Payer: Self-pay | Admitting: Medical

## 2020-04-04 VITALS — BP 170/94 | HR 99 | Ht 62.0 in | Wt 140.4 lb

## 2020-04-04 DIAGNOSIS — R030 Elevated blood-pressure reading, without diagnosis of hypertension: Secondary | ICD-10-CM

## 2020-04-04 DIAGNOSIS — R413 Other amnesia: Secondary | ICD-10-CM

## 2020-04-04 DIAGNOSIS — D72829 Elevated white blood cell count, unspecified: Secondary | ICD-10-CM

## 2020-04-04 DIAGNOSIS — D638 Anemia in other chronic diseases classified elsewhere: Secondary | ICD-10-CM | POA: Diagnosis not present

## 2020-04-04 DIAGNOSIS — R229 Localized swelling, mass and lump, unspecified: Secondary | ICD-10-CM

## 2020-04-04 LAB — CBC WITH DIFFERENTIAL/PLATELET
Basos: 0 %
Neutrophils: 79 %

## 2020-04-04 LAB — COMPREHENSIVE METABOLIC PANEL

## 2020-04-04 MED ORDER — AMOXICILLIN 875 MG PO TABS: 875.0000 mg | ORAL_TABLET | Freq: Two times a day (BID) | ORAL | 0 refills | Status: DC

## 2020-04-04 NOTE — Progress Notes (Signed)
Subjective:  Alison Zimmerman is a 69 y.o. female who presents for Chief Complaint  Patient presents with  . Mass    right side of neck-got bigger this year   . Memory Loss     Here for a few concerns.  Accompanied by husband.  She has lump on right side of neck for around a year, but it has gotten bigger in recent months, a little tender.  No drainage. No redness. No fever, no change in appetite or weight  Has concerns about memory x the past year.  Sometimes will forget what she walked into a room for.  Sometimes forgets item she went to look for.  Denies forgetting where she laid her keys, no instances of leaving burners turned on the stove, or leaving water running in the bath.  No confusion driving or forgetting where she is going.   The memory change seems mild to her and husband.   No problems remembering certain names or words.  She does read regularly.  Plays checkers, enjoys her hobbies.     Has skin concern on forearms.  She does pick at forearm lesions.   Mom had lichen planus.   Not sure if this is the same.  No hx/o HTN, no prior medication for same, no chest pain, no SOB, no edema.   No other aggravating or relieving factors.    No other c/o.  Past Medical History:  Diagnosis Date  . Ductal carcinoma in situ of breast dx 08/17/13--  oncologist-  dr Humphrey Rolls   Stage 0 (Tis, Nx)  High Grade DCIS right breast,  ER/ PR + --  per pt on 06-10-2014  states juicing w/ fresh fruit and vegatable's for natural treatment at this time  . History of sepsis    admitted 05-16-2015  w/ urosepsis/  acute renal failure due to pyelonephritis / stone obstruction  . Migraine   . Right ureteral stone   . Wears glasses    Current Outpatient Medications on File Prior to Visit  Medication Sig Dispense Refill  . Diphenhydramine-Acetaminophen (PERCOGESIC PO) Take 1 tablet by mouth as needed (takes as needed for migraines). Reported on 05/16/2015     No current facility-administered medications on  file prior to visit.    The following portions of the patient's history were reviewed and updated as appropriate: allergies, current medications, past family history, past medical history, past social history, past surgical history and problem list.  ROS Otherwise as in subjective above     Objective: BP (!) 170/94   Pulse 99   Ht 5\' 2"  (1.575 m)   Wt 140 lb 6.4 oz (63.7 kg)   SpO2 98%   BMI 25.68 kg/m   General appearance: alert, no distress, well developed, well nourished Right neck lateral with subcutaneous mass approx 4cm x 3cm, possible entry central pore, no fluctuance, no warmth, no erythema but is tender to touch, somewhat mobile HEENT: normocephalic, sclerae anicteric, conjunctiva pink and moist, TMs pearly, nares patent, no discharge or erythema, pharynx normal Oral cavity: MMM, no lesions Neck: supple, no lymphadenopathy, no thyromegaly, no masses Heart: RRR, normal S1, S2, no murmurs Lungs: CTA bilaterally, no wheezes, rhonchi, or rales Abdomen: +bs, soft, non tender, non distended, no masses, no hepatomegaly, no splenomegaly Pulses: 2+ radial pulses, 2+ pedal pulses, normal cap refill Ext: no edema Neuro: cn2-12 intact, nonfocal exam   MRI brain 05/21/15: IMPRESSION: 1. No acute or focal intracranial abnormality to explain the patient's headaches. 2. Mild  periventricular T2 changes are slightly greater than expected for age. The finding is nonspecific but can be seen in the setting of chronic microvascular ischemia, a demyelinating process such as multiple sclerosis, vasculitis, complicated migraine headaches, or as the sequelae of a prior infectious or inflammatory process. 3. Mild mucosal thickening in the anterior ethmoid air cells bilaterally and along the floor of the maxillary sinuses, left greater than right.     Assessment: Encounter Diagnoses  Name Primary?  . Memory change   . Elevated blood-pressure reading without diagnosis of  hypertension Yes  . Lump of skin   . Anemia of chronic disease   . Leukocytosis, unspecified type      Plan: Memory change - discussed concerns, MMSE reviewed.   Labs today as below  Elevated BP - discussed findings today.   Recheck 2wk pending labs  Lump of skin - likely cyst, less likely mass or lymph node.  Begin amoxicillin, warm compresses , and recheck in 2 weeks.  Hx/o anemia and leukocytosis - labs today  Alison Zimmerman was seen today for mass and memory loss.  Diagnoses and all orders for this visit:  Elevated blood-pressure reading without diagnosis of hypertension -     Comprehensive metabolic panel -     RPR -     HIV Antibody (routine testing w rflx) -     CBC with Differential/Platelet -     TSH -     Vitamin B12  Memory change -     Comprehensive metabolic panel -     RPR -     HIV Antibody (routine testing w rflx) -     CBC with Differential/Platelet -     TSH -     Vitamin B12  Lump of skin -     CBC with Differential/Platelet  Anemia of chronic disease  Leukocytosis, unspecified type  Other orders -     amoxicillin (AMOXIL) 875 MG tablet; Take 1 tablet (875 mg total) by mouth 2 (two) times daily.    Follow up: 2 wk

## 2020-04-05 LAB — COMPREHENSIVE METABOLIC PANEL
AST: 37 IU/L (ref 0–40)
Albumin/Globulin Ratio: 1.6 (ref 1.2–2.2)
Albumin: 4.7 g/dL (ref 3.8–4.8)
Alkaline Phosphatase: 122 IU/L — ABNORMAL HIGH (ref 44–121)
BUN: 17 mg/dL (ref 8–27)
Bilirubin Total: 0.5 mg/dL (ref 0.0–1.2)
CO2: 24 mmol/L (ref 20–29)
Calcium: 9.8 mg/dL (ref 8.7–10.3)
Chloride: 104 mmol/L (ref 96–106)
Creatinine, Ser: 0.82 mg/dL (ref 0.57–1.00)
GFR calc Af Amer: 84 mL/min/{1.73_m2} (ref >59)
GFR calc non Af Amer: 73 mL/min/{1.73_m2} (ref >59)
Globulin, Total: 2.9 g/dL (ref 1.5–4.5)
Glucose: 110 mg/dL — ABNORMAL HIGH (ref 65–99)
Sodium: 142 mmol/L (ref 134–144)

## 2020-04-05 LAB — CBC WITH DIFFERENTIAL/PLATELET
Basophils Absolute: 0 10*3/uL (ref 0.0–0.2)
EOS (ABSOLUTE): 0.1 10*3/uL (ref 0.0–0.4)
Eos: 1 %
Hematocrit: 41.9 % (ref 34.0–46.6)
Hemoglobin: 14.1 g/dL (ref 11.1–15.9)
Immature Grans (Abs): 0 10*3/uL (ref 0.0–0.1)
Immature Granulocytes: 0 %
Lymphocytes Absolute: 1.4 10*3/uL (ref 0.7–3.1)
Lymphs: 15 %
MCH: 29.9 pg (ref 26.6–33.0)
MCHC: 33.7 g/dL (ref 31.5–35.7)
MCV: 89 fL (ref 79–97)
Monocytes Absolute: 0.5 10*3/uL (ref 0.1–0.9)
Monocytes: 5 %
Neutrophils Absolute: 7.1 10*3/uL — ABNORMAL HIGH (ref 1.4–7.0)
Platelets: 228 10*3/uL (ref 150–450)
RBC: 4.71 x10E6/uL (ref 3.77–5.28)
RDW: 12.3 % (ref 11.7–15.4)
WBC: 9.1 10*3/uL (ref 3.4–10.8)

## 2020-04-05 LAB — TSH: TSH: 0.535 u[IU]/mL (ref 0.450–4.500)

## 2020-04-05 LAB — VITAMIN B12: Vitamin B-12: 752 pg/mL (ref 232–1245)

## 2020-04-05 LAB — RPR: RPR Ser Ql: NONREACTIVE

## 2020-04-05 LAB — HIV ANTIBODY (ROUTINE TESTING W REFLEX): HIV Screen 4th Generation wRfx: NONREACTIVE

## 2020-04-08 ENCOUNTER — Telehealth: Payer: Self-pay | Admitting: Family Medicine

## 2020-04-08 NOTE — Telephone Encounter (Signed)
Called pt husband, reached voice mail, lmtrc.  Dr. Redmond School does want to see her again.  She was given antibiotic to see if this would resolve the cyst.  However, if they do not want to wait to see if this will resolve, Dr. Redmond School can see her tomorrow.

## 2020-04-08 NOTE — Telephone Encounter (Signed)
Alison Zimmerman called I explained you wanted to see pt.  I asked did they want to wait and see if the antibiotic was going to help and he said no.  He said he will humor you and come in the morning.  And that it was useless to see [name] then and I said no, that other things were accomplished when they saw [name].

## 2020-04-09 ENCOUNTER — Other Ambulatory Visit: Payer: Self-pay

## 2020-04-09 ENCOUNTER — Ambulatory Visit (INDEPENDENT_AMBULATORY_CARE_PROVIDER_SITE_OTHER): Payer: No Typology Code available for payment source | Admitting: Family Medicine

## 2020-04-09 ENCOUNTER — Encounter: Payer: Self-pay | Admitting: Family Medicine

## 2020-04-09 VITALS — BP 150/94 | HR 84 | Temp 97.6°F | Wt 140.0 lb

## 2020-04-09 DIAGNOSIS — L089 Local infection of the skin and subcutaneous tissue, unspecified: Secondary | ICD-10-CM | POA: Diagnosis not present

## 2020-04-09 DIAGNOSIS — L723 Sebaceous cyst: Secondary | ICD-10-CM

## 2020-04-09 MED ORDER — LIDOCAINE HCL 1 % IJ SOLN: 10.0000 mL | Freq: Once | INTRAMUSCULAR | Status: DC

## 2020-04-09 NOTE — Progress Notes (Signed)
   Subjective:    Patient ID: Alison Zimmerman, female    DOB: 02/02/1951, 69 y.o.   MRN: [id number]  HPI She is here for reevaluation of the lesion on the right neck area.  Apparently she is did stick a needle in that and since then she has noted increase in pain and swelling.   Review of Systems     Objective:   Physical Exam A 3 cm red tender lesion with a central dimple is noted on the right lateral neck area.       Assessment & Plan:  Infected sebaceous cyst I discussed the lesion and care of it with her and her husband.  The lesion was injected with Xylocaine and epinephrine.  A 1 and half centimeter incision was made.  Purulent material as well as cyst sac was expressed.  Wound was then cleaned with no evidence of any cyst sac present.  It was packed with iodoform.  They are to remove the packing or return here in 2 days for packing removal.

## 2020-04-09 NOTE — Addendum Note (Signed)
Addended by: Elyse Jarvis on: 04/09/2020 11:25 AM   Modules accepted: Orders

## 2020-04-14 ENCOUNTER — Encounter: Payer: Self-pay | Admitting: Medical

## 2020-04-14 ENCOUNTER — Ambulatory Visit (INDEPENDENT_AMBULATORY_CARE_PROVIDER_SITE_OTHER): Payer: No Typology Code available for payment source | Admitting: Medical

## 2020-04-14 ENCOUNTER — Other Ambulatory Visit: Payer: Self-pay

## 2020-04-14 VITALS — BP 156/88 | HR 96 | Ht 62.0 in | Wt 140.0 lb

## 2020-04-14 DIAGNOSIS — Z5189 Encounter for other specified aftercare: Secondary | ICD-10-CM

## 2020-04-14 DIAGNOSIS — R208 Other disturbances of skin sensation: Secondary | ICD-10-CM

## 2020-04-14 DIAGNOSIS — Z4800 Encounter for change or removal of nonsurgical wound dressing: Secondary | ICD-10-CM

## 2020-04-14 MED ORDER — AMOXICILLIN 500 MG PO TABS: 500.0000 mg | ORAL_TABLET | Freq: Three times a day (TID) | ORAL | 0 refills | Status: DC

## 2020-04-14 NOTE — Progress Notes (Signed)
Subjective:  Alison Zimmerman is a 69 y.o. female who presents for Chief Complaint  Patient presents with  . Follow-up    remove packing      Here with husband for packing removal right neck abscess.  Her last few days has continued to have some discomfort.  No fever no body aches or chills, no nausea vomiting.  No other aggravating or relieving factors.    No other c/o.  The following portions of the patient's history were reviewed and updated as appropriate: allergies, current medications, past family history, past medical history, past social history, past surgical history and problem list.  ROS Otherwise as in subjective above  Objective: BP (!) 156/88   Pulse 96   Ht 5\' 2"  (1.575 m)   Wt 140 lb (63.5 kg)   SpO2 96%   BMI 25.61 kg/m   General appearance: alert, no distress, well developed, well nourished Right neck laterally with raised tender area with packing in place from I&D last week   Assessment: Encounter Diagnoses  Name Primary?  Marland Kitchen Wound check, abscess Yes  . Skin tenderness   . Abscess packing removal      Plan: Cleaned and prepped right neck.  Removed packing.  Placed new packing quarter inch under sterile procedure.  She will return in 3 days for packing removal.  Continue amoxicillin, can use over-the-counter Tylenol for pain as needed.  Discussed dressing changes.  Discussed heat therapy.  Wound is improving  Dusty was seen today for follow-up.  Diagnoses and all orders for this visit:  Wound check, abscess  Skin tenderness  Abscess packing removal  Other orders -     amoxicillin (AMOXIL) 500 MG tablet; Take 1 tablet (500 mg total) by mouth in the morning, at noon, and at bedtime.    Follow up: 3 days for wound packing removal

## 2020-04-16 ENCOUNTER — Encounter: Payer: Self-pay | Admitting: Medical

## 2020-04-16 ENCOUNTER — Other Ambulatory Visit: Payer: Self-pay

## 2020-04-16 ENCOUNTER — Ambulatory Visit: Payer: No Typology Code available for payment source | Admitting: Medical

## 2020-04-16 VITALS — BP 132/84 | HR 95 | Temp 98.9°F | Wt 141.0 lb

## 2020-04-16 DIAGNOSIS — Z5189 Encounter for other specified aftercare: Secondary | ICD-10-CM

## 2020-04-16 NOTE — Progress Notes (Signed)
Subjective:  Alison Zimmerman is a 69 y.o. female who presents for Chief Complaint  Patient presents with   other    remove packing     Here with husband for packing removal right neck abscess.  Her last few days has felt improvement.  No fever no body aches or chills, no nausea vomiting.  No other aggravating or relieving factors.    No other c/o.  The following portions of the patient's history were reviewed and updated as appropriate: allergies, current medications, past family history, past medical history, past social history, past surgical history and problem list.  ROS Otherwise as in subjective above  Objective: BP 132/84    Pulse 95    Temp 98.9 F (37.2 C)    Wt 141 lb (64 kg)    LMP  (LMP Unknown)    BMI 25.79 kg/m   General appearance: alert, no distress, well developed, well nourished Right neck laterally with raised tender area with packing in place from I&D last week   Assessment: Encounter Diagnosis  Name Primary?   Wound check, abscess Yes     Plan: Cleaned and prepped right neck.  Removed packing.  Placed new bandage.  Continue amoxicillin, can use over-the-counter Tylenol for pain as needed.  Discussed dressing changes.  Discussed heat therapy.  Wound is improving.  Discussed usual timeframe to see resolution of symptoms.  Recheck if not improving or not resolving as discussed  Elverta was seen today for other.  Diagnoses and all orders for this visit:  Wound check, abscess    Follow up: prn

## 2020-07-02 ENCOUNTER — Other Ambulatory Visit: Payer: Self-pay

## 2020-07-02 ENCOUNTER — Encounter: Payer: Self-pay | Admitting: Medical

## 2020-07-02 ENCOUNTER — Telehealth: Payer: Self-pay | Admitting: Family Medicine

## 2020-07-02 ENCOUNTER — Telehealth: Payer: No Typology Code available for payment source | Admitting: Medical

## 2020-07-02 VITALS — BP 174/88 | HR 91 | Ht 62.5 in | Wt 142.6 lb

## 2020-07-02 DIAGNOSIS — K929 Disease of digestive system, unspecified: Secondary | ICD-10-CM

## 2020-07-02 DIAGNOSIS — R195 Other fecal abnormalities: Secondary | ICD-10-CM | POA: Diagnosis not present

## 2020-07-02 DIAGNOSIS — R053 Chronic cough: Secondary | ICD-10-CM

## 2020-07-02 DIAGNOSIS — K921 Melena: Secondary | ICD-10-CM | POA: Insufficient documentation

## 2020-07-02 DIAGNOSIS — D649 Anemia, unspecified: Secondary | ICD-10-CM

## 2020-07-02 DIAGNOSIS — R030 Elevated blood-pressure reading, without diagnosis of hypertension: Secondary | ICD-10-CM

## 2020-07-02 LAB — HEMOCCULT GUIAC POC 1CARD (OFFICE): Fecal Occult Blood, POC: NEGATIVE

## 2020-07-02 LAB — POCT HEMOGLOBIN: Hemoglobin: 10.9 g/dL — AB (ref 11–14.6)

## 2020-07-02 NOTE — Telephone Encounter (Signed)
Alison Zimmerman please call patient and try to go back over this with her  She was seen yesterday for black stool which was likely due to Pepto-Bismol.  I did tell her if any additional dark stool in the coming days she needs to let us know  Black stool can signify blood in the stool coming from the gastric tract. I want her to take stool cards and check a few more time just to make sure and send that in so we can test that  Moreover, her hemoglobin had dropped since November which could suggest some new anemia which would be a problem.  The reason I asked her to come back in a week is to confirm whether her hemoglobin is stable or dropping even more  Another issue was on 2 visits in the last 3 months her blood pressure has been elevated.  If she has a way to check this at home then great, if not then she should have it rechecked in a week to confirm whether it is staying elevated or not  So my goal is not to have her come in a bunch of  times just for the sake of coming in but to make sure we address the issues and not ignore something that could be a problem

## 2020-07-02 NOTE — Patient Instructions (Signed)
Encounter Diagnoses  Name Primary?  . Black stool Yes  . Loose stools   . Chronic cough   . Elevated blood-pressure reading without diagnosis of hypertension    Black stool You had one recent episode of dark stool.  I suspect this is related to Pepto-Bismol.  However if you get any new similar dark stool in the next week please call us back.  Anytime we see black  stool there is a concern for bleeding in the colon or GI tract.  You have no good reason for any recent blood loss but she did use Pepto-Bismol which can cause dark stool.  Your hemoglobin was lower than it was back in November.  Since there was a change in your hemoglobin, I would recommend a repeat hemoglobin in a week.  If this still is dropping, we will need to do other evaluation  We are sending you home with stool cards today to turn in to check for additional screening for blood in the stool   Elevated blood pressure Your blood pressure is elevated today and was elevated on some of the recent visits.  It is not clear whether you have a blood pressure problem or not just yet.  I recommend you monitor your blood pressure either at home if you have a cuff, at the pharmacy, or you can swing back by here for blood pressure check within the next week or 2.  Normal blood pressure is 120/70.  You have 2 recent visits where pressure is elevated quite a bit.   Chronic cough Since you still have a lingering cough for over a month, lets go for chest x-ray to make sure no unusual finding  Please go to [facility] for your chest xray.   Their hours are 8am - 4:30 pm Monday - Friday.  Take your insurance card with you.  [facility] [phone]  [place] [address], [place], [place] [id number]  [address] [place], [place] [address]

## 2020-07-02 NOTE — Telephone Encounter (Signed)
Pt checked out was going to schedule her for a week fu and pt said she did not need a recheck BP,  Or on her hemoglobin. States she has never had a problem with her bp, so she did not need to come back next week, just wanted to give you a [place]

## 2020-07-02 NOTE — Progress Notes (Signed)
Subjective:     Patient ID: Alison Zimmerman, female   DOB: 1950/10/17, 70 y.o.   MRN: [id number]  HPI Chief Complaint  Patient presents with  . Cough  . Diarrhea    Symptoms started x1 week    Here for concern of dark stool.  She reports black diarrhea on Monday only, once.  Since Monday has had normal stools.  No additional black or diarrhea stools.   No recent beets, but she has eaten some blueberries recently.  No blood visible in the stool.  She used pepto bismol over the weekend for some belly upset but that was out of the blue.   No current abdominal pain.  No nausea or vomiting.  No recent use of NSAIDs.  No recent alcohol.  No fever, no night sweats, no weight loss.  On screening today she mentioned cough, but she notes cough has been going on occasionally for the last month or 2.  She notes that this started back in November when she had an illness but those symptoms resolved, only has this lingering cough now.  No current URI symptoms.  No other aggravating or relieving factors. No other complaint.  Past Medical History:  Diagnosis Date  . Ductal carcinoma in situ of breast dx 08/17/13--  oncologist-  dr Humphrey Rolls   Stage 0 ([place], Nx)  High Grade DCIS right breast,  ER/ PR + --  per pt on 06-10-2014  states juicing w/ fresh fruit and vegatable's for natural treatment at this time  . History of sepsis    admitted 05-16-2015  w/ urosepsis/  acute renal failure due to pyelonephritis / stone obstruction  . Migraine   . Right ureteral stone   . Wears glasses    No current outpatient medications on file prior to visit.   No current facility-administered medications on file prior to visit.    Review of Systems As in subjective    Objective:   Physical Exam  BP (!) 174/88   Pulse 91   Ht 5' 2.5" (1.588 m)   Wt 142 lb 9.6 oz (64.7 kg)   LMP  (LMP Unknown)   SpO2 98%   BMI 25.67 kg/m   Wt Readings from Last 3 Encounters:  07/02/20 142 lb 9.6 oz (64.7 kg)  04/16/20 141 lb  (64 kg)  04/14/20 140 lb (63.5 kg)   BP Readings from Last 3 Encounters:  07/02/20 (!) 174/88  04/16/20 132/84  04/14/20 (!) 156/88     General appearence: alert, no distress, WD/WN, pleasant African American female HEENT: normocephalic, sclerae anicteric, TMs pearly, nares patent, no discharge or erythema, pharynx normal Oral cavity: MMM, no lesions Neck: supple, no lymphadenopathy, no thyromegaly, no masses Heart: RRR, normal S1, S2, no murmurs Lungs: CTA bilaterally, no wheezes, rhonchi, or rales Abdomen: +bs, soft, non tender, non distended, no masses, no hepatomegaly, no splenomegaly Pulses: 2+ symmetric, upper and lower extremities, normal cap refill DRE: no external abnormality, rectal with normal tone, negative occult blood, exam chaperoned by nurse      Assessment:     Encounter Diagnoses  Name Primary?  . Black stool Yes  . Loose stools   . Chronic cough   . Elevated blood-pressure reading without diagnosis of hypertension        Plan:     Guaiac negative, hemoglobin within normal limits.  Advised that the dark stool single episode was likely due to her Pepto-Bismol use.  Advise if any additional symptoms similar to let  us know.  Of note 2019 Cologuard was negative.  Nevertheless she had a drop in hemoglobin compared to November.  I know sometimes her point-of-care hemoglobin test may be a little different than the lab finding.  Lingering cough/chronic cough since late in the year 2021.  We will send for chest x-ray   We discussed the following recommendations which also gave as a handout:  Black stool You had one recent episode of dark stool.  I suspect this is related to Pepto-Bismol.  However if you get any new similar dark stool in the next week please call us back.  Anytime we see black  stool there is a concern for bleeding in the colon or GI tract.  You have no good reason for any recent blood loss but she did use Pepto-Bismol which can cause dark stool.   Your hemoglobin was lower than it was back in November.  Since there was a change in your hemoglobin, I would recommend a repeat hemoglobin in a week.  If this still is dropping, we will need to do other evaluation  We are sending you home with stool cards today to turn in to check for additional screening for blood in the stool   Elevated blood pressure Your blood pressure is elevated today and was elevated on some of the recent visits.  It is not clear whether you have a blood pressure problem or not just yet.  I recommend you monitor your blood pressure either at home if you have a cuff, at the pharmacy, or you can swing back by here for blood pressure check within the next week or 2.  Normal blood pressure is 120/70.  You have 2 recent visits where pressure is elevated quite a bit.   Chronic cough Since you still have a lingering cough for over a month, lets go for chest x-ray to make sure no unusual finding  Please go to [facility] for your chest xray.   Their hours are 8am - 4:30 pm Monday - Friday.  Take your insurance card with you.  [facility] [phone]  [address], [place], [place] [address]  [address], [place] [address]    Quaneisha was seen today for cough and diarrhea.  Diagnoses and all orders for this visit:  Black stool -     Hemoglobin -     Hemoccult - 1 Card (office)  Loose stools -     Hemoglobin -     Hemoccult - 1 Card (office)  Chronic cough -     DG Chest 2 View; Future  Elevated blood-pressure reading without diagnosis of hypertension  f/u pending chest xray

## 2020-07-03 NOTE — Telephone Encounter (Signed)
Advise pt that we do need to see her in about a week and she needs to complete stool cards and mail them bak in. Pt advised she dose not know where they are and if she can not find them she will come by and get some more. [place]

## 2020-07-11 ENCOUNTER — Ambulatory Visit: Payer: No Typology Code available for payment source | Admitting: Medical

## 2020-07-11 ENCOUNTER — Other Ambulatory Visit: Payer: Self-pay

## 2020-07-11 ENCOUNTER — Encounter: Payer: Self-pay | Admitting: Medical

## 2020-07-11 VITALS — BP 155/84 | HR 94 | Ht 63.0 in | Wt 141.2 lb

## 2020-07-11 DIAGNOSIS — F4329 Adjustment disorder with other symptoms: Secondary | ICD-10-CM | POA: Diagnosis not present

## 2020-07-11 DIAGNOSIS — D649 Anemia, unspecified: Secondary | ICD-10-CM | POA: Diagnosis not present

## 2020-07-11 DIAGNOSIS — R059 Cough, unspecified: Secondary | ICD-10-CM | POA: Diagnosis not present

## 2020-07-11 DIAGNOSIS — R030 Elevated blood-pressure reading, without diagnosis of hypertension: Secondary | ICD-10-CM | POA: Diagnosis not present

## 2020-07-11 LAB — CBC WITH DIFFERENTIAL/PLATELET
Basophils Absolute: 0 10*3/uL (ref 0.0–0.2)
Basos: 0 %
EOS (ABSOLUTE): 0 10*3/uL (ref 0.0–0.4)
Eos: 0 %
Hematocrit: 43.3 % (ref 34.0–46.6)
Hemoglobin: 14.3 g/dL (ref 11.1–15.9)
Immature Grans (Abs): 0 10*3/uL (ref 0.0–0.1)
Immature Granulocytes: 0 %
Lymphocytes Absolute: 2.1 10*3/uL (ref 0.7–3.1)
Lymphs: 21 %
MCH: 29.3 pg (ref 26.6–33.0)
MCHC: 33 g/dL (ref 31.5–35.7)
MCV: 89 fL (ref 79–97)
Monocytes Absolute: 0.6 10*3/uL (ref 0.1–0.9)
Monocytes: 6 %
Neutrophils Absolute: 7.5 10*3/uL — ABNORMAL HIGH (ref 1.4–7.0)
Neutrophils: 73 %
Platelets: 242 10*3/uL (ref 150–450)
RBC: 4.88 x10E6/uL (ref 3.77–5.28)
RDW: 12.4 % (ref 11.7–15.4)
WBC: 10.3 10*3/uL (ref 3.4–10.8)

## 2020-07-11 NOTE — Progress Notes (Signed)
Subjective:     Patient ID: Alison Zimmerman, female   DOB: [birth year], 70 y.o.   MRN: [id number]  HPI Chief Complaint  Patient presents with  . Follow-up    Iron levels    Here for 1 week f/u.  Last week she came in for black stool x 1 episode.  She has had no other black stool, bleeding or bruising.  She is here to f/u on anemia and BP.  She denies hx/o hypertension, chest pain, edema, palpitations.   Regarding elevated BPs on recent visits she attributes this to stress.  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   Her daughter was even up from [place] last week to help her with this.     She feels fine today.  She does snore but no witnessed apnea.  Here to recheck hemoglobin  No other aggravating or relieving factors. No other complaint.    Past Medical History:  Diagnosis Date  . Ductal carcinoma in situ of breast dx 08/17/13--  oncologist-  dr Humphrey Rolls   Stage 0 ([place], Nx)  High Grade DCIS right breast,  ER/ PR + --  per pt on 06-10-2014  states juicing w/ fresh fruit and vegatable's for natural treatment at this time  . History of sepsis    admitted 05-16-2015  w/ urosepsis/  acute renal failure due to pyelonephritis / stone obstruction  . Migraine   . Right ureteral stone   . Wears glasses    No current outpatient medications on file prior to visit.   No current facility-administered medications on file prior to visit.    Review of Systems As in subjective    Objective:   Physical Exam  BP (!) 155/84   Pulse 94   Ht 5\' 3"  (1.6 m)   Wt 141 lb 3.2 oz (64 kg)   LMP  (LMP Unknown)   SpO2 98%   BMI 25.01 kg/m   Wt Readings from Last 3 Encounters:  07/11/20 141 lb 3.2 oz (64 kg)  07/02/20 142 lb 9.6 oz (64.7 kg)  04/16/20 141 lb (64 kg)   BP Readings from Last 3 Encounters:  07/11/20 (!) 155/84  07/02/20 (!) 174/88  04/16/20 132/84     General appearence: alert, no distress, WD/WN, pleasant African American female Neck:  supple, no lymphadenopathy, no thyromegaly, no masses Heart: RRR, normal S1, S2, no murmurs Lungs: CTA bilaterally, no wheezes, rhonchi, or rales Pulses: 2+ symmetric, upper and lower extremities, normal cap refill DRE: no external abnormality, rectal with normal tone, negative occult blood, exam chaperoned by nurse      Assessment:     Encounter Diagnoses  Name Primary?  . Elevated blood-pressure reading without diagnosis of hypertension Yes  . Anemia, unspecified type   . Cough   . Stress and adjustment reaction        Plan:     Elevated BP - discussed recent BP readings in chart record.  discussed risks of hypertension.  She declines medication today although I recommend we initiate BP medication.   She was then advised to get a cuff at pharmacy and start monitoring home readings.   Anemia - normal readings in fall 2021, but low POCT hemoglobin here last week.  Occult card negative.  No worrisome symptoms currently.  Repeat CBC today  Cough - resolved  Stress reaction - discussed her recent stress and expressed my empathy for her situation handling mother's estate.   We discussed  the following handout:  Patient Instructions   Recommendations:  Your blood pressures have been elevated on a few occasions in the last few months  You attribute this to stress.    I recommend you get a cuff and start checking home readings a few days per week.  The goal is 120/70.    Borderline is 130/80.  We want to stay either at goal or less than 130/80.    Anything over 140/90 is high.    Limit salt, fast food, junk food, try not to eat meat every meal as this contributes to high blood pressure and heart disease.  Reduce stress when possible.   If you have loud snoring, or if you don't awake rested, or if you get sleepy in the day, you may want to pursue sleep study to rule out sleep apnea . Sleep apnea can cause high blood pressure.     Please call back in the next month and let  us know what your home blood pressure readings look like.  Often people get white coat hypertension or elevated BP when they come into the office .  So since you don't agree to medication today, lets give you the benefit of the doubt and start checking home readings.        Hypertension, Adult Hypertension is another name for high blood pressure. High blood pressure forces your heart to work harder to pump blood. This can cause problems over time. There are two numbers in a blood pressure reading. There is a top number (systolic) over a bottom number (diastolic). It is best to have a blood pressure that is below 120/80. Healthy choices can help lower your blood pressure, or you may need medicine to help lower it. What are the causes? The cause of this condition is not known. Some conditions may be related to high blood pressure. What increases the risk?  Smoking.  Having type 2 diabetes mellitus, high cholesterol, or both.  Not getting enough exercise or physical activity.  Being overweight.  Having too much fat, sugar, calories, or salt (sodium) in your diet.  Drinking too much alcohol.  Having long-term (chronic) kidney disease.  Having a family history of high blood pressure.  Age. Risk increases with age.  Race. You may be at higher risk if you are African American.  Gender. Men are at higher risk than women before age 85. After age 64, women are at higher risk than men.  Having obstructive sleep apnea.  Stress. What are the signs or symptoms?  High blood pressure may not cause symptoms. Very high blood pressure (hypertensive crisis) may cause: ? Headache. ? Feelings of worry or nervousness (anxiety). ? Shortness of breath. ? Nosebleed. ? A feeling of being sick to your stomach (nausea). ? Throwing up (vomiting). ? Changes in how you see. ? Very bad chest pain. ? Seizures. How is this treated?  This condition is treated by making healthy lifestyle changes, such  as: ? Eating healthy foods. ? Exercising more. ? Drinking less alcohol.  Your health care provider may prescribe medicine if lifestyle changes are not enough to get your blood pressure under control, and if: ? Your top number is above 130. ? Your bottom number is above 80.  Your personal target blood pressure may vary. Follow these instructions at home: Eating and drinking  If told, follow the DASH eating plan. To follow this plan: ? Fill one half of your plate at each meal with fruits and vegetables. ?  Fill one fourth of your plate at each meal with whole grains. Whole grains include whole-wheat pasta, brown rice, and whole-grain bread. ? Eat or drink low-fat dairy products, such as skim milk or low-fat yogurt. ? Fill one fourth of your plate at each meal with low-fat (lean) proteins. Low-fat proteins include fish, chicken without skin, eggs, beans, and tofu. ? Avoid fatty meat, cured and processed meat, or chicken with skin. ? Avoid pre-made or processed food.  Eat less than 1,500 mg of salt each day.  Do not drink alcohol if: ? Your doctor tells you not to drink. ? You are pregnant, may be pregnant, or are planning to become pregnant.  If you drink alcohol: ? Limit how much you use to:  0-1 drink a day for women.  0-2 drinks a day for men. ? Be aware of how much alcohol is in your drink. In the [place] one drink equals one 12 oz bottle of beer (355 mL), one 5 oz glass of wine (148 mL), or one 1 oz glass of hard liquor (44 mL).   Lifestyle  Work with your doctor to stay at a healthy weight or to lose weight. Ask your doctor what the best weight is for you.  Get at least 30 minutes of exercise most days of the week. This may include walking, swimming, or biking.  Get at least 30 minutes of exercise that strengthens your muscles (resistance exercise) at least 3 days a week. This may include lifting weights or doing Pilates.  Do not use any products that contain nicotine or  tobacco, such as cigarettes, e-cigarettes, and chewing tobacco. If you need help quitting, ask your doctor.  Check your blood pressure at home as told by your doctor.  Keep all follow-up visits as told by your doctor. This is important.   Medicines  Take over-the-counter and prescription medicines only as told by your doctor. Follow directions carefully.  Do not skip doses of blood pressure medicine. The medicine does not work as well if you skip doses. Skipping doses also puts you at risk for problems.  Ask your doctor about side effects or reactions to medicines that you should watch for. Contact a doctor if you:  Think you are having a reaction to the medicine you are taking.  Have headaches that keep coming back (recurring).  Feel dizzy.  Have swelling in your ankles.  Have trouble with your vision. Get help right away if you:  Get a very bad headache.  Start to feel mixed up (confused).  Feel weak or numb.  Feel faint.  Have very bad pain in your: ? Chest. ? Belly (abdomen).  Throw up more than once.  Have trouble breathing. Summary  Hypertension is another name for high blood pressure.  High blood pressure forces your heart to work harder to pump blood.  For most people, a normal blood pressure is less than 120/80.  Making healthy choices can help lower blood pressure. If your blood pressure does not get lower with healthy choices, you may need to take medicine. This information is not intended to replace advice given to you by your health care provider. Make sure you discuss any questions you have with your health care provider. Document Revised: 01/25/2018 Document Reviewed: 01/25/2018 Elsevier Patient Education  [place] was seen today for follow-up.  Diagnoses and all orders for this visit:  Elevated blood-pressure reading without diagnosis of hypertension  Anemia, unspecified type -  CBC with  Differential/Platelet  Cough  Stress and adjustment reaction  f/u pending lab, and asked her to get [place] home BP readings in 3-4 weeks

## 2020-07-11 NOTE — Patient Instructions (Addendum)
Recommendations:  Your blood pressures have been elevated on a few occasions in the last few months  You attribute this to stress.    I recommend you get a cuff and start checking home readings a few days per week.  The goal is 120/70.    Borderline is 130/80.  We want to stay either at goal or less than 130/80.    Anything over 140/90 is high.    Limit salt, fast food, junk food, try not to eat meat every meal as this contributes to high blood pressure and heart disease.  Reduce stress when possible.   If you have loud snoring, or if you don't awake rested, or if you get sleepy in the day, you may want to pursue sleep study to rule out sleep apnea . Sleep apnea can cause high blood pressure.     Please call back in the next month and let us know what your home blood pressure readings look like.  Often people get white coat hypertension or elevated BP when they come into the office .  So since you don't agree to medication today, lets give you the benefit of the doubt and start checking home readings.        Hypertension, Adult Hypertension is another name for high blood pressure. High blood pressure forces your heart to work harder to pump blood. This can cause problems over time. There are two numbers in a blood pressure reading. There is a top number (systolic) over a bottom number (diastolic). It is best to have a blood pressure that is below 120/80. Healthy choices can help lower your blood pressure, or you may need medicine to help lower it. What are the causes? The cause of this condition is not known. Some conditions may be related to high blood pressure. What increases the risk?  Smoking.  Having type 2 diabetes mellitus, high cholesterol, or both.  Not getting enough exercise or physical activity.  Being overweight.  Having too much fat, sugar, calories, or salt (sodium) in your diet.  Drinking too much alcohol.  Having long-term (chronic) kidney  disease.  Having a family history of high blood pressure.  Age. Risk increases with age.  Race. You may be at higher risk if you are African American.  Gender. Men are at higher risk than women before age 60. After age 18, women are at higher risk than men.  Having obstructive sleep apnea.  Stress. What are the signs or symptoms?  High blood pressure may not cause symptoms. Very high blood pressure (hypertensive crisis) may cause: ? Headache. ? Feelings of worry or nervousness (anxiety). ? Shortness of breath. ? Nosebleed. ? A feeling of being sick to your stomach (nausea). ? Throwing up (vomiting). ? Changes in how you see. ? Very bad chest pain. ? Seizures. How is this treated?  This condition is treated by making healthy lifestyle changes, such as: ? Eating healthy foods. ? Exercising more. ? Drinking less alcohol.  Your health care provider may prescribe medicine if lifestyle changes are not enough to get your blood pressure under control, and if: ? Your top number is above 130. ? Your bottom number is above 80.  Your personal target blood pressure may vary. Follow these instructions at home: Eating and drinking  If told, follow the DASH eating plan. To follow this plan: ? Fill one half of your plate at each meal with fruits and vegetables. ? Fill one fourth of your plate at each  meal with whole grains. Whole grains include whole-wheat pasta, brown rice, and whole-grain bread. ? Eat or drink low-fat dairy products, such as skim milk or low-fat yogurt. ? Fill one fourth of your plate at each meal with low-fat (lean) proteins. Low-fat proteins include fish, chicken without skin, eggs, beans, and tofu. ? Avoid fatty meat, cured and processed meat, or chicken with skin. ? Avoid pre-made or processed food.  Eat less than 1,500 mg of salt each day.  Do not drink alcohol if: ? Your doctor tells you not to drink. ? You are pregnant, may be pregnant, or are planning to  become pregnant.  If you drink alcohol: ? Limit how much you use to:  0-1 drink a day for women.  0-2 drinks a day for men. ? Be aware of how much alcohol is in your drink. In the [place] one drink equals one 12 oz bottle of beer (355 mL), one 5 oz glass of wine (148 mL), or one 1 oz glass of hard liquor (44 mL).   Lifestyle  Work with your doctor to stay at a healthy weight or to lose weight. Ask your doctor what the best weight is for you.  Get at least 30 minutes of exercise most days of the week. This may include walking, swimming, or biking.  Get at least 30 minutes of exercise that strengthens your muscles (resistance exercise) at least 3 days a week. This may include lifting weights or doing Pilates.  Do not use any products that contain nicotine or tobacco, such as cigarettes, e-cigarettes, and chewing tobacco. If you need help quitting, ask your doctor.  Check your blood pressure at home as told by your doctor.  Keep all follow-up visits as told by your doctor. This is important.   Medicines  Take over-the-counter and prescription medicines only as told by your doctor. Follow directions carefully.  Do not skip doses of blood pressure medicine. The medicine does not work as well if you skip doses. Skipping doses also puts you at risk for problems.  Ask your doctor about side effects or reactions to medicines that you should watch for. Contact a doctor if you:  Think you are having a reaction to the medicine you are taking.  Have headaches that keep coming back (recurring).  Feel dizzy.  Have swelling in your ankles.  Have trouble with your vision. Get help right away if you:  Get a very bad headache.  Start to feel mixed up (confused).  Feel weak or numb.  Feel faint.  Have very bad pain in your: ? Chest. ? Belly (abdomen).  Throw up more than once.  Have trouble breathing. Summary  Hypertension is another name for high blood pressure.  High blood  pressure forces your heart to work harder to pump blood.  For most people, a normal blood pressure is less than 120/80.  Making healthy choices can help lower blood pressure. If your blood pressure does not get lower with healthy choices, you may need to take medicine. This information is not intended to replace advice given to you by your health care provider. Make sure you discuss any questions you have with your health care provider. Document Revised: 01/25/2018 Document Reviewed: 01/25/2018 Elsevier Patient Education  2021 [employer].

## 2020-08-11 ENCOUNTER — Other Ambulatory Visit (INDEPENDENT_AMBULATORY_CARE_PROVIDER_SITE_OTHER): Payer: No Typology Code available for payment source

## 2020-08-11 DIAGNOSIS — K921 Melena: Secondary | ICD-10-CM | POA: Diagnosis not present

## 2020-08-11 LAB — HEMOCCULT GUIAC POC 1CARD (OFFICE)
Card #2 Fecal Occult Blod, POC: NEGATIVE
Card #3 Fecal Occult Blood, POC: NEGATIVE
Fecal Occult Blood, POC: NEGATIVE

## 2020-08-13 ENCOUNTER — Telehealth: Payer: Self-pay

## 2020-08-13 NOTE — Telephone Encounter (Signed)
LVM for pt to call back for labs [facility]

## 2020-11-03 ENCOUNTER — Ambulatory Visit: Payer: No Typology Code available for payment source | Admitting: Family Medicine

## 2020-11-03 ENCOUNTER — Other Ambulatory Visit: Payer: Self-pay

## 2020-11-03 ENCOUNTER — Encounter: Payer: Self-pay | Admitting: Family Medicine

## 2020-11-03 VITALS — BP 158/84 | HR 103 | Temp 98.3°F | Wt 139.6 lb

## 2020-11-03 DIAGNOSIS — L989 Disorder of the skin and subcutaneous tissue, unspecified: Secondary | ICD-10-CM | POA: Diagnosis not present

## 2020-11-03 DIAGNOSIS — Z23 Encounter for immunization: Secondary | ICD-10-CM

## 2020-11-03 DIAGNOSIS — I10 Essential (primary) hypertension: Secondary | ICD-10-CM

## 2020-11-03 MED ORDER — LOSARTAN POTASSIUM-HCTZ 50-12.5 MG PO TABS: 1.0000 | ORAL_TABLET | Freq: Every day | ORAL | 0 refills | Status: DC

## 2020-11-03 NOTE — Progress Notes (Signed)
   Subjective:    Patient ID: Alison Zimmerman, female    DOB: 1951-03-31, 70 y.o.   MRN: [id number]  HPI She is here for an interval evaluation.  Review of the record indicates she does need follow-up on several immunizations.  She is interested in getting them.  She also has lesions present on her right forearm and the been there for several years.  She does tend to pick at these.  She also is here for a blood pressure recheck.   Review of Systems     Objective:   Physical Exam Alert and in no distress.  Multiple round 1 and half centimeter healing lesions are noted on the right arm and forearm.  No lesions are seen anywhere else. Blood pressure is recorded and is high.       Assessment & Plan:  Essential hypertension - Plan: losartan-hydrochlorothiazide (HYZAAR) 50-12.5 MG tablet  Skin lesions  Need for vaccination against Streptococcus pneumoniae - Plan: Pneumococcal conjugate vaccine 13-valent  Immunization, viral disease - Plan: PFIZER Comirnaty(GRAY TOP)COVID-19 Vaccine I will place her on Hyzaar for her blood pressure.  Discussed checking it at home but will have her come back here in 1 month for recheck on that. Discussed the skin lesions with her and we will send her to dermatology.  These things could be self-induced issues.

## 2020-12-18 ENCOUNTER — Other Ambulatory Visit: Payer: Self-pay

## 2020-12-18 ENCOUNTER — Ambulatory Visit: Payer: No Typology Code available for payment source | Admitting: Family Medicine

## 2020-12-18 ENCOUNTER — Encounter: Payer: Self-pay | Admitting: Family Medicine

## 2020-12-18 VITALS — BP 142/80 | HR 98 | Temp 98.2°F | Wt 138.4 lb

## 2020-12-18 DIAGNOSIS — I10 Essential (primary) hypertension: Secondary | ICD-10-CM

## 2020-12-18 MED ORDER — LOSARTAN POTASSIUM-HCTZ 50-12.5 MG PO TABS: 1.0000 | ORAL_TABLET | Freq: Every day | ORAL | 3 refills | Status: DC

## 2020-12-18 NOTE — Progress Notes (Signed)
   Subjective:    Patient ID: Alison Zimmerman, female    DOB: 09/16/1950, 70 y.o.   MRN: [id number]  HPI She is here for recheck on her blood pressure.  She has been taking her medications without any problems.  She has no particular complaints.   Review of Systems     Objective:   Physical Exam Alert and in no distress.  Blood pressure is recorded.       Assessment & Plan:  Essential hypertension - Plan: losartan-hydrochlorothiazide (HYZAAR) 50-12.5 MG tablet I think her blood pressure is adequately controlled especially considering her age.  Explained that this is a control rather than cure issue.

## 2021-06-12 ENCOUNTER — Ambulatory Visit: Payer: No Typology Code available for payment source | Admitting: Medical

## 2021-06-12 ENCOUNTER — Other Ambulatory Visit: Payer: Self-pay

## 2021-06-12 VITALS — BP 140/92 | HR 83 | Temp 97.9°F | Wt 136.6 lb

## 2021-06-12 DIAGNOSIS — M21612 Bunion of left foot: Secondary | ICD-10-CM

## 2021-06-12 DIAGNOSIS — R195 Other fecal abnormalities: Secondary | ICD-10-CM

## 2021-06-12 DIAGNOSIS — R109 Unspecified abdominal pain: Secondary | ICD-10-CM

## 2021-06-12 DIAGNOSIS — Z1211 Encounter for screening for malignant neoplasm of colon: Secondary | ICD-10-CM | POA: Diagnosis not present

## 2021-06-12 DIAGNOSIS — I10 Essential (primary) hypertension: Secondary | ICD-10-CM

## 2021-06-12 MED ORDER — LOSARTAN POTASSIUM-HCTZ 100-12.5 MG PO TABS: 1.0000 | ORAL_TABLET | Freq: Every day | ORAL | 1 refills | Status: DC

## 2021-06-12 NOTE — Progress Notes (Signed)
Subjective:  Alison Zimmerman is a 71 y.o. female who presents for Chief Complaint  Patient presents with   Diarrhea    Diarrhea x 3 days. Not constant   Here with husband today  Here for abdominal pain, diarrhea.  Started 3 days ago.   No current abdominal pain.  Last diarrhea 3 days ago.  No current diarrhea either.  No blood in stool.   No fever.  No nausea, no vomiting.  No body ache or chills.  Had some vertigo dizziness that same day.  Symptoms have basically resolved.  She also may look at her left great toe.  The left toe is bending towards the other toe.  Causing some discomfort.  She is using some gauze to separate the 2 toes.  She has a history of bunion surgery on the right.  Hypertension-compliant with medication.  Not checking blood pressures at home.  Pressure elevated today.  Husband thinks she ate 2 many chic filet sandwiches.  No chest pain, no palpitations, no edema.  No other aggravating or relieving factors.    No other c/o.  Past Medical History:  Diagnosis Date   Ductal carcinoma in situ of breast dx 08/17/13--  oncologist-  dr Humphrey Rolls   Stage 0 (Tis, Nx)  High Grade DCIS right breast,  ER/ PR + --  per pt on 06-10-2014  states juicing w/ fresh fruit and vegatable's for natural treatment at this time   History of sepsis    admitted 05-16-2015  w/ urosepsis/  acute renal failure due to pyelonephritis / stone obstruction   Migraine    Right ureteral stone    Wears glasses    Current Outpatient Medications on File Prior to Visit  Medication Sig Dispense Refill   diphenhydrAMINE-Acetaminophen (PERCOGESIC PO) Take by mouth.     losartan-hydrochlorothiazide (HYZAAR) 50-12.5 MG tablet Take 1 tablet by mouth daily. 90 tablet 3   No current facility-administered medications on file prior to visit.    Past Surgical History:  Procedure Laterality Date   BUNIONECTOMY Left 2000   CYSTOSCOPY WITH RETROGRADE PYELOGRAM, URETEROSCOPY AND STENT PLACEMENT Right 06/16/2015    Procedure: CYSTOSCOPY WITH RIGHT RETROGRADE PYELOGRAM, URETEROSCOPY AND STENT EXCHANGE;  Surgeon: Nickie Retort, MD;  Location: [facility];  Service: Urology;  Laterality: Right;   CYSTOSCOPY WITH STENT PLACEMENT Right 05/17/2015   Procedure: CYSTOSCOPY, RETROGRADE PYELOGRAM WITH STENT PLACEMENT;  Surgeon: Nickie Retort, MD;  Location: [facility];  Service: Urology;  Laterality: Right;   HOLMIUM LASER APPLICATION Right 1/77/9390   Procedure: HOLMIUM LASER APPLICATION;  Surgeon: Nickie Retort, MD;  Location[facility];  Service: Urology;  Laterality: Right;   ORIF FEMUR FRACTURE Left age 50   STONE EXTRACTION WITH BASKET Right 06/16/2015   Procedure: STONE EXTRACTION WITH BASKET;  Surgeon: Nickie Retort, MD;  Location: [facility];  Service: Urology;  Laterality: Right;   TUBAL LIGATION  yrs ago    The following portions of the patient's history were reviewed and updated as appropriate: allergies, current medications, past family history, past medical history, past social history, past surgical history and problem list.  ROS Otherwise as in subjective above    Objective: BP (!) 140/92    Pulse 83    Temp 97.9 F (36.6 C)    Wt 136 lb 9.6 oz (62 kg)    LMP  (LMP Unknown)    BMI 24.20 kg/m   BP Readings from Last 3 Encounters:  06/12/21 (!) 140/92  12/18/20 (!) 142/80  11/03/20 (!) 158/84   Wt Readings from Last 3 Encounters:  06/12/21 136 lb 9.6 oz (62 kg)  12/18/20 138 lb 6.4 oz (62.8 kg)  11/03/20 139 lb 9.6 oz (63.3 kg)    General appearance: alert, no distress, well developed, well nourished Neck: supple, no lymphadenopathy, no thyromegaly, no masses Heart: RRR, normal S1, S2, no murmurs Lungs: CTA bilaterally, no wheezes, rhonchi, or rales Abdomen: +bs, soft, non tender, non distended, no masses, no hepatomegaly, no splenomegaly Pulses: 2+ radial pulses, 2+ pedal pulses, normal cap refill Ext: no  edema Mild-moderate bunion of left great toe with displacement into the second toe otherwise foot nontender without other deformity Feet neurovascularly intact   Assessment: Encounter Diagnoses  Name Primary?   Bunion of great toe of left foot Yes   Abdominal discomfort    Loose stools    Screen for colon cancer    Essential hypertension, benign      Plan: Abdominal discomfort and loose stools-likely viral syndrome that has now resolved.  She had symptoms for 1 day.  That was 3 days ago and no symptoms today.  Exam unremarkable.  Referral for updated Cologuard test.  Last Cologuard 2019 negative.  Bunion of left toe - she will continue splinting for now.  She declines referral to podiatry.   She has had right great surgery for bunion prior.  Hypertension-blood pressure not at goal.  Increase dose.  Recheck soon for physical and fasting labs  Alison Zimmerman was seen today for diarrhea.  Diagnoses and all orders for this visit:  Bunion of great toe of left foot  Abdominal discomfort  Loose stools  Screen for colon cancer -     Cologuard  Essential hypertension, benign  Other orders -     losartan-hydrochlorothiazide (HYZAAR) 100-12.5 MG tablet; Take 1 tablet by mouth daily.    Follow up: soon for fasting physical

## 2021-06-22 LAB — COLOGUARD

## 2021-06-23 ENCOUNTER — Encounter: Payer: Self-pay | Admitting: Family Medicine

## 2021-07-09 ENCOUNTER — Other Ambulatory Visit: Payer: Self-pay | Admitting: Internal Medicine

## 2021-07-09 DIAGNOSIS — R195 Other fecal abnormalities: Secondary | ICD-10-CM

## 2021-07-09 LAB — COLOGUARD: COLOGUARD: POSITIVE — AB

## 2021-07-10 ENCOUNTER — Encounter: Payer: Self-pay | Admitting: Gastroenterology

## 2021-08-11 ENCOUNTER — Encounter: Payer: No Typology Code available for payment source | Admitting: Gastroenterology

## 2021-09-10 ENCOUNTER — Ambulatory Visit (INDEPENDENT_AMBULATORY_CARE_PROVIDER_SITE_OTHER): Payer: No Typology Code available for payment source | Admitting: Gastroenterology

## 2021-09-10 VITALS — BP 126/72 | HR 64 | Ht 62.5 in | Wt 135.0 lb

## 2021-09-10 DIAGNOSIS — R195 Other fecal abnormalities: Secondary | ICD-10-CM

## 2021-09-10 DIAGNOSIS — Z1211 Encounter for screening for malignant neoplasm of colon: Secondary | ICD-10-CM | POA: Diagnosis not present

## 2021-09-10 MED ORDER — NA SULFATE-K SULFATE-MG SULF 17.5-3.13-1.6 GM/177ML PO SOLN: 1.0000 | ORAL | 0 refills | Status: DC

## 2021-09-10 NOTE — Progress Notes (Signed)
? ?GASTROENTEROLOGY OUTPATIENT CLINIC VISIT  ? ?Primary Care Provider ?Carlena Hurl, PA-C ?[address] ?[place] [address] ?[phone] ? ?Referring Provider ?Carlena Hurl, PA-C ?[address] ?[place],  [place] [address] ?[phone] ? ?Patient Profile: ?Alison Zimmerman is a 71 y.o. female with a pmh significant for hypertension, migraines, prior nephrolithiasis, chronic cough.  The patient presents to the [facility] for an evaluation and management of problem(s) noted below: ? ?Problem List ?1. Positive colorectal cancer screening using Cologuard test   ?2. Colon cancer screening   ? ? ?History of Present Illness ?This is the patient's first visit to the outpatient [place] clinic.  She is accompanied by her husband.  The.  Patient has undergone previous colon cancer screening via Cologuard testing.  She had a negative Cologuard in 2016 as well as in 2019.  When they tried to perform her Cologuard in January 2022  it was not able to be processed initially to the specimen.  This was then reperformed and her Cologuard testing returned positive.  It is for this reason that the patient is referred for consideration of colonoscopy.  She was offered a direct procedure but the patient and family wanted to meet in clinic before scheduling.  The patient has not had any alteration of her bowel habits.  She has not noted any blood in her stools.  There is no family history of colon cancer that she is aware of.  They wondered about the possibility of repeating a Cologuard versus moving forward with colonoscopy.  She has never had an upper. ? ?GI Review of Systems ?Positive as above ?Negative for dysphagia, odynophagia, nausea, vomiting, alteration of bowel habits, melena ? ?Review of Systems ?General: Denies fevers/chills/weight loss unintentionally ?Cardiovascular: Denies chest pain ?Pulmonary: Denies shortness of breath ?Gastroenterological: See HPI ?Genitourinary: Denies  darkened urine ?Hematological: Denies easy bruising/bleeding ?Dermatological: Denies jaundice ?Psychological: Mood is stable ? ? ?Medications ?Current Outpatient Medications  ?Medication Sig Dispense Refill  ? diphenhydrAMINE-Acetaminophen (PERCOGESIC PO) Take by mouth.    ? losartan-hydrochlorothiazide (HYZAAR) 100-12.5 MG tablet Take 1 tablet by mouth daily. 90 tablet 1  ? Na Sulfate-K Sulfate-Mg Sulf (SUPREP BOWEL PREP KIT) 17.5-3.13-1.6 GM/177ML SOLN Take 1 kit by mouth as directed. For colonoscopy prep 354 mL 0  ? ?No current facility-administered medications for this visit.  ? ? ?Allergies ?Allergies  ?Allergen Reactions  ? Other   ?  No blood products. 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 ?Honeydew--  hives  ? ? ?Histories ?Past Medical History:  ?Diagnosis Date  ? Ductal carcinoma in situ of breast dx 08/17/13--  oncologist-  dr Humphrey Rolls  ? Stage 0 (Tis, Nx)  High Grade DCIS right breast,  ER/ PR + --  per pt on 06-10-2014  states juicing w/ fresh fruit and vegatable's for natural treatment at this time  ? History of sepsis   ? admitted 05-16-2015  w/ urosepsis/  acute renal failure due to pyelonephritis / stone obstruction  ? Migraine   ? Right ureteral stone   ? Wears glasses   ? ?Past Surgical History:  ?Procedure Laterality Date  ? BUNIONECTOMY Left 2000  ? CYSTOSCOPY WITH RETROGRADE PYELOGRAM, URETEROSCOPY AND STENT PLACEMENT Right 06/16/2015  ? Procedure: CYSTOSCOPY WITH RIGHT RETROGRADE PYELOGRAM, URETEROSCOPY AND STENT EXCHANGE;  Surgeon: Nickie Retort, MD;  Location: [facility];  Service: Urology;  Laterality: Right;  ? CYSTOSCOPY WITH STENT PLACEMENT Right 05/17/2015  ? Procedure: CYSTOSCOPY, RETROGRADE PYELOGRAM WITH STENT PLACEMENT;  Surgeon:  Nickie Retort, MD;  Location: [facility];  Service: Urology;  Laterality: Right;  ? HOLMIUM LASER APPLICATION Right 4/43/1540  ? Procedure: HOLMIUM LASER APPLICATION;  Surgeon: Nickie Retort, MD;  Location: [facility];  Service:  Urology;  Laterality: Right;  ? ORIF FEMUR FRACTURE Left age 38  ? STONE EXTRACTION WITH BASKET Right 06/16/2015  ? Procedure: STONE EXTRACTION WITH BASKET;  Surgeon: Nickie Retort, MD;  Location: [facility];  Service: Urology;  Laterality: Right;  ? TUBAL LIGATION  yrs ago  ? ?Social History  ? ?Socioeconomic History  ? Marital status: Married  ?  Spouse name: Not on file  ? Number of children: Not on file  ? Years of education: Not on file  ? Highest education level: Not on file  ?Occupational History  ? Not on file  ?Tobacco Use  ? Smoking status: Never  ? Smokeless tobacco: Never  ?Substance and Sexual Activity  ? Alcohol use: No  ? Drug use: No  ? Sexual activity: Not on file  ?Other Topics Concern  ? Not on file  ?Social History Narrative  ? Married, has 2 daughters, 3 children.  Exercise - low impact water aerobics, retired, use to work for [employer];    ? ?Social Determinants of Health  ? ?Financial Resource Strain: Not on file  ?Food Insecurity: Not on file  ?Transportation Needs: Not on file  ?Physical Activity: Not on file  ?Stress: Not on file  ?Social Connections: Not on file  ?Intimate Partner Violence: Not on file  ? ?Family History  ?Problem Relation Age of Onset  ? Stroke Mother   ? Stroke Father   ? Cancer Neg Hx   ? Diabetes Neg Hx   ? Heart disease Neg Hx   ? Hypertension Neg Hx   ? Colon cancer Neg Hx   ? Esophageal cancer Neg Hx   ? Inflammatory bowel disease Neg Hx   ? Liver disease Neg Hx   ? Pancreatic cancer Neg Hx   ? Rectal cancer Neg Hx   ? Stomach cancer Neg Hx   ? ?I have reviewed her medical, social, and family history in detail and updated the electronic medical record as necessary.  ? ? ?PHYSICAL EXAMINATION  ?BP 126/72   Pulse 64   Ht 5' 2.5" (1.588 m)   Wt 135 lb (61.2 kg)   LMP  (LMP Unknown)   BMI 24.30 kg/m?  ?Wt Readings from Last 3 Encounters:  ?09/10/21 135 lb (61.2 kg)  ?06/12/21 136 lb 9.6 oz (62 kg)  ?12/18/20 138 lb 6.4 oz (62.8 kg)  ?GEN: NAD, appears  stated age, doesn't appear chronically ill, accompanied by husband ?PSYCH: Cooperative, without pressured speech ?EYE: Conjunctivae pink, sclerae anicteric ?ENT: MMM ?CV: Nontachycardic ?RESP: No audible wheezing ?GI: NABS, soft, ND, without rebound or guarding ?MSK/EXT: No significant lower extremity edema ?SKIN: No jaundice ?NEURO:  Alert & Oriented x 3, no focal deficits ? ? ?REVIEW OF DATA  ?I reviewed the following data at the time of this encounter: ? ?GI Procedures and Studies  ?No relevant studies to review ? ?Laboratory Studies  ?Reviewed those in epic ? ?Imaging Studies  ?No relevant studies to review ? ? ?ASSESSMENT  ?Ms. Pickert is a 71 y.o. female with a pmh significant for hypertension, migraines, prior nephrolithiasis, chronic cough.  The patient is seen today for evaluation and management of: ? ?1. Positive colorectal cancer screening using Cologuard test   ?2. Colon  cancer screening   ? ?The patient is hemodynamically and clinically stable.  She has had appropriate colon cancer screening via Cologuard testing since 2016.  Her most recent Cologuard testing has returned positive.  The patient and husband initially asked about potentially repeating Cologuard testing to see if it remained positive before undergoing colonoscopy.  I told them that it was not clear if this would be covered by insurance but they certainly can have the opportunity to do that if they wanted to.  We subsequently discussed the role of colonoscopy in the setting of positive Cologuard testing.  The risks and benefits of endoscopic evaluation were discussed with the patient; these include but are not limited to the risk of perforation, infection, bleeding, missed lesions, lack of diagnosis, severe illness requiring hospitalization, as well as anesthesia and sedation related illnesses.  After our extensive discussion, the patient and/or family is agreeable to proceed.  We will plan to proceed with colonoscopy and schedule  accordingly.  We will push this colonoscopy out for a few weeks as she may be transitioning her secondary insurance in the next few weeks and if it does change they will let us know about it. ? ? ?PLAN  ?Proceed with sch

## 2021-09-10 NOTE — Patient Instructions (Signed)
You have been scheduled for a colonoscopy. Please follow written instructions given to you at your visit today.  ?Please pick up your prep supplies at the pharmacy within the next 1-3 days. ?If you use inhalers (even only as needed), please bring them with you on the day of your procedure. ? ?We have sent the following medications to your pharmacy for you to pick up at your convenience: ?Suprep  ? ? ? ?If you are age 72 or older, your body mass index should be between 23-30. Your Body mass index is 24.3 kg/m?Marland Kitchen If this is out of the aforementioned range listed, please consider follow up with your Primary Care Provider. ? ?If you are age 46 or younger, your body mass index should be between 19-25. Your Body mass index is 24.3 kg/m?Marland Kitchen If this is out of the aformentioned range listed, please consider follow up with your Primary Care Provider.  ? ?________________________________________________________ ? ?The [facility] providers would like to encourage you to use [facility] to communicate with providers for non-urgent requests or questions.  Due to long hold times on the telephone, sending your provider a message by [facility] may be a faster and more efficient way to get a response.  Please allow 48 business hours for a response.  Please remember that this is for non-urgent requests.  ? ?Thank you for choosing me and [facility]. ? ?Dr. Rush Landmark ? ?

## 2021-09-11 ENCOUNTER — Encounter: Payer: Self-pay | Admitting: Gastroenterology

## 2021-09-11 DIAGNOSIS — R195 Other fecal abnormalities: Secondary | ICD-10-CM | POA: Insufficient documentation

## 2021-09-11 DIAGNOSIS — Z1211 Encounter for screening for malignant neoplasm of colon: Secondary | ICD-10-CM | POA: Insufficient documentation

## 2021-09-16 ENCOUNTER — Ambulatory Visit: Payer: No Typology Code available for payment source | Admitting: Medical

## 2021-09-25 ENCOUNTER — Encounter: Payer: Self-pay | Admitting: Medical

## 2021-11-10 ENCOUNTER — Encounter: Payer: Self-pay | Admitting: Gastroenterology

## 2021-11-17 ENCOUNTER — Ambulatory Visit (AMBULATORY_SURGERY_CENTER): Payer: No Typology Code available for payment source | Admitting: Gastroenterology

## 2021-11-17 ENCOUNTER — Encounter: Payer: Self-pay | Admitting: Gastroenterology

## 2021-11-17 VITALS — BP 112/79 | HR 61 | Temp 96.2°F | Resp 13 | Ht 62.0 in | Wt 135.0 lb

## 2021-11-17 DIAGNOSIS — Z1211 Encounter for screening for malignant neoplasm of colon: Secondary | ICD-10-CM

## 2021-11-17 DIAGNOSIS — K635 Polyp of colon: Secondary | ICD-10-CM

## 2021-11-17 DIAGNOSIS — K621 Rectal polyp: Secondary | ICD-10-CM | POA: Diagnosis not present

## 2021-11-17 DIAGNOSIS — K6389 Other specified diseases of intestine: Secondary | ICD-10-CM | POA: Diagnosis not present

## 2021-11-17 DIAGNOSIS — D124 Benign neoplasm of descending colon: Secondary | ICD-10-CM

## 2021-11-17 DIAGNOSIS — R195 Other fecal abnormalities: Secondary | ICD-10-CM | POA: Diagnosis not present

## 2021-11-17 HISTORY — PX: COLONOSCOPY: SHX174

## 2021-11-17 MED ORDER — SODIUM CHLORIDE 0.9 % IV SOLN: 500.0000 mL | Freq: Once | INTRAVENOUS | Status: DC

## 2021-11-17 NOTE — Op Note (Signed)
[facility] Patient Name: Alison Zimmerman Procedure Date: 11/17/2021 1:38 PM MRN: [id number] Endoscopist: [name] , MD Age: 71 Referring MD:  Date of Birth: 21-Sep-1950 Gender: Female Account #: [id number] Procedure:                Colonoscopy Indications:              Positive Cologuard test, This is the patient's                            first colonoscopy Medicines:                Monitored Anesthesia Care Procedure:                Pre-Anesthesia Assessment:                           - Prior to the procedure, a History and Physical                            was performed, and patient medications and                            allergies were reviewed. The patient's tolerance of                            previous anesthesia was also reviewed. The risks                            and benefits of the procedure and the sedation                            options and risks were discussed with the patient.                            All questions were answered, and informed consent                            was obtained. Prior Anticoagulants: The patient has                            taken no previous anticoagulant or antiplatelet                            agents. ASA Grade Assessment: II - A patient with                            mild systemic disease. After reviewing the risks                            and benefits, the patient was deemed in                            satisfactory condition to undergo the procedure.  After obtaining informed consent, the colonoscope                            was passed under direct vision. Throughout the                            procedure, the patient's blood pressure, pulse, and                            oxygen saturations were monitored continuously. The                            CF [id number] #[id number] was introduced through the anus                            and advanced to the 5 cm into the  ileum. The                            colonoscopy was performed without difficulty. The                            patient tolerated the procedure. The quality of the                            bowel preparation was good. The terminal ileum,                            ileocecal valve, appendiceal orifice, and rectum                            were photographed. Scope In: 1:42:39 PM Scope Out: 1:54:33 PM Scope Withdrawal Time: 0 hours 9 minutes 21 seconds  Total Procedure Duration: 0 hours 11 minutes 54 seconds  Findings:                 The digital rectal exam findings include                            hemorrhoids. Pertinent negatives include no                            palpable rectal lesions.                           The terminal ileum and ileocecal valve appeared                            normal.                           Two sessile polyps were found in the rectum and                            descending colon. The polyps were 3 to 4 mm in  size. These polyps were removed with a cold snare.                            Resection and retrieval were complete.                           Multiple small-mouthed diverticula were found in                            the recto-sigmoid colon and sigmoid colon.                           Normal mucosa was found in the entire colon                            otherwise.                           Non-bleeding non-thrombosed external and internal                            hemorrhoids were found during retroflexion, during                            perianal exam and during digital exam. The                            hemorrhoids were Grade II (internal hemorrhoids                            that prolapse but reduce spontaneously). Complications:            No immediate complications. Estimated Blood Loss:     Estimated blood loss was minimal. Impression:               - Hemorrhoids found on digital rectal exam.                            - The examined portion of the ileum was normal.                           - Two 3 to 4 mm polyps in the rectum and in the                            descending colon, removed with a cold snare.                            Resected and retrieved.                           - Diverticulosis in the recto-sigmoid colon and in                            the sigmoid colon.                           -  Normal mucosa in the entire examined colon                            otherwise.                           - Non-bleeding non-thrombosed external and internal                            hemorrhoids. Recommendation:           - The patient will be observed post-procedure,                            until all discharge criteria are met.                           - Discharge patient to home.                           - Patient has a contact number available for                            emergencies. The signs and symptoms of potential                            delayed complications were discussed with the                            patient. Return to normal activities tomorrow.                            Written discharge instructions were provided to the                            patient.                           - High fiber diet.                           - Use FiberCon 1-2 tablets PO daily.                           - Continue present medications.                           - Await pathology results.                           - Repeat colonoscopy in 10/04/08 years for                            surveillance based on pathology results.                           - The findings and recommendations were discussed  with the patient.                           - The findings and recommendations were discussed                            with the patient's family. [name], MD 11/17/2021 1:58:56 PM

## 2021-11-17 NOTE — Progress Notes (Signed)
No problems noted in the recovery room. maw 

## 2021-11-17 NOTE — Progress Notes (Signed)
Pt forgetful, does not follow directions for getting dressed and gets confused about undressing two times after instructions, then pt is not sure if she ate today, went to lobby and husband does not know if she ate a muffin today but thinks it was yesterday, pt husband states she "forgets" at times but no dx from doctor and nothing "out of the usual" according to husband.  Pt and husband insist muffin and egg was yesterday and not today, and that drinking water stopped between 02-1029 this morning, let pt and husband know this is very important for her safety, they agree this is the actual times, inform Gershon Crane, CRNA of the above info.

## 2021-11-17 NOTE — Patient Instructions (Addendum)
Handouts were given to your care partner on polyps, diverticulosis, hemorrhoids and a high fiber diet with liberal fluid intake. Use FiberCon 1-2 tablets by mouth DAILY. You may resume your current medications today. Await biopsy results.  May take 1-3 weeks to receive pathology results. Please call if any questions or concerns.     YOU HAD AN ENDOSCOPIC PROCEDURE TODAY AT [facility]:   Refer to the procedure report that was given to you for any specific questions about what was found during the examination.  If the procedure report does not answer your questions, please call your gastroenterologist to clarify.  If you requested that your care partner not be given the details of your procedure findings, then the procedure report has been included in a sealed envelope for you to review at your convenience later.  YOU SHOULD EXPECT: Some feelings of bloating in the abdomen. Passage of more gas than usual.  Walking can help get rid of the air that was put into your GI tract during the procedure and reduce the bloating. If you had a lower endoscopy (such as a colonoscopy or flexible sigmoidoscopy) you may notice spotting of blood in your stool or on the toilet paper. If you underwent a bowel prep for your procedure, you may not have a normal bowel movement for a few days.  Please Note:  You might notice some irritation and congestion in your nose or some drainage.  This is from the oxygen used during your procedure.  There is no need for concern and it should clear up in a day or so.  SYMPTOMS TO REPORT IMMEDIATELY:  Following lower endoscopy (colonoscopy or flexible sigmoidoscopy):  Excessive amounts of blood in the stool  Significant tenderness or worsening of abdominal pains  Swelling of the abdomen that is new, acute  Fever of 100F or higher   For urgent or emergent issues, a gastroenterologist can be reached at any hour by calling [phone]. Do not use MyChart  messaging for urgent concerns.    DIET:  We do recommend a small meal at first, but then you may proceed to your regular diet.  Drink plenty of fluids but you should avoid alcoholic beverages for 24 hours.  ACTIVITY:  You should plan to take it easy for the rest of today and you should NOT DRIVE or use heavy machinery until tomorrow (because of the sedation medicines used during the test).    FOLLOW UP: Our staff will call the number listed on your records 24-72 hours following your procedure to check on you and address any questions or concerns that you may have regarding the information given to you following your procedure. If we do not reach you, we will leave a message.  We will attempt to reach you two times.  During this call, we will ask if you have developed any symptoms of COVID 19. If you develop any symptoms (ie: fever, flu-like symptoms, shortness of breath, cough etc.) before then, please call [phone].  If you test positive for Covid 19 in the 2 weeks post procedure, please call and report this information to [place].    If any biopsies were taken you will be contacted by phone or by letter within the next 1-3 weeks.  Please call us at [phone] if you have not heard about the biopsies in 3 weeks.    SIGNATURES/CONFIDENTIALITY: You and/or your care partner have signed paperwork which will be entered into your electronic medical record.  These signatures attest to the fact that that the information above on your After Visit Summary has been reviewed and is understood.  Full responsibility of the confidentiality of this discharge information lies with you and/or your care-partner.  

## 2021-11-17 NOTE — Progress Notes (Signed)
To pacu, VSS. Report to [email or web address] 

## 2021-11-17 NOTE — Progress Notes (Signed)
GASTROENTEROLOGY PROCEDURE H&P NOTE   Primary Care Physician: Alison Hurl, PA-C  HPI: Alison Zimmerman is a 71 y.o. female who presents for colonoscopy for positive Cologuard.  Past Medical History:  Diagnosis Date   Ductal carcinoma in situ of breast dx 08/17/13--  oncologist-  dr Humphrey Rolls   Stage 0 (Tis, Nx)  High Grade DCIS right breast,  ER/ PR + --  per pt on 06-10-2014  states juicing w/ fresh fruit and vegatable's for natural treatment at this time   History of sepsis    admitted 05-16-2015  w/ urosepsis/  acute renal failure due to pyelonephritis / stone obstruction   Migraine    Right ureteral stone    Wears glasses    Past Surgical History:  Procedure Laterality Date   BUNIONECTOMY Left 2000   CYSTOSCOPY WITH RETROGRADE PYELOGRAM, URETEROSCOPY AND STENT PLACEMENT Right 06/16/2015   Procedure: CYSTOSCOPY WITH RIGHT RETROGRADE PYELOGRAM, URETEROSCOPY AND STENT EXCHANGE;  Surgeon: Nickie Retort, MD;  Location: [facility];  Service: Urology;  Laterality: Right;   CYSTOSCOPY WITH STENT PLACEMENT Right 05/17/2015   Procedure: CYSTOSCOPY, RETROGRADE PYELOGRAM WITH STENT PLACEMENT;  Surgeon: Nickie Retort, MD;  Location: [facility];  Service: Urology;  Laterality: Right;   HOLMIUM LASER APPLICATION Right 2/70/3500   Procedure: HOLMIUM LASER APPLICATION;  Surgeon: Nickie Retort, MD;  Location: [facility];  Service: Urology;  Laterality: Right;   ORIF FEMUR FRACTURE Left age 48   STONE EXTRACTION WITH BASKET Right 06/16/2015   Procedure: STONE EXTRACTION WITH BASKET;  Surgeon: Nickie Retort, MD;  Location: [facility];  Service: Urology;  Laterality: Right;   TUBAL LIGATION  yrs ago   Current Outpatient Medications  Medication Sig Dispense Refill   diphenhydrAMINE-Acetaminophen (PERCOGESIC PO) Take by mouth.     losartan-hydrochlorothiazide (HYZAAR) 100-12.5 MG tablet Take 1 tablet by mouth daily. 90 tablet 1   Na  Sulfate-K Sulfate-Mg Sulf (SUPREP BOWEL PREP KIT) 17.5-3.13-1.6 GM/177ML SOLN Take 1 kit by mouth as directed. For colonoscopy prep 354 mL 0   No current facility-administered medications for this visit.    Current Outpatient Medications:    diphenhydrAMINE-Acetaminophen (PERCOGESIC PO), Take by mouth., Disp: , Rfl:    losartan-hydrochlorothiazide (HYZAAR) 100-12.5 MG tablet, Take 1 tablet by mouth daily., Disp: 90 tablet, Rfl: 1   Na Sulfate-K Sulfate-Mg Sulf (SUPREP BOWEL PREP KIT) 17.5-3.13-1.6 GM/177ML SOLN, Take 1 kit by mouth as directed. For colonoscopy prep, Disp: 354 mL, Rfl: 0 Allergies  Allergen Reactions   Other     No blood products. 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 Honeydew--  hives   Family History  Problem Relation Age of Onset   Stroke Mother    Stroke Father    Cancer Neg Hx    Diabetes Neg Hx    Heart disease Neg Hx    Hypertension Neg Hx    Colon cancer Neg Hx    Esophageal cancer Neg Hx    Inflammatory bowel disease Neg Hx    Liver disease Neg Hx    Pancreatic cancer Neg Hx    Rectal cancer Neg Hx    Stomach cancer Neg Hx    Social History   Socioeconomic History   Marital status: Married    Spouse name: Not on file   Number of children: Not on file   Years of education: Not on file   Highest education level: Not on file  Occupational History   Not  on file  Tobacco Use   Smoking status: Never   Smokeless tobacco: Never  Substance and Sexual Activity   Alcohol use: No   Drug use: No   Sexual activity: Not on file  Other Topics Concern   Not on file  Social History Narrative   Married, has 2 daughters, 3 children.  Exercise - low impact water aerobics, retired, use to work for [employer];     Social Determinants of Health   Financial Resource Strain: Not on file  Food Insecurity: Not on file  Transportation Needs: Not on file  Physical Activity: Not on file  Stress: Not on file  Social Connections: Not on file  Intimate Partner Violence: Not on file     Physical Exam: There were no vitals filed for this visit. There is no height or weight on file to calculate BMI. GEN: NAD EYE: Sclerae anicteric ENT: MMM CV: Non-tachycardic GI: Soft, NT/ND NEURO:  Alert & Oriented x 3  Lab Results: No results for input(s): "WBC", "HGB", "HCT", "PLT" in the last 72 hours. BMET No results for input(s): "NA", "K", "CL", "CO2", "GLUCOSE", "BUN", "CREATININE", "CALCIUM" in the last 72 hours. LFT No results for input(s): "PROT", "ALBUMIN", "AST", "ALT", "ALKPHOS", "BILITOT", "BILIDIR", "IBILI" in the last 72 hours. PT/INR No results for input(s): "LABPROT", "INR" in the last 72 hours.   Impression / Plan: This is a 71 y.o.female who presents for colonoscopy for positive Cologuard.  The risks and benefits of endoscopic evaluation/treatment were discussed with the patient and/or family; these include but are not limited to the risk of perforation, infection, bleeding, missed lesions, lack of diagnosis, severe illness requiring hospitalization, as well as anesthesia and sedation related illnesses.  The patient's history has been reviewed, patient examined, no change in status, and deemed stable for procedure.  The patient and/or family is agreeable to proceed.    [name], MD [facility] Office # [phone]

## 2021-11-17 NOTE — Progress Notes (Signed)
Called to room to assist during endoscopic procedure.  Patient ID and intended procedure confirmed with present staff. Received instructions for my participation in the procedure from the performing physician.  

## 2021-11-18 ENCOUNTER — Telehealth: Payer: Self-pay

## 2021-11-18 NOTE — Telephone Encounter (Signed)
  Follow up Call-     11/17/2021   12:59 PM  Call back number  Post procedure Call Back phone  # [phone]  Permission to leave phone message Yes     Left message

## 2021-12-01 ENCOUNTER — Encounter: Payer: Self-pay | Admitting: Gastroenterology

## 2021-12-18 ENCOUNTER — Ambulatory Visit: Payer: No Typology Code available for payment source | Admitting: Medical

## 2022-01-21 ENCOUNTER — Ambulatory Visit (INDEPENDENT_AMBULATORY_CARE_PROVIDER_SITE_OTHER): Payer: No Typology Code available for payment source | Admitting: Medical

## 2022-01-21 ENCOUNTER — Encounter: Payer: Self-pay | Admitting: Medical

## 2022-01-21 VITALS — BP 138/82 | HR 100 | Temp 98.2°F | Ht 63.0 in | Wt 134.2 lb

## 2022-01-21 DIAGNOSIS — Z136 Encounter for screening for cardiovascular disorders: Secondary | ICD-10-CM

## 2022-01-21 DIAGNOSIS — I10 Essential (primary) hypertension: Secondary | ICD-10-CM

## 2022-01-21 DIAGNOSIS — D638 Anemia in other chronic diseases classified elsewhere: Secondary | ICD-10-CM

## 2022-01-21 DIAGNOSIS — Z13220 Encounter for screening for lipoid disorders: Secondary | ICD-10-CM | POA: Insufficient documentation

## 2022-01-21 DIAGNOSIS — Z87442 Personal history of urinary calculi: Secondary | ICD-10-CM

## 2022-01-21 DIAGNOSIS — D72829 Elevated white blood cell count, unspecified: Secondary | ICD-10-CM

## 2022-01-21 DIAGNOSIS — R413 Other amnesia: Secondary | ICD-10-CM

## 2022-01-21 DIAGNOSIS — L439 Lichen planus, unspecified: Secondary | ICD-10-CM

## 2022-01-21 DIAGNOSIS — Z0000 Encounter for general adult medical examination without abnormal findings: Secondary | ICD-10-CM | POA: Insufficient documentation

## 2022-01-21 DIAGNOSIS — Z780 Asymptomatic menopausal state: Secondary | ICD-10-CM | POA: Diagnosis not present

## 2022-01-21 LAB — POCT URINALYSIS DIP (PROADVANTAGE DEVICE)
Bilirubin, UA: NEGATIVE
Blood, UA: NEGATIVE
Glucose, UA: NEGATIVE mg/dL
Ketones, POC UA: NEGATIVE mg/dL
Nitrite, UA: NEGATIVE
Protein Ur, POC: NEGATIVE mg/dL
Specific Gravity, Urine: 1.025
Urobilinogen, Ur: 0.2
pH, UA: 6 (ref 5.0–8.0)

## 2022-01-21 MED ORDER — TRIAMCINOLONE ACETONIDE 0.1 % EX CREA: 1.0000 | TOPICAL_CREAM | Freq: Two times a day (BID) | CUTANEOUS | 0 refills | Status: DC

## 2022-01-21 MED ORDER — LOSARTAN POTASSIUM-HCTZ 100-12.5 MG PO TABS
1.0000 | ORAL_TABLET | Freq: Every day | ORAL | 3 refills | Status: DC
Start: 2022-01-21 — End: 2023-04-25

## 2022-01-21 NOTE — Progress Notes (Signed)
Subjective:    Alison Zimmerman is a 71 y.o. female who presents for Preventative Services visit and chronic medical problems/med check visit.    Chief Complaint  Patient presents with   Annual Exam    CPE loss mom 2019    Primary Care Provider Chanze Teagle, Camelia Eng, PA-C here for primary care  Current Health Care Team: Dentist Eye doctor Dr. Baruch Gouty, urology Dr. [name], GI Dr. Nicholas Lose, hematology, oncology   Medical Services you may have received from other than [facility] providers in the past year (date may be approximate) none  Exercise Current exercise habits: stretching.   Walks some.  Is planning to get back to water aerobics  Nutrition/Diet Current diet: heallth  Depression Screen    01/21/2022    2:42 PM  Depression screen PHQ 2/9  Decreased Interest 0  Down, Depressed, Hopeless 0  PHQ - 2 Score 0    Activities of Daily Living Screen/Functional Status Survey Is the patient deaf or have difficulty hearing?: No Does the patient have difficulty seeing, even when wearing glasses/contacts?: No Does the patient have difficulty concentrating, remembering, or making decisions?: Yes Does the patient have difficulty walking or climbing stairs?: No Does the patient have difficulty dressing or bathing?: No Does the patient have difficulty doing errands alone such as visiting a doctor's office or shopping?: No  Can patient draw a clock face showing 3:15 oclock, no  Fall Risk Screen    01/21/2022    2:42 PM 06/12/2021    1:50 PM  [place] in the past year? 0 0  Number falls in past yr: 0 0  Injury with Fall? 0 0  Risk for fall due to : No Fall Risks No Fall Risks  Follow up Falls evaluation completed Falls evaluation completed    Gait Assessment: Normal gait observed no  Advanced directives Does patient have a [place]? no Does patient have a Living Will? no  Past Medical History:  Diagnosis Date   Allergy     honeydew melon   Anemia    Ductal carcinoma in situ of breast dx 08/17/13--  oncologist-  dr Humphrey Rolls   Stage 0 (Tis, Nx)  High Grade DCIS right breast,  ER/ PR + --  per pt on 06-10-2014  states juicing w/ fresh fruit and vegatable's for natural treatment at this time   History of sepsis    admitted 05-16-2015  w/ urosepsis/  acute renal failure due to pyelonephritis / stone obstruction   Hypertension    Kidney stone    Migraine    Right ureteral stone    Wears glasses     Past Surgical History:  Procedure Laterality Date   BUNIONECTOMY Left 05/31/1998   COLONOSCOPY  11/17/2021   CYSTOSCOPY WITH RETROGRADE PYELOGRAM, URETEROSCOPY AND STENT PLACEMENT Right 06/16/2015   Procedure: CYSTOSCOPY WITH RIGHT RETROGRADE PYELOGRAM, URETEROSCOPY AND STENT EXCHANGE;  Surgeon: Nickie Retort, MD;  Location[facility];  Service: Urology;  Laterality: Right;   CYSTOSCOPY WITH STENT PLACEMENT Right 05/17/2015   Procedure: CYSTOSCOPY, RETROGRADE PYELOGRAM WITH STENT PLACEMENT;  Surgeon: Nickie Retort, MD;  Location: [facility];  Service: Urology;  Laterality: Right;   HOLMIUM LASER APPLICATION Right 19/50/9326   Procedure: HOLMIUM LASER APPLICATION;  Surgeon: Nickie Retort, MD;  Location: [facility];  Service: Urology;  Laterality: Right;   ORIF FEMUR FRACTURE Left age 66   STONE EXTRACTION WITH BASKET Right  06/16/2015   Procedure: STONE EXTRACTION WITH BASKET;  Surgeon: Nickie Retort, MD;  Location: [facility];  Service: Urology;  Laterality: Right;   TUBAL LIGATION  yrs ago    Social History   Socioeconomic History   Marital status: Married    Spouse name: Not on file   Number of children: Not on file   Years of education: Not on file   Highest education level: Not on file  Occupational History   Not on file  Tobacco Use   Smoking status: Never   Smokeless tobacco: Never  Vaping Use   Vaping Use: Never used  Substance and Sexual  Activity   Alcohol use: No   Drug use: Never   Sexual activity: Yes    Birth control/protection: Post-menopausal  Other Topics Concern   Not on file  Social History Narrative   Married, has 2 daughters, 3 children.  Exercise - low impact water aerobics, retired, use to work for [employer];     Social Determinants of Health   Financial Resource Strain: Not on file  Food Insecurity: Not on file  Transportation Needs: Not on file  Physical Activity: Not on file  Stress: Not on file  Social Connections: Not on file  Intimate Partner Violence: Not on file    Family History  Problem Relation Age of Onset   Stroke Mother    Stroke Father    Cancer Neg Hx    Diabetes Neg Hx    Heart disease Neg Hx    Hypertension Neg Hx    Colon cancer Neg Hx    Esophageal cancer Neg Hx    Inflammatory bowel disease Neg Hx    Liver disease Neg Hx    Pancreatic cancer Neg Hx    Rectal cancer Neg Hx    Stomach cancer Neg Hx      Current Outpatient Medications:    diphenhydrAMINE-Acetaminophen (PERCOGESIC PO), Take by mouth., Disp: , Rfl:    Misc Natural Products (MAXIMUM MEMORY PO), Take 1 each by mouth daily., Disp: , Rfl:    triamcinolone cream (KENALOG) 0.1 %, Apply 1 Application topically 2 (two) times daily., Disp: 45 g, Rfl: 0   losartan-hydrochlorothiazide (HYZAAR) 100-12.5 MG tablet, Take 1 tablet by mouth daily., Disp: 90 tablet, Rfl: 3  Allergies  Allergen Reactions   Other     No blood products. 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 Honeydew--  hives    History reviewed: allergies, current medications, past family history, past medical history, past social history, past surgical history and problem list  Chronic issues discussed: Compliant with medication  Acute issues discussed: She has history of lichen planus rash on her arms.  She has been having some flareups.  She is using a topical  Gotucream for lichen planus, made in [place].  She does have concerns about her memory.  She is using  some herbal supplements for this.  Using [employer], mental clariy information retention, and [employer], memory max cogniive support  Hypertension-compliant with losartan HCT   Objective:      Biometrics BP 138/82   Pulse 100   Temp 98.2 F (36.8 C)   Ht '5\' 3"'$  (1.6 m)   Wt 134 lb 3.2 oz (60.9 kg)   LMP  (LMP Unknown)   BMI 23.77 kg/m   Gen: wd, wn ,nad, African American female Skin: Right forearm with several smaller round brownish lesions of breast skin ranging from 5 mm diameter to a centimeter diameter, 2  particular lesions are slightly raised and crusted HEENT: normocephalic, sclerae anicteric, TMs pearly, nares patent, no discharge or erythema, pharynx normal Oral cavity: MMM, no lesions Neck: supple, no lymphadenopathy, no thyromegaly, no masses, no bruits Heart: RRR, normal S1, S2, no murmurs Lungs: CTA bilaterally, no wheezes, rhonchi, or rales Abdomen: +bs, soft, non tender, non distended, no masses, no hepatomegaly, no splenomegaly Musculoskeletal: nontender, no swelling, no obvious deformity Extremities: no edema, no cyanosis, no clubbing Pulses: 2+ symmetric, upper and lower extremities, normal cap refill Neurological: alert, CN2-12 intact, strength normal upper extremities and lower extremities, sensation normal throughout, alert and oriented x2, DTRs 2+ throughout, no cerebellar signs, gait normal Psychiatric: normal affect, behavior normal, pleasant  Breast/GYN/rectal-declined/deferred  Assessment:   Encounter Diagnoses  Name Primary?   Encounter for health maintenance examination in adult Yes   Medicare annual wellness visit, subsequent    Postmenopausal estrogen deficiency    Screening for heart disease    Anemia of chronic disease    Essential hypertension, benign    History of renal stone    Leukocytosis, unspecified type    Screening for lipid disorders    Lichen planus    Memory change      Plan:   This visit was a preventative care  visit, also known as wellness visit or routine physical.   Topics typically include healthy lifestyle, diet, exercise, preventative care, vaccinations, sick and well care, proper use of emergency dept and after hours care, as well as other concerns.     Recommendations: Continue to return yearly for your annual wellness and preventative care visits.  This gives [place] a chance to discuss healthy lifestyle, exercise, vaccinations, review your chart record, and perform screenings where appropriate.  I recommend you see your eye doctor yearly for routine vision care.  I recommend you see your dentist yearly for routine dental care including hygiene visits twice yearly.   Vaccination recommendations were reviewed Immunization History  Administered Date(s) Administered   Influenza, High Dose Seasonal PF 07/14/2017   PFIZER Comirnaty(Gray Top)Covid-19 Tri-Sucrose Vaccine 11/03/2020   PFIZER(Purple Top)SARS-COV-2 Vaccination 10/10/2019, 11/04/2019, 05/16/2020   Pneumococcal Conjugate-13 11/03/2020   Tdap 06/15/2013    Get your shingles vaccine at the pharmacy soon.  2 shots 2 months apart.   Screening for cancer: Colon cancer screening: I reviewed your colonoscopy on file that is up to date from 10/2021  Breast cancer screening: You declined future screening as of this date  Cervical cancer screening: Declined further paps.  Last pap from 2016 reviewed which was normal.   Skin cancer screening: Check your skin regularly for new changes, growing lesions, or other lesions of concern Come in for evaluation if you have skin lesions of concern.  Lung cancer screening: If you have a greater than 20 pack year history of tobacco use, then you may qualify for lung cancer screening with a chest CT scan.   Please call your insurance company to inquire about coverage for this test.  We currently don't have screenings for other cancers besides breast, cervical, colon, and lung cancers.  If you have a  strong family history of cancer or have other cancer screening concerns, please let me know.    Bone health: Get at least 150 minutes of aerobic exercise weekly Get weight bearing exercise at least once weekly Bone density test:  A bone density test is an imaging test that uses a type of X-ray to measure the amount of calcium and other minerals in your bones.  The test may be used to diagnose or screen you for a condition that causes weak or thin bones (osteoporosis), predict your risk for a broken bone (fracture), or determine how well your osteoporosis treatment is working. The bone density test is recommended for females 65 and older, or females or males <12 if certain risk factors such as thyroid disease, long term use of steroids such as for asthma or rheumatological issues, vitamin D deficiency, estrogen deficiency, family history of osteoporosis, self or family history of fragility fracture in first degree relative.  Please call to schedule your bone density test for osteoporosis screening..   [place]  [phone] [place]. [address] [place], [place] [address]   Heart health: Get at least 150 minutes of aerobic exercise weekly Limit alcohol It is important to maintain a healthy blood pressure and healthy cholesterol numbers  Heart disease screening: Screening for heart disease includes screening for blood pressure, fasting lipids, glucose/diabetes screening, BMI height to weight ratio, reviewed of smoking status, physical activity, and diet.    Goals include blood pressure 120/80 or less, maintaining a healthy lipid/cholesterol profile, preventing diabetes or keeping diabetes numbers under good control, not smoking or using tobacco products, exercising most days per week or at least 150 minutes per week of exercise, and eating healthy variety of fruits and vegetables, healthy oils, and avoiding unhealthy food choices like fried food, fast food,  high sugar and high cholesterol foods.    EKG reviewed today looks good.   If you want any additional testing such as consult with cardiology let me know.    Medical care options: I recommend you continue to seek care here first for routine care.  We try really hard to have available appointments Monday through Friday daytime hours for sick visits, acute visits, and physicals.  Urgent care should be used for after hours and weekends for significant issues that cannot wait till the next day.  The emergency department should be used for significant potentially life-threatening emergencies.  The emergency department is expensive, can often have long wait times for less significant concerns, so try to utilize primary care, urgent care, or telemedicine when possible to avoid unnecessary trips to the emergency department.  Virtual visits and telemedicine have been introduced since the pandemic started in 2020, and can be convenient ways to receive medical care.  We offer virtual appointments as well to assist you in a variety of options to seek medical care.   Advanced Directives: I recommend you consider completing a [place] and Living Will.   These documents respect your wishes and help alleviate burdens on your loved ones if you were to become terminally ill or be in a position to need those documents enforced.    You can complete Advanced Directives yourself, have them notarized, then have copies made for our office, for you and for anybody you feel should have them in safe keeping.  Or, you can have an attorney prepare these documents.   If you haven't updated your Last Will and Testament in a while, it may be worthwhile having an attorney prepare these documents together and save on some costs.      Specific Concerns: Memory concern - referral to neurology for consult.  Her husband handles the household.  She is still able to take care of a lot of her ADLs but he handles the  driving.  She does not drive.  They do handle finances  together today.  Based on my prior visits with them her memory has declined some in recent years.  Lichen rash - lets try 10-14 days of Triamcinoline cream, steroid cream.   Lets see if this will calm down the rash.   Use this for up to 2 weeks at a time.   If rash improves a lot within 1-2 weeks, then go back to your daliy Gotucream for moisturizing  Hypertension-continue current medication  Rosilyn was seen today for annual exam.  Diagnoses and all orders for this visit:  Encounter for health maintenance examination in adult -     Comprehensive metabolic panel -     CBC -     Lipid panel -     POCT Urinalysis DIP (Proadvantage Device) -     DG Bone Density; Future -     EKG 12-Lead  Medicare annual wellness visit, subsequent  Postmenopausal estrogen deficiency -     DG Bone Density; Future  Screening for heart disease -     EKG 12-Lead  Anemia of chronic disease  Essential hypertension, benign  History of renal stone  Leukocytosis, unspecified type  Screening for lipid disorders -     Lipid panel  Lichen planus  Memory change -     Ambulatory referral to Neurology  Other orders -     triamcinolone cream (KENALOG) 0.1 %; Apply 1 Application topically 2 (two) times daily. -     losartan-hydrochlorothiazide (HYZAAR) 100-12.5 MG tablet; Take 1 tablet by mouth daily.    Medicare Attestation A preventative services visit was completed today.  During the course of the visit the patient was educated and counseled about appropriate screening and preventive services.  A health risk assessment was established with the patient that included a review of current medications, allergies, social history, family history, medical and preventative health history, biometrics, and preventative screenings to identify potential safety concerns or impairments.  A personalized plan was printed today for the patient's records and use.    Personalized health advice and education was given today to reduce health risks and promote self management and wellness.  Information regarding end of life planning was discussed today.  Dorothea Ogle, PA-C   01/21/2022

## 2022-01-21 NOTE — Patient Instructions (Signed)
This visit was a preventative care visit, also known as wellness visit or routine physical.   Topics typically include healthy lifestyle, diet, exercise, preventative care, vaccinations, sick and well care, proper use of emergency dept and after hours care, as well as other concerns.     Recommendations: Continue to return yearly for your annual wellness and preventative care visits.  This gives [place] a chance to discuss healthy lifestyle, exercise, vaccinations, review your chart record, and perform screenings where appropriate.  I recommend you see your eye doctor yearly for routine vision care.  I recommend you see your dentist yearly for routine dental care including hygiene visits twice yearly.   Vaccination recommendations were reviewed Immunization History  Administered Date(s) Administered   Influenza, High Dose Seasonal PF 07/14/2017   PFIZER Comirnaty(Gray Top)Covid-19 Tri-Sucrose Vaccine 11/03/2020   PFIZER(Purple Top)SARS-COV-2 Vaccination 10/10/2019, 11/04/2019, 05/16/2020   Pneumococcal Conjugate-13 11/03/2020   Tdap 06/15/2013    Get your shingles vaccine at the pharmacy soon.  2 shots 2 months apart.   Screening for cancer: Colon cancer screening: I reviewed your colonoscopy on file that is up to date from 10/2021  Breast cancer screening: You declined future screening as of this date  Cervical cancer screening: Declined further paps.  Last pap from 2016 reviewed which was normal.   Skin cancer screening: Check your skin regularly for new changes, growing lesions, or other lesions of concern Come in for evaluation if you have skin lesions of concern.  Lung cancer screening: If you have a greater than 20 pack year history of tobacco use, then you may qualify for lung cancer screening with a chest CT scan.   Please call your insurance company to inquire about coverage for this test.  We currently don't have screenings for other cancers besides breast, cervical,  colon, and lung cancers.  If you have a strong family history of cancer or have other cancer screening concerns, please let me know.    Bone health: Get at least 150 minutes of aerobic exercise weekly Get weight bearing exercise at least once weekly Bone density test:  A bone density test is an imaging test that uses a type of X-ray to measure the amount of calcium and other minerals in your bones. The test may be used to diagnose or screen you for a condition that causes weak or thin bones (osteoporosis), predict your risk for a broken bone (fracture), or determine how well your osteoporosis treatment is working. The bone density test is recommended for females 68 and older, or females or males <25 if certain risk factors such as thyroid disease, long term use of steroids such as for asthma or rheumatological issues, vitamin D deficiency, estrogen deficiency, family history of osteoporosis, self or family history of fragility fracture in first degree relative.  Please call to schedule your bone density test for osteoporosis screening..   [place]  [phone] [place]. [address], [place], [place] [address]   Heart health: Get at least 150 minutes of aerobic exercise weekly Limit alcohol It is important to maintain a healthy blood pressure and healthy cholesterol numbers  Heart disease screening: Screening for heart disease includes screening for blood pressure, fasting lipids, glucose/diabetes screening, BMI height to weight ratio, reviewed of smoking status, physical activity, and diet.    Goals include blood pressure 120/80 or less, maintaining a healthy lipid/cholesterol profile, preventing diabetes or keeping diabetes numbers under good control, not smoking or using tobacco products, exercising  most days per week or at least 150 minutes per week of exercise, and eating healthy variety of fruits and vegetables, healthy oils, and avoiding unhealthy  food choices like fried food, fast food, high sugar and high cholesterol foods.    EKG reviewed today looks good.   If you want any additional testing such as consult with cardiology let me know.    Medical care options: I recommend you continue to seek care here first for routine care.  We try really hard to have available appointments Monday through Friday daytime hours for sick visits, acute visits, and physicals.  Urgent care should be used for after hours and weekends for significant issues that cannot wait till the next day.  The emergency department should be used for significant potentially life-threatening emergencies.  The emergency department is expensive, can often have long wait times for less significant concerns, so try to utilize primary care, urgent care, or telemedicine when possible to avoid unnecessary trips to the emergency department.  Virtual visits and telemedicine have been introduced since the pandemic started in 2020, and can be convenient ways to receive medical care.  We offer virtual appointments as well to assist you in a variety of options to seek medical care.   Advanced Directives: I recommend you consider completing a [place] and Living Will.   These documents respect your wishes and help alleviate burdens on your loved ones if you were to become terminally ill or be in a position to need those documents enforced.    You can complete Advanced Directives yourself, have them notarized, then have copies made for our office, for you and for anybody you feel should have them in safe keeping.  Or, you can have an attorney prepare these documents.   If you haven't updated your Last Will and Testament in a while, it may be worthwhile having an attorney prepare these documents together and save on some costs.      Specific Concerns: Memory concern - referral to neurology for consult  Lichen rash - lets try 10-14 days of Triamcinoline cream, steroid  cream.   Lets see if this will calm down the rash.   Use this for up to 2 weeks at a time.   If rash improves a lot within 1-2 weeks, then go back to your daliy Gotucream for moisturizing

## 2022-01-22 ENCOUNTER — Other Ambulatory Visit: Payer: Self-pay | Admitting: Medical

## 2022-01-22 ENCOUNTER — Encounter: Payer: Self-pay | Admitting: Internal Medicine

## 2022-01-22 LAB — COMPREHENSIVE METABOLIC PANEL
ALT: 39 IU/L — ABNORMAL HIGH (ref 0–32)
AST: 42 IU/L — ABNORMAL HIGH (ref 0–40)
Albumin/Globulin Ratio: 1.5 (ref 1.2–2.2)
Albumin: 4.7 g/dL (ref 3.8–4.8)
Alkaline Phosphatase: 126 IU/L — ABNORMAL HIGH (ref 44–121)
BUN/Creatinine Ratio: 21 (ref 12–28)
BUN: 22 mg/dL (ref 8–27)
Bilirubin Total: 0.4 mg/dL (ref 0.0–1.2)
CO2: 20 mmol/L (ref 20–29)
Calcium: 9.9 mg/dL (ref 8.7–10.3)
Chloride: 100 mmol/L (ref 96–106)
Creatinine, Ser: 1.03 mg/dL — ABNORMAL HIGH (ref 0.57–1.00)
Globulin, Total: 3.1 g/dL (ref 1.5–4.5)
Glucose: 88 mg/dL (ref 70–99)
Potassium: 4.4 mmol/L (ref 3.5–5.2)
Sodium: 141 mmol/L (ref 134–144)
Total Protein: 7.8 g/dL (ref 6.0–8.5)
eGFR: 58 mL/min/{1.73_m2} — ABNORMAL LOW (ref >59)

## 2022-01-22 LAB — CBC
Hematocrit: 42 % (ref 34.0–46.6)
Hemoglobin: 14.1 g/dL (ref 11.1–15.9)
MCH: 30.2 pg (ref 26.6–33.0)
MCHC: 33.6 g/dL (ref 31.5–35.7)
MCV: 90 fL (ref 79–97)
Platelets: 229 10*3/uL (ref 150–450)
RBC: 4.67 x10E6/uL (ref 3.77–5.28)
RDW: 11.8 % (ref 11.7–15.4)
WBC: 9.5 10*3/uL (ref 3.4–10.8)

## 2022-01-22 LAB — LIPID PANEL
Chol/HDL Ratio: 5.5 ratio — ABNORMAL HIGH (ref 0.0–4.4)
Cholesterol, Total: 299 mg/dL — ABNORMAL HIGH (ref 100–199)
HDL: 54 mg/dL (ref >39)
LDL Chol Calc (NIH): 220 mg/dL — ABNORMAL HIGH (ref 0–99)
Triglycerides: 137 mg/dL (ref 0–149)
VLDL Cholesterol Cal: 25 mg/dL (ref 5–40)

## 2022-01-22 MED ORDER — ROSUVASTATIN CALCIUM 10 MG PO TABS: 10.0000 mg | ORAL_TABLET | Freq: Every day | ORAL | 3 refills | Status: DC

## 2022-01-22 MED ORDER — VITAMIN D 50 MCG (2000 UT) PO CAPS: 1.0000 | ORAL_CAPSULE | Freq: Every day | ORAL | 3 refills | Status: DC

## 2022-02-11 ENCOUNTER — Other Ambulatory Visit: Payer: Self-pay | Admitting: Medical

## 2022-02-11 ENCOUNTER — Telehealth: Payer: Self-pay | Admitting: Medical

## 2022-02-11 ENCOUNTER — Other Ambulatory Visit: Payer: No Typology Code available for payment source

## 2022-02-11 DIAGNOSIS — Z780 Asymptomatic menopausal state: Secondary | ICD-10-CM

## 2022-02-11 DIAGNOSIS — E2839 Other primary ovarian failure: Secondary | ICD-10-CM

## 2022-02-11 NOTE — Telephone Encounter (Signed)
Order will be faxed over and Dr. Redmond School will look over and sign. [place]

## 2022-02-11 NOTE — Telephone Encounter (Signed)
Alison Zimmerman called from the [facility] and states they need a bone density order signed for her. Call back number [phone], Fax number [phone].

## 2022-02-11 NOTE — Telephone Encounter (Signed)
Done

## 2022-03-08 ENCOUNTER — Ambulatory Visit: Payer: Medicare Other | Admitting: Psychiatry

## 2022-03-08 ENCOUNTER — Ambulatory Visit: Payer: No Typology Code available for payment source | Admitting: Medical

## 2022-03-10 ENCOUNTER — Telehealth: Payer: Self-pay | Admitting: Medical

## 2022-03-10 NOTE — Telephone Encounter (Signed)
Please Send no show letter to patient and call to find out why they missed.   We have a large number of patients needing appointments, and when someone no shows, this prevents others from being able to use those appointment slots!

## 2022-03-11 ENCOUNTER — Ambulatory Visit (INDEPENDENT_AMBULATORY_CARE_PROVIDER_SITE_OTHER): Payer: No Typology Code available for payment source | Admitting: Neurology

## 2022-03-11 ENCOUNTER — Encounter: Payer: Self-pay | Admitting: Neurology

## 2022-03-11 VITALS — BP 132/70 | HR 74 | Ht 62.0 in | Wt 136.8 lb

## 2022-03-11 DIAGNOSIS — R413 Other amnesia: Secondary | ICD-10-CM | POA: Diagnosis not present

## 2022-03-11 DIAGNOSIS — E538 Deficiency of other specified B group vitamins: Secondary | ICD-10-CM | POA: Diagnosis not present

## 2022-03-11 DIAGNOSIS — E519 Thiamine deficiency, unspecified: Secondary | ICD-10-CM

## 2022-03-11 DIAGNOSIS — I679 Cerebrovascular disease, unspecified: Secondary | ICD-10-CM

## 2022-03-11 DIAGNOSIS — R5383 Other fatigue: Secondary | ICD-10-CM

## 2022-03-11 DIAGNOSIS — F03A18 Unspecified dementia, mild, with other behavioral disturbance: Secondary | ICD-10-CM

## 2022-03-11 NOTE — Patient Instructions (Signed)
Blood work MRI brain - will call Formal memory testing - Dr Alphonzo Severance or Dr. Quita Skye Mcdermott - will call Medications for dementia: Donepezil, Memantine and new Lecanumab which we can discuss after workup  Memory Compensation Strategies  Use "WARM" strategy.  W= write it down  A= associate it  R= repeat it  M= make a mental note  2.   You can keep a Social worker.  Use a 3-ring notebook with sections for the following: calendar, important names and phone numbers,  medications, doctors' names/phone numbers, lists/reminders, and a section to journal what you did  each day.   3.    Use a calendar to write appointments down.  4.    Write yourself a schedule for the day.  This can be placed on the calendar or in a separate section of the Memory Notebook.  Keeping a  regular schedule can help memory.  5.    Use medication organizer with sections for each day or morning/evening pills.  You may need help loading it  6.    Keep a basket, or pegboard by the door.  Place items that you need to take out with you in the basket or on the pegboard.  You may also want to  include a message board for reminders.  7.    Use sticky notes.  Place sticky notes with reminders in a place where the task is performed.  For example: " turn off the  stove" placed by the stove, "lock the door" placed on the door at eye level, " take your medications" on  the bathroom mirror or by the place where you normally take your medications.  8.    Use alarms/timers.  Use while cooking to remind yourself to check on food or as a reminder to take your medicine, or as a  reminder to make a call, or as a reminder to perform another task, etc.

## 2022-03-11 NOTE — Progress Notes (Signed)
[facility]    Provider:  Dr Jaynee Eagles Requesting Provider: Glade Lloyd Camelia Zimmerman, Alison Zimmerman Primary Care Provider:  Carlena Hurl, Alison Zimmerman  CC:  memory concerns  HPI:  Alison Zimmerman is a 71 y.o. female here as requested by Alison Hurl, Alison Zimmerman for memory concerns.  I reviewed Alison Zimmerman's notes: Patient has concerns about her memory, she is using some herbal supplements for this, using Alison Zimmerman naturals for mental clarity, information retention and Alison Zimmerman.  Patient was referred to neurology for further evaluation.  She has a past medical history of essential hypertension, ductal carcinoma in situ of right breast, history of renal stone, leukocytosis, anemia of chronic disease, chronic cough, postmenopausal estrogen deficiency, memory changes, allergies, history of sepsis, migraines.    She is here with her husband. She has vertigo that affects her, it is frustrating she can;t remember. It takes time to recall things, but eventually she will remember, if she goes in another room and forgets what she went in there for, started noticing these changes 3 years ago, feels it has slowly progressive, it makes her so frustrated, she has never gotten lost, no accidents in the car, she doesn't drive a lot though she is a passenger mostly. Nothing happened, husband just drives more and she has been taking care of her mother who had dementia she dies in her 74s in Aug 07, 2017. Patient retired in Aug 07, 2001 from [employer]. 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 Husband provides much information, he does not think she has dementia, patient experienced the brunt of many years of caregiver burnout and then covid. Patient has had a hard time. MMSE has not changed in 2 years. She never had any grief counseling. She  takes some herbal supplements for her memory. Patient has asked the same questions in the same day, daughter has noticed, patient's mother started exhibiting memory loss in her mid 25s. She still performs all ADLS and most IADLS but she has paid bills twice and had late payments so the paying of the bills has been taken on by the husband. Daughter lives with them and has noticed. She is less social, doesn't want to go bowling or do the things she used to but being with family is a high point for her and she still wants to spend time with her family. 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 No other focal neurologic deficits, associated symptoms, inciting events or modifiable factors.  Reviewed notes, labs and imaging from outside physicians, which showed:  01/21/2022: cbc nml, cmp BUN 22, creat 1.03, egfr 58, alkp 126, ast 42, alt 39 otherwise nml  MRI brain 05/2015: FINDINGS: Mild periventricular white matter changes bilaterally are slightly greater than expected for age. No acute infarct, hemorrhage, or mass lesion is present. The ventricles are of normal size. No significant extraaxial fluid collection is present.   The internal auditory canals are within normal limits. The brainstem and cerebellum are normal. Flow is present in the major intracranial arteries. The globes and orbits are intact. Mild mucosal thickening is present in the anterior ethmoid air cells bilaterally. There is mucosal thickening along the floor of the left greater than right maxillary sinus. The remaining paranasal sinuses and the mastoid air cells are clear.   The skullbase is within normal  limits. Midline sagittal images are unremarkable.   IMPRESSION: 1. No acute or focal intracranial abnormality to explain the patient's headaches. 2. Mild periventricular T2 changes are slightly greater than expected for age. The finding is nonspecific but can be seen in the setting of chronic microvascular ischemia, a  demyelinating process such as multiple sclerosis, vasculitis, complicated migraine headaches, or as the sequelae of a prior infectious or inflammatory process. 3. Mild mucosal thickening in the anterior ethmoid air cells bilaterally and along the floor of the maxillary sinuses, left greater than right.  Review of Systems: Patient complains of symptoms per HPI as well as the following symptoms memory loss. Pertinent negatives and positives per HPI. All others negative.   Social History   Socioeconomic History   Marital status: Married    Spouse name: Not on file   Number of children: Not on file   Years of education: Not on file   Highest education level: Not on file  Occupational History   Not on file  Tobacco Use   Smoking status: Never   Smokeless tobacco: Never  Vaping Use   Vaping Use: Never used  Substance and Sexual Activity   Alcohol use: No   Drug use: Never   Sexual activity: Yes    Birth control/protection: Post-menopausal  Other Topics Concern   Not on file  Social History Narrative   Married, has 2 daughters, 3 children.  Exercise - low impact water aerobics, retired, use to work for [employer];     Social Determinants of Health   Financial Resource Strain: Not on file  Food Insecurity: Not on file  Transportation Needs: Not on file  Physical Activity: Not on file  Stress: Not on file  Social Connections: Not on file  Intimate Partner Violence: Not on file    Family History  Problem Relation Age of Onset   Stroke Mother    Dementia Mother    Stroke Father    Cancer Neg Hx    Diabetes Neg Hx    Heart disease Neg Hx    Hypertension Neg Hx    Colon cancer Neg Hx    Esophageal cancer Neg Hx    Inflammatory bowel disease Neg Hx    Liver disease Neg Hx    Pancreatic cancer Neg Hx    Rectal cancer Neg Hx    Stomach cancer Neg Hx     Past Medical History:  Diagnosis Date   Allergy    honeydew melon   Anemia    Ductal carcinoma in situ of breast dx  08/17/13--  oncologist-  dr Humphrey Rolls   Stage 0 (Tis, Nx)  High Grade DCIS right breast,  ER/ PR + --  per pt on 06-10-2014  states juicing w/ fresh fruit and vegatable's for natural treatment at this time   History of sepsis    admitted 05-16-2015  w/ urosepsis/  acute renal failure due to pyelonephritis / stone obstruction   Hypertension    Kidney stone    Migraine    Right ureteral stone    Wears glasses     Patient Active Problem List   Diagnosis Date Noted   Encounter for health maintenance examination in adult 01/21/2022   Postmenopausal estrogen deficiency 01/21/2022   Screening for lipid disorders 60/73/7106   Lichen planus 26/94/8546   Memory change 01/21/2022   Medicare annual wellness visit, subsequent 01/21/2022   Screening for heart disease 01/21/2022   Bunion of great toe of left foot 06/12/2021  Essential hypertension, benign 06/12/2021   Loose stools 07/02/2020   Chronic cough 07/02/2020   History of renal stone 05/18/2015   Leukocytosis 05/18/2015   Anemia of chronic disease 05/18/2015   Ductal carcinoma in situ (DCIS) of right breast 05/16/2015    Past Surgical History:  Procedure Laterality Date   BUNIONECTOMY Left 05/31/1998   COLONOSCOPY  11/17/2021   CYSTOSCOPY WITH RETROGRADE PYELOGRAM, URETEROSCOPY AND STENT PLACEMENT Right 06/16/2015   Procedure: CYSTOSCOPY WITH RIGHT RETROGRADE PYELOGRAM, URETEROSCOPY AND STENT EXCHANGE;  Surgeon: Nickie Retort, MD;  Location: [facility];  Service: Urology;  Laterality: Right;   CYSTOSCOPY WITH STENT PLACEMENT Right 05/17/2015   Procedure: CYSTOSCOPY, RETROGRADE PYELOGRAM WITH STENT PLACEMENT;  Surgeon: Nickie Retort, MD;  Location: [facility];  Service: Urology;  Laterality: Right;   HOLMIUM LASER APPLICATION Right 25/85/2778   Procedure: HOLMIUM LASER APPLICATION;  Surgeon: Nickie Retort, MD;  Location: [facility];  Service: Urology;  Laterality: Right;   ORIF FEMUR FRACTURE  Left age 41   STONE EXTRACTION WITH BASKET Right 06/16/2015   Procedure: STONE EXTRACTION WITH BASKET;  Surgeon: Nickie Retort, MD;  Location: [facility];  Service: Urology;  Laterality: Right;   TUBAL LIGATION  yrs ago    Current Outpatient Medications  Medication Sig Dispense Refill   CALCIUM PO Take by mouth.     Cholecalciferol (VITAMIN D) 50 MCG (2000 UT) CAPS Take 1 capsule (2,000 Units total) by mouth daily. 90 capsule 3   diphenhydrAMINE-Acetaminophen (PERCOGESIC PO) Take by mouth.     losartan-hydrochlorothiazide (HYZAAR) 100-12.5 MG tablet Take 1 tablet by mouth daily. 90 tablet 3   Misc Natural Products (MAXIMUM MEMORY PO) Take 1 each by mouth daily.     rosuvastatin (CRESTOR) 10 MG tablet Take 1 tablet (10 mg total) by mouth daily. 90 tablet 3   triamcinolone cream (KENALOG) 0.1 % Apply 1 Application topically 2 (two) times daily. 45 g 0   No current facility-administered medications for this visit.    Allergies as of 03/11/2022 - Review Complete 03/11/2022  Allergen Reaction Noted   Other  05/16/2015    Vitals: BP 132/70   Pulse 74   Ht _0  (1.575 m)   Wt 136 lb 12.8 oz (62.1 kg)   LMP  (LMP Unknown)   BMI 25.02 kg/m  Last Weight:  Wt Readings from Last 1 Encounters:  03/11/22 136 lb 12.8 oz (62.1 kg)   Last Height:   Ht Readings from Last 1 Encounters:  03/11/22 _1  (1.575 m)     Physical exam: Exam: Gen: NAD, conversant, well nourised, well groomed                     CV: RRR, no MRG. No Carotid Bruits. No peripheral edema, warm, nontender Eyes: Conjunctivae clear without exudates or hemorrhage  Neuro: Detailed Neurologic Exam  Speech:    Speech is normal; fluent and spontaneous with normal comprehension.  Cognition:     03/11/2022   11:32 AM 04/04/2020    1:41 PM  MMSE - Mini Mental State Exam  Orientation to time 3 2  Orientation to Place 5 5  Registration 3 3  Attention/ Calculation 2 5  Recall 2 1  Language-  name 2 objects 2 2  Language- repeat 1 1  Language- follow 3 step command 3 3  Language- read & follow direction 1 1  Write a sentence 1 1  Copy design  1 0  Total score 24 24       The patient is oriented to person, place, and time;     recent and remote memory intact;     language fluent;     normal attention, concentration,     fund of knowledge Cranial Nerves:    The pupils are equal, round, and reactive to light. Attempted, pupils too small to visualize fundi. Visual fields are full to finger confrontation. Extraocular movements are intact. Trigeminal sensation is intact and the muscles of mastication are normal. The face is symmetric. The palate elevates in the midline. Hearing intact. Voice is normal. Shoulder shrug is normal. The tongue has normal motion without fasciculations.   Coordination:    Normal   Gait:   are normal.   Motor Observation:    No asymmetry, no atrophy, and no involuntary movements noted. Tone:    Normal muscle tone.    Posture:    Posture is normal. normal erect    Strength:    Strength is V/V in the upper and lower limbs.      Sensation: intact to LT     Reflex Exam:  DTR's:    Deep tendon reflexes in the upper and lower extremities are normal bilaterally.   Toes:    The toes are downgoing bilaterally.   Clonus:    Clonus is absent.    Assessment/Plan:  71 y.o. female here as requested by Alison Hurl, Alison Zimmerman for memory concerns.  I reviewed Alison Zimmerman's notes: Patient has concerns about her memory, she is using some herbal supplements for this, using Alison Zimmerman naturals for mental clarity, information retention and Alison Zimmerman.  Patient was referred to neurology for further evaluation.  She has a past medical history of essential hypertension, ductal carcinoma in situ of right breast, history of renal stone, leukocytosis, anemia of chronic disease, chronic cough, postmenopausal estrogen deficiency, memory  changes, allergies, history of sepsis, migraines.  Patient's MMSE hasn;t changed in 2 years and she is teary in the office, question pseudodementia will perform a thorough evaluation.  Blood work today MRI brain - will call to schedule Formal memory testing - Dr Alphonzo Severance or Dr. Quita Skye Mcdermott - they will call Medications for dementia discussed: Donepezil, Memantine and new Lecanumab which we can discuss after workup  Orders Placed This Encounter  Procedures   MR BRAIN W WO CONTRAST   B12 and Folate Panel   Methylmalonic acid, serum   Vitamin B1   RPR   Homocysteine   TSH Rfx on Abnormal to Free T4   Basic Metabolic Panel   Ambulatory referral to Neuropsychology   No orders of the defined types were placed in this encounter.   Cc: Zimmerman, Camelia Zimmerman, Alison Zimmerman,  Zimmerman, Camelia Zimmerman, Alison Zimmerman  Sarina Ill, MD  [facility] [address] [place], [place] [address]  Phone [phone] Fax [phone]

## 2022-03-15 ENCOUNTER — Encounter: Payer: Self-pay | Admitting: Neurology

## 2022-03-15 ENCOUNTER — Telehealth: Payer: Self-pay | Admitting: Neurology

## 2022-03-15 ENCOUNTER — Telehealth: Payer: Self-pay

## 2022-03-15 LAB — B12 AND FOLATE PANEL
Folate: 14.7 ng/mL (ref >3.0)
Vitamin B-12: 724 pg/mL (ref 232–1245)

## 2022-03-15 LAB — VITAMIN B1: Thiamine: 117.1 nmol/L (ref 66.5–200.0)

## 2022-03-15 LAB — METHYLMALONIC ACID, SERUM: Methylmalonic Acid: 153 nmol/L (ref 0–378)

## 2022-03-15 LAB — BASIC METABOLIC PANEL
BUN/Creatinine Ratio: 20 (ref 12–28)
BUN: 19 mg/dL (ref 8–27)
CO2: 25 mmol/L (ref 20–29)
Calcium: 9.6 mg/dL (ref 8.7–10.3)
Chloride: 105 mmol/L (ref 96–106)
Creatinine, Ser: 0.94 mg/dL (ref 0.57–1.00)
Glucose: 98 mg/dL (ref 70–99)
Potassium: 4 mmol/L (ref 3.5–5.2)
Sodium: 146 mmol/L — ABNORMAL HIGH (ref 134–144)
eGFR: 65 mL/min/{1.73_m2} (ref >59)

## 2022-03-15 LAB — RPR: RPR Ser Ql: NONREACTIVE

## 2022-03-15 LAB — HOMOCYSTEINE: Homocysteine: 11.7 umol/L (ref 0.0–19.2)

## 2022-03-15 LAB — TSH RFX ON ABNORMAL TO FREE T4: TSH: 0.653 u[IU]/mL (ref 0.450–4.500)

## 2022-03-15 NOTE — Telephone Encounter (Signed)
I called patient.  I advised her that her blood work was unremarkable.  Patient verbalized understanding of results.  Patient had no questions or concerns at this time.

## 2022-03-15 NOTE — Telephone Encounter (Signed)
-----   Message from Melvenia Beam, MD sent at 03/15/2022 12:21 PM EDT ----- Bloodwork unremarkable. Essentially normal thanks

## 2022-03-15 NOTE — Telephone Encounter (Signed)
Referral to Neuropsychology faxed to [place] to see Dr. Cathie Hoops McDermot or Dr. Alphonzo Severance. Phone: [phone], Fax: [phone]

## 2022-03-24 ENCOUNTER — Telehealth: Payer: Self-pay | Admitting: Neurology

## 2022-03-24 NOTE — Telephone Encounter (Signed)
medicare [place]: [id number] exp. 03/22/22-06/20/22 sent to [place]

## 2022-04-01 ENCOUNTER — Ambulatory Visit (HOSPITAL_COMMUNITY)
Admission: RE | Admit: 2022-04-01 | Discharge: 2022-04-01 | Disposition: A | Discharge status: Home / Self Care | Payer: No Typology Code available for payment source | Source: Ambulatory Visit | Attending: Neurology | Admitting: Neurology

## 2022-04-01 DIAGNOSIS — I679 Cerebrovascular disease, unspecified: Secondary | ICD-10-CM | POA: Insufficient documentation

## 2022-04-01 DIAGNOSIS — R5383 Other fatigue: Secondary | ICD-10-CM | POA: Insufficient documentation

## 2022-04-01 DIAGNOSIS — E538 Deficiency of other specified B group vitamins: Secondary | ICD-10-CM | POA: Insufficient documentation

## 2022-04-01 DIAGNOSIS — E519 Thiamine deficiency, unspecified: Secondary | ICD-10-CM | POA: Diagnosis present

## 2022-04-01 DIAGNOSIS — R413 Other amnesia: Secondary | ICD-10-CM | POA: Diagnosis present

## 2022-04-01 DIAGNOSIS — F03A18 Unspecified dementia, mild, with other behavioral disturbance: Secondary | ICD-10-CM | POA: Insufficient documentation

## 2022-04-01 MED ORDER — GADOBUTROL 1 MMOL/ML IV SOLN
6.0000 mL | Freq: Once | INTRAVENOUS | Status: AC | PRN
  Administered 2022-04-01: 6 mL via INTRAVENOUS

## 2022-04-05 ENCOUNTER — Telehealth: Payer: Self-pay | Admitting: *Deleted

## 2022-04-05 NOTE — Telephone Encounter (Signed)
Spoke to patient gave MRI results gave Dr Ferdinand Lango recommendations of Formal memory testing and MRI of cervical spine pt wants me to call back and discuss with husband  before she moves forward with testing

## 2022-04-05 NOTE — Telephone Encounter (Signed)
-----   Message from Melvenia Beam, MD sent at 04/05/2022 12:47 PM EST ----- MRI of the brain still shows some "white matter changes" that are not significantly progressed from 2016.  These are likely due to several different factors including aging and hypertension and sometimes genetics.  However since it has not significantly changed since 2016 I do not think this has anything to do with your perceived memory problems.  I recommend continuing with the plan that we have for the formal memory testing.  However I would recommend an MRI of the cervical spine, at C4-C5 in the neck it looks like there was a disc protrusion that might be impacting the spinal cord so we should probably check just to make sure everything looks good in the neck if she is amenable please let me know and we can order an MRI of the cervical spine without contrast with a diagnosis of cervical stenosis.  Thanks Dr. Lavell Anchors

## 2022-04-05 NOTE — Telephone Encounter (Signed)
Pt husband is calling. Stated he would like a call back from nurse.

## 2022-04-05 NOTE — Telephone Encounter (Addendum)
Spoke to patient and husband gave MRI brain  results  Pt and husband want to move forward with formal Memory testing and Mri cervical spine Husband states wants to see if Dr Jaynee Eagles can order a MRI spine also because patient is having lower back pain also. Informed patient and husband will forward request to Dr Jaynee Eagles to make her aware both expressed understanding   thanked me for calling

## 2022-04-06 NOTE — Telephone Encounter (Signed)
I ordered MRi brain and the formal neurocognitive testing. Unfortunately we didn;t talk about the low back so I can;t order it, insurance reasons sorry

## 2022-04-07 NOTE — Telephone Encounter (Signed)
I called the pt/husband back and the pt answered (husband not at home). I relayed the message to her from Dr Jaynee Eagles and she gave me her husband's number to call [phone]). I LVM for the husband to call me back at his convenience.

## 2022-04-12 NOTE — Telephone Encounter (Signed)
I called husband after we had not received follow up call back.   I spoke to husband of pt.  I relayed that pt was referred for memory issues.  We addressed this with MRI brain and neuropsych eval and labs.  If having issues with back pain, needs to have evaluation by pcp, and then if referred back to [place] or ortho then can be addressed then.  He verbalized understanding.

## 2022-04-13 ENCOUNTER — Ambulatory Visit (INDEPENDENT_AMBULATORY_CARE_PROVIDER_SITE_OTHER): Payer: No Typology Code available for payment source | Admitting: Medical

## 2022-04-13 ENCOUNTER — Ambulatory Visit
Admission: RE | Admit: 2022-04-13 | Discharge: 2022-04-13 | Disposition: A | Discharge status: Home / Self Care | Payer: No Typology Code available for payment source | Source: Ambulatory Visit | Attending: Medical | Admitting: Medical

## 2022-04-13 ENCOUNTER — Encounter: Payer: Self-pay | Admitting: Medical

## 2022-04-13 VITALS — BP 126/70 | HR 72 | Ht 62.5 in | Wt 137.4 lb

## 2022-04-13 DIAGNOSIS — R202 Paresthesia of skin: Secondary | ICD-10-CM

## 2022-04-13 DIAGNOSIS — R413 Other amnesia: Secondary | ICD-10-CM

## 2022-04-13 DIAGNOSIS — M545 Low back pain, unspecified: Secondary | ICD-10-CM | POA: Diagnosis not present

## 2022-04-13 DIAGNOSIS — G8929 Other chronic pain: Secondary | ICD-10-CM

## 2022-04-13 DIAGNOSIS — R200 Anesthesia of skin: Secondary | ICD-10-CM | POA: Diagnosis not present

## 2022-04-13 NOTE — Patient Instructions (Signed)
Please go to [facility] for your lumbar xray.   Their hours are 8am - 4:30 pm Monday - Friday.  Take your insurance card with you.  [facility] [phone]  [address], [address] [place], [place] [address]  [address] , [place] [address] 

## 2022-04-13 NOTE — Progress Notes (Signed)
Subjective:  Alison Zimmerman is a 71 y.o. female who presents for Chief Complaint  Patient presents with   Back Pain    Has had low back pain x 1 year (tailbone area) Has not had any imaging done and would like to possibly have something done.      Here for low back pain and pain down into tail bone for about a year.  Accompanied by husband.  Sometimes has a tingling in feet but no regular or consistent pain into thighs or leg.  No fever, no incontinence.   No saddle anesthia.   Uses Biofreeze for back and CBD ointment.  Pain is intermittent.  Not worse with any particular activity.  No recent injury or fall.  They are requesting MRI of the back.  Gets massage about once per month.  Gets  a lot of knots in upper back  Has been water aerobics in the past but no recent exercise.  Has seen neurology recently for murmur and has follow-up planned.  She had a recent brain MRI.  No other aggravating or relieving factors.    No other c/o.  The following portions of the patient's history were reviewed and updated as appropriate: allergies, current medications, past family history, past medical history, past social history, past surgical history and problem list.  ROS Otherwise as in subjective above  Objective: BP 126/70   Pulse 72   Ht 5' 2.5" (1.588 m)   Wt 137 lb 6.4 oz (62.3 kg)   LMP  (LMP Unknown)   BMI 24.73 kg/m   General appearance: alert, no distress, well developed, well nourished Abdomen: +bs, soft, non tender, non distended, no masses, no hepatomegaly, no splenomegaly Back: Nontender to palpation, range of motion somewhat limited with hip flexion but back extension is okay, no obvious scoliosis or deformity otherwise Legs with normal range of motion of hips, legs otherwise unremarkable nontender no obvious deformity Pulses: 2+ radial pulses, 2+ pedal pulses, normal cap refill Ext: no edema Normal heel and toe walk, DTRs blunted of lower legs but strength is normal,  sensation is normal    Assessment: Encounter Diagnoses  Name Primary?   Chronic bilateral low back pain without sciatica Yes   Numbness and tingling of both feet    Memory change      Plan: We discussed her chronic back pain concerns.  I suspect arthritis without other radicular issues.  I advised that an x-ray would be more appropriate initial study.  They were agreeable so we will send her for baseline x-ray.  We discussed the need for stretching and some type of regular exercise.  Pending x-ray we will likely refer to physical therapy.  She can continue topical muscle rubs as she has been doing.  Can use Tylenol as needed.  Limit NSAID use.  Memory changes-follow-up with neurology soon  [name] was seen today for back pain.  Diagnoses and all orders for this visit:  Chronic bilateral low back pain without sciatica -     DG Lumbar Spine Complete; Future  Numbness and tingling of both feet -     DG Lumbar Spine Complete; Future  Memory change    Follow up: Pending x-ray

## 2022-04-15 ENCOUNTER — Other Ambulatory Visit: Payer: Self-pay

## 2022-04-15 DIAGNOSIS — G8929 Other chronic pain: Secondary | ICD-10-CM

## 2022-05-04 ENCOUNTER — Telehealth: Payer: Self-pay | Admitting: Family Medicine

## 2022-05-04 NOTE — Telephone Encounter (Signed)
Husband called ( he is on HIPAA) wanted xray results, gave same.  Advised referral of PT to [facility] and gave him phone number.

## 2022-05-06 ENCOUNTER — Telehealth: Payer: Self-pay

## 2022-05-06 ENCOUNTER — Other Ambulatory Visit: Payer: Self-pay | Admitting: Medical

## 2022-05-06 DIAGNOSIS — G8929 Other chronic pain: Secondary | ICD-10-CM

## 2022-05-06 DIAGNOSIS — M5410 Radiculopathy, site unspecified: Secondary | ICD-10-CM

## 2022-05-06 MED ORDER — HYDROCODONE-ACETAMINOPHEN 5-325 MG PO TABS: 1.0000 | ORAL_TABLET | Freq: Two times a day (BID) | ORAL | 0 refills | Status: DC | PRN

## 2022-05-06 MED ORDER — MELOXICAM 7.5 MG PO TABS: 7.5000 mg | ORAL_TABLET | Freq: Every day | ORAL | 0 refills | Status: DC

## 2022-05-06 NOTE — Telephone Encounter (Signed)
Patient's husband Alison Zimmerman called asking about what patients last back x ray showed. I Re advised him of the results an message from Alison Zimmerman, Alison Zimmerman.  Alison Zimmerman states that patient has tried PT as recommended, but her pain has progressively worsened to the point that she is unable to walk or  lay down due to the pain. Patient is unable to do PT sessions due to excruciating pain. [place]line request that Alison Zimmerman call him back immediately to let him know what needs to be done to figure out what is causing the pain or what could be done for the pain. Patient has been taking Extra strength tylenol, but still has excruciating pain.   Call back number [phone]

## 2022-05-07 ENCOUNTER — Telehealth: Payer: Self-pay | Admitting: Medical

## 2022-05-07 NOTE — Telephone Encounter (Signed)
Sterlin called in and says Alison Zimmerman is in terrible pain. He asks for [name] to call him at [phone]. I did express [name] is with patients today and asked would he like to speak with a nurse but he refused. I told him I would send a message back.

## 2022-05-07 NOTE — Telephone Encounter (Signed)
Mr Rieger called back again He states she is in worse pain. Spoke to [place] and she states she has been taking Biofreeze and CBD oil, but still having pain. I asked if she was taking pain med and anti inflammatory Shane prescribed and she states she is and it is helping some but she is not getting much relief.  No incontinence issues or fever.  Limping when she walks but is wearing her back brace and that is helping  Discussed with [name] and advised them they can increase pain meds to 3 x day if needed. And advised can go to ER if they feel she is worsening or symptoms have changed. Mr Zou concerned about MRI appt , advised him that [name] has ordered and referral coordinator is working on it

## 2022-05-10 ENCOUNTER — Telehealth: Payer: Self-pay | Admitting: Medical

## 2022-05-10 NOTE — Telephone Encounter (Signed)
Pt's husband called states she is doing same sometimes worse  He has spoke to [place] abotu MRI and first appointment is 19th He wants orders put in for MRI to be STAT  Please call Mr Cihlar

## 2022-05-10 NOTE — Telephone Encounter (Signed)
Pt called again  states they probably could get in tonight with  [place]

## 2022-05-11 ENCOUNTER — Telehealth: Payer: Self-pay | Admitting: Family Medicine

## 2022-05-11 NOTE — Telephone Encounter (Signed)
Worked with Hoy Finlay at [facility] and she has pt an appt in the morning at 7:45. Sent insurance and demo to [place] fax

## 2022-05-11 NOTE — Telephone Encounter (Signed)
Alison Zimmerman with [facility] called and states [facility] needed [place] to call to change facility. After several attempts I spoke with Vickie at [facility] [phone] in [place] while keeping Alison Zimmerman on line. Gave new facility of [facility] [address], [place], [place] [address]   Carolin Coy  Case # [id number] NPI for [facility] [id number] Tax# for [facility] [id number]  Prior Auth # [id number]   date 05/10/22-08/08/22 approved  Pt aware [facility] aware Ins aware

## 2022-05-12 ENCOUNTER — Telehealth: Payer: Self-pay | Admitting: Medical

## 2022-05-12 NOTE — Telephone Encounter (Signed)
Pt husband called and says the hydrocodone isn't working for [employer] and she may need an injection. He also insists you give him a call.

## 2022-05-12 NOTE — Telephone Encounter (Signed)
I got her MRI results from [facility].  We worked really hard to make this happen as soon as possible  Her MRI shows a small disc bulge in the lumbar spine and some arthritis in general.  There is no obvious nerve being impinged  The next steps would be continue plan to see physical therapy as originally advised, continue the meloxicam short-term.  The hydrocodone is short-term only and not meant to be an ongoing prescription  How is the pain level today?

## 2022-05-12 NOTE — Telephone Encounter (Signed)
I called and advised patient and her husband of message below. Patient states her pain is unchanged and is about the same. She states the pain keeps her from sleeping at night.

## 2022-05-14 ENCOUNTER — Encounter: Payer: Self-pay | Admitting: Medical

## 2022-05-14 ENCOUNTER — Ambulatory Visit (INDEPENDENT_AMBULATORY_CARE_PROVIDER_SITE_OTHER): Payer: No Typology Code available for payment source | Admitting: Medical

## 2022-05-14 VITALS — BP 130/84 | HR 79 | Temp 98.5°F

## 2022-05-14 DIAGNOSIS — M545 Low back pain, unspecified: Secondary | ICD-10-CM

## 2022-05-14 DIAGNOSIS — R748 Abnormal levels of other serum enzymes: Secondary | ICD-10-CM | POA: Diagnosis not present

## 2022-05-14 DIAGNOSIS — M62838 Other muscle spasm: Secondary | ICD-10-CM

## 2022-05-14 DIAGNOSIS — E559 Vitamin D deficiency, unspecified: Secondary | ICD-10-CM

## 2022-05-14 LAB — POCT URINALYSIS DIP (PROADVANTAGE DEVICE)
Bilirubin, UA: NEGATIVE
Blood, UA: NEGATIVE
Glucose, UA: NEGATIVE mg/dL
Ketones, POC UA: NEGATIVE mg/dL
Nitrite, UA: NEGATIVE
Protein Ur, POC: NEGATIVE mg/dL
Specific Gravity, Urine: 1.015
Urobilinogen, Ur: 0.2
pH, UA: 6.5 (ref 5.0–8.0)

## 2022-05-14 MED ORDER — GABAPENTIN 100 MG PO CAPS: 100.0000 mg | ORAL_CAPSULE | Freq: Two times a day (BID) | ORAL | 0 refills | Status: DC

## 2022-05-14 MED ORDER — TIZANIDINE HCL 4 MG PO TABS: 4.0000 mg | ORAL_TABLET | Freq: Every evening | ORAL | 0 refills | Status: DC | PRN

## 2022-05-14 NOTE — Progress Notes (Signed)
Subjective:  Alison Zimmerman is a 71 y.o. female who presents for Chief Complaint  Patient presents with   other    F/u still hurting bad in her back showed a knot back there at the end of the tail bone, in a wheelchair today,    Here for recheck on back pain.  Accompanied by her husband.    She had an MRI of her lumbar spine on 05/12/2022 showing small disc herniation L1-2 without mass effect on thecal sac and mild degenerative changes in general.  At her most recent visit on 05/06/2022 she was having worsening pain, tingling in the feet but no consistent pain into the legs only occasional pain to the legs.  She had no fever or incontinence, no saddle anesthesia.  The pain in general is intermittent not worse with any particular activity.  No recent injury or fall.  She has been getting massages in general, was using Biofreeze and CBD ointment topically.  At her last visit hydrocodone was prescribed for worse pain, meloxicam 7.5 mg daily was prescribed, and they called back within days saying that was not really helping which led to the MRI being expedited.  Today they report ongoing pain right side close to tail bone.  No numbness, no tingling.  No incontinence of bowel or bladder.   No fever.   No difficulty with bowels, no constipation.  No urinary frequency or urgency.   No abdominal pain.   Standing and walking worsens the pain.    Canceled physical therapy yesterday as its too painful to even move or turn in the bed or pain getting out of the bed.  No other aggravating or relieving factors.    No other c/o.  The following portions of the patient's history were reviewed and updated as appropriate: allergies, current medications, past family history, past medical history, past social history, past surgical history and problem list.  ROS Otherwise as in subjective above  Objective: BP 130/84   Pulse 79   Temp 98.5 F (36.9 C)   LMP  (LMP Unknown)   SpO2 99%   General  appearance: alert, no distress, well developed, well nourished Abdomen: +bs, soft, non tender, non distended, no masses, no hepatomegaly, no splenomegaly Back: mild tenderness to palpation lumbar spine midline and right, pain with back extension and  flexion but ROM is mostly full, about 80% of normal, no obvious scoliosis or deformity otherwise Legs with normal range of motion of hips, legs otherwise unremarkable nontender no obvious deformity Pulses: 2+ radial pulses, 2+ pedal pulses, normal cap refill Ext: no edema Normal heel and toe walk, DTRs blunted of lower legs but strength is normal, sensation is normal Skin unremarkable   Assessment: Encounter Diagnoses  Name Primary?   Low back pain, unspecified back pain laterality, unspecified chronicity, unspecified whether sciatica present Yes   Muscle spasm    Alkaline phosphatase elevation    Vitamin D deficiency       Plan: I reviewed the recent MRI lumbar spine she had done recently.  05/12/22 IMPRESSION:  1. Small disc herniation at L1-2 extending inferiorly behind L2 without mass effect on the thecal sac.  2. Otherwise mild degenerative changes   Her pain seems out of proportion to the scan.  I also reviewed her recent lumbar spine x-ray  They specifically want referral to Alison Zimmerman provider at [facility].  I will put in the referral today at the request  I had previously referred  her to physical therapy but they canceled the appointment as she felt like she was in too much pain to be able to do physical therapy  Recommendations today: Begin gabapentin 100 mg twice a day to help with pain since she did not get relief with hydrocodone and meloxicam.  Caution as this medication can cause drowsiness.  If not improving over the next 2 weeks we can increase the dose, so call back if needed I prescribed a muscle relaxer called tizanidine.  Muscle laxer's can also make you sleepy but this may actually  benefit some of the muscle spasm.  Take 1 tablet or even 1/2 tablet at bedtime to see if this helps with some of the spasm and pain Continue to hydrate well throughout the day with water I do recommend some stretching and getting up out of the chair and moving around so you do not get stiff I am checking some urine additional labs today to help further evaluate your symptoms Continue the topical remedies you are using at home as that seems to be helping If any worse symptoms such as fever, blood in the urine or stool, incontinence of urine or stool, inability to even stand, then go to the emergency department    Alison Zimmerman was seen today for other.  Diagnoses and all orders for this visit:  Low back pain, unspecified back pain laterality, unspecified chronicity, unspecified whether sciatica present -     POCT Urinalysis DIP (Proadvantage Device) -     CBC with Differential/Platelet -     Ambulatory referral to Pain Clinic  Muscle spasm -     POCT Urinalysis DIP (Proadvantage Device) -     CBC with Differential/Platelet -     Ambulatory referral to Pain Clinic  Alkaline phosphatase elevation -     VITAMIN D 25 Hydroxy (Vit-D Deficiency, Fractures) -     Hepatic function panel  Vitamin D deficiency -     VITAMIN D 25 Hydroxy (Vit-D Deficiency, Fractures) -     Hepatic function panel  Other orders -     gabapentin (NEURONTIN) 100 MG capsule; Take 1 capsule (100 mg total) by mouth 2 (two) times daily. -     tiZANidine (ZANAFLEX) 4 MG tablet; Take 1 tablet (4 mg total) by mouth at bedtime as needed for muscle spasms.     Follow up: pending labs,referral

## 2022-05-14 NOTE — Patient Instructions (Signed)
I reviewed the recent MRI lumbar spine she had done recently.  05/12/22 IMPRESSION:  1. Small disc herniation at L1-2 extending inferiorly behind L2 without mass effect on the thecal sac.  2. Otherwise mild degenerative changes   Her pain seems out of proportion to the scan.  I also reviewed her recent lumbar spine x-ray  They specifically want referral to [name] provider at [facility].  I will put in the referral today at the request  I had previously referred her to physical therapy but they canceled the appointment as she felt like she was in too much pain to be able to do physical therapy  Recommendations today: Begin gabapentin 100 mg twice a day to help with pain since she did not get relief with hydrocodone and meloxicam.  Caution as this medication can cause drowsiness.  If not improving over the next 2 weeks we can increase the dose, so call back if needed I prescribed a muscle relaxer called tizanidine.  Muscle laxer's can also make you sleepy but this may actually benefit some of the muscle spasm.  Take 1 tablet or even 1/2 tablet at bedtime to see if this helps with some of the spasm and pain Continue to hydrate well throughout the day with water I do recommend some stretching and getting up out of the chair and moving around so you do not get stiff I am checking some urine additional labs today to help further evaluate your symptoms Continue the topical remedies you are using at home as that seems to be helping If any worse symptoms such as fever, blood in the urine or stool, incontinence of urine or stool, inability to even stand, then go to the emergency department

## 2022-05-15 LAB — HEPATIC FUNCTION PANEL
ALT: 44 IU/L — ABNORMAL HIGH (ref 0–32)
AST: 48 IU/L — ABNORMAL HIGH (ref 0–40)
Albumin: 4.3 g/dL (ref 3.8–4.8)
Alkaline Phosphatase: 98 IU/L (ref 44–121)
Bilirubin Total: <0.2 mg/dL (ref 0.0–1.2)
Bilirubin, Direct: <0.1 mg/dL (ref 0.00–0.40)
Total Protein: 7.2 g/dL (ref 6.0–8.5)

## 2022-05-15 LAB — CBC WITH DIFFERENTIAL/PLATELET
Basophils Absolute: 0 10*3/uL (ref 0.0–0.2)
Basos: 0 %
EOS (ABSOLUTE): 0.1 10*3/uL (ref 0.0–0.4)
Eos: 1 %
Hematocrit: 36.3 % (ref 34.0–46.6)
Hemoglobin: 12.4 g/dL (ref 11.1–15.9)
Immature Grans (Abs): 0 10*3/uL (ref 0.0–0.1)
Immature Granulocytes: 0 %
Lymphocytes Absolute: 1.5 10*3/uL (ref 0.7–3.1)
Lymphs: 21 %
MCH: 30.5 pg (ref 26.6–33.0)
MCHC: 34.2 g/dL (ref 31.5–35.7)
MCV: 89 fL (ref 79–97)
Monocytes Absolute: 0.3 10*3/uL (ref 0.1–0.9)
Monocytes: 4 %
Neutrophils Absolute: 5.3 10*3/uL (ref 1.4–7.0)
Neutrophils: 74 %
Platelets: 223 10*3/uL (ref 150–450)
RBC: 4.07 x10E6/uL (ref 3.77–5.28)
RDW: 12.1 % (ref 11.7–15.4)
WBC: 7.2 10*3/uL (ref 3.4–10.8)

## 2022-05-15 LAB — VITAMIN D 25 HYDROXY (VIT D DEFICIENCY, FRACTURES): Vit D, 25-Hydroxy: 45.9 ng/mL (ref 30.0–100.0)

## 2022-05-16 NOTE — Progress Notes (Signed)
Blood counts normal, vitamin D looks good, liver test still slightly elevated compared to prior.  See if the lab can add on hepatitis B surface antigen, hepatitis C antibody, iron level to further evaluate the elevated liver tests.   Continue plan for referral

## 2022-05-17 ENCOUNTER — Encounter: Payer: Self-pay | Admitting: Internal Medicine

## 2022-05-18 ENCOUNTER — Other Ambulatory Visit: Payer: Self-pay | Admitting: Medical

## 2022-05-18 ENCOUNTER — Other Ambulatory Visit: Payer: No Typology Code available for payment source

## 2022-05-20 NOTE — Progress Notes (Signed)
Why were the hepatitis labs canceled.  Please check with Kelya in lab. I still need these labs

## 2022-05-21 ENCOUNTER — Telehealth: Payer: Self-pay | Admitting: Medical

## 2022-05-21 DIAGNOSIS — R7989 Other specified abnormal findings of blood chemistry: Secondary | ICD-10-CM

## 2022-05-21 NOTE — Telephone Encounter (Signed)
Please call husband and have her drop by for nurse visit for labs.  I had ordered some labs in reference to recent elevated liver test but there was not enough blood at the lab from prior.  We will need to draw more blood for these 2 tests

## 2022-05-27 ENCOUNTER — Telehealth: Payer: Self-pay | Admitting: Medical

## 2022-05-27 ENCOUNTER — Ambulatory Visit (INDEPENDENT_AMBULATORY_CARE_PROVIDER_SITE_OTHER): Payer: No Typology Code available for payment source | Admitting: Medical

## 2022-05-27 ENCOUNTER — Encounter: Payer: Self-pay | Admitting: Medical

## 2022-05-27 VITALS — BP 112/70 | HR 93 | Ht 62.5 in | Wt 143.2 lb

## 2022-05-27 DIAGNOSIS — Z131 Encounter for screening for diabetes mellitus: Secondary | ICD-10-CM | POA: Diagnosis not present

## 2022-05-27 DIAGNOSIS — M5440 Lumbago with sciatica, unspecified side: Secondary | ICD-10-CM

## 2022-05-27 DIAGNOSIS — R7301 Impaired fasting glucose: Secondary | ICD-10-CM

## 2022-05-27 DIAGNOSIS — E785 Hyperlipidemia, unspecified: Secondary | ICD-10-CM

## 2022-05-27 DIAGNOSIS — E559 Vitamin D deficiency, unspecified: Secondary | ICD-10-CM

## 2022-05-27 DIAGNOSIS — M62838 Other muscle spasm: Secondary | ICD-10-CM

## 2022-05-27 DIAGNOSIS — E2839 Other primary ovarian failure: Secondary | ICD-10-CM

## 2022-05-27 DIAGNOSIS — R7989 Other specified abnormal findings of blood chemistry: Secondary | ICD-10-CM

## 2022-05-27 DIAGNOSIS — R937 Abnormal findings on diagnostic imaging of other parts of musculoskeletal system: Secondary | ICD-10-CM

## 2022-05-27 DIAGNOSIS — Z780 Asymptomatic menopausal state: Secondary | ICD-10-CM

## 2022-05-27 NOTE — Patient Instructions (Signed)
   Please call to schedule your bone density   [facility]  [phone] [address], [address] , [place] [address]   

## 2022-05-27 NOTE — Telephone Encounter (Signed)
Pt's husband stopped by window on the way out and asked for a parking placard form to be completed. I am sending back to be completed. Please call pt at [phone] when ready. Form was given to cma working with provider today.

## 2022-05-27 NOTE — Progress Notes (Signed)
Subjective:  Alison Zimmerman is a 71 y.o. female who presents for Chief Complaint  Patient presents with   Follow-up    Concerned about pre diabetes, thyroid testing, and lupus treatment. Possible orthopedic referral for back pain   Here with husband for f/u.  She saw Dr. Nyoka Zimmerman for pain, was adjusted on some of the medicaiton, but advised to see spine surgeon for consult.  Wants referral today.  She also plans to see physical therapist now than the pain has subsided a bit.  Hyperlipemia - compliant with statin restarted in 12/2021 but nonfasting this morning, ate breakfast.   Wants screening for diabetes.  Has been prediabetic on prior labs  She is compliant with vitamin D supplement  No other aggravating or relieving factors.    No other c/o.  Past Medical History:  Diagnosis Date   Allergy    honeydew melon   Anemia    Ductal carcinoma in situ of breast dx 08/17/13--  oncologist-  dr Alison Zimmerman   Stage 0 (Tis, Nx)  High Grade DCIS right breast,  ER/ PR + --  per pt on 06-10-2014  states juicing w/ fresh fruit and vegatable's for natural treatment at this time   History of sepsis    admitted 05-16-2015  w/ urosepsis/  acute renal failure due to pyelonephritis / stone obstruction   Hypertension    Kidney stone    Migraine    Right ureteral stone    Wears glasses    Current Outpatient Medications on File Prior to Visit  Medication Sig Dispense Refill   CALCIUM PO Take by mouth.     Cholecalciferol (VITAMIN D) 50 MCG (2000 UT) CAPS Take 1 capsule (2,000 Units total) by mouth daily. 90 capsule 3   cyclobenzaprine (FLEXERIL) 10 MG tablet Take 10 mg by mouth 3 (three) times daily.     diphenhydrAMINE-Acetaminophen (PERCOGESIC PO) Take by mouth.     gabapentin (NEURONTIN) 100 MG capsule Take 1 capsule (100 mg total) by mouth 2 (two) times daily. 60 capsule 0   losartan-hydrochlorothiazide (HYZAAR) 100-12.5 MG tablet Take 1 tablet by mouth daily. 90 tablet 3   Misc Natural Products  (MAXIMUM MEMORY PO) Take 1 each by mouth daily.     rosuvastatin (CRESTOR) 10 MG tablet Take 1 tablet (10 mg total) by mouth daily. 90 tablet 3   tiZANidine (ZANAFLEX) 4 MG tablet Take 1 tablet (4 mg total) by mouth at bedtime as needed for muscle spasms. 30 tablet 0   triamcinolone cream (KENALOG) 0.1 % Apply 1 Application topically 2 (two) times daily. 45 g 0   No current facility-administered medications on file prior to visit.     The following portions of the patient's history were reviewed and updated as appropriate: allergies, current medications, past family history, past medical history, past social history, past surgical history and problem list.  ROS Otherwise as in subjective above    Objective: BP 112/70   Pulse 93   Ht 5' 2.5" (1.588 m)   Wt 143 lb 3.2 oz (65 kg)   LMP  (LMP Unknown)   SpO2 98%   BMI 25.77 kg/m   General appearance: alert, no distress, well developed, well nourished    Assessment: Encounter Diagnoses  Name Primary?   Elevated LFTs Yes   Screening for diabetes mellitus    Hyperlipidemia, unspecified hyperlipidemia type    Vitamin D deficiency    Impaired fasting blood sugar    Impaired fasting glucose  Post-menopausal    Estrogen deficiency    Low back pain with sciatica, sciatica laterality unspecified, unspecified back pain laterality, unspecified chronicity    Muscle spasm    Abnormal MRI, lumbar spine      Plan: Elevated LFTs-recent iron test normal.  She is going to come back this afternoon for fasting labs so she does not have to get stuck twice.  We will check hepatitis B and C.  She will also have a hemoglobin A1c when she comes back for labs today for screening  Hyperlipidemia-return this afternoon for fasting lipid panel since starting back on Crestor in August 2023  Vitamin D deficiency improved on vitamin D per December 2023 lab.  Continue vitamin D supplement  Low back pain, abnormal MRI-she saw pain management  recently.  She would like to get ahead and have a consult with a back surgeon in case she may need surgery in the future.  Referral placed.  She plans to start physical therapy now.  She did not agree to this originally because her pain level is too high but she seems to do a little bit better now.  We will request recent records from pain management.  She had requested a specific referral to Dr. Nyoka Zimmerman with [facility].  We will request these records today  Postmenopausal estrogen deficiency-call and schedule bone density test   Alison Zimmerman was seen today for follow-up.  Diagnoses and all orders for this visit:  Elevated LFTs -     Hepatitis B surface antigen -     Hepatitis C antibody  Screening for diabetes mellitus -     Hemoglobin A1c  Hyperlipidemia, unspecified hyperlipidemia type -     Lipid panel  Vitamin D deficiency  Impaired fasting blood sugar  Impaired fasting glucose -     Hemoglobin A1c  Post-menopausal  Estrogen deficiency  Low back pain with sciatica, sciatica laterality unspecified, unspecified back pain laterality, unspecified chronicity -     AMB referral to orthopedics  Muscle spasm -     AMB referral to orthopedics  Abnormal MRI, lumbar spine -     AMB referral to orthopedics    Follow up: pending labs, referral

## 2022-05-28 ENCOUNTER — Other Ambulatory Visit: Payer: Medicare Other

## 2022-05-29 LAB — LIPID PANEL
Chol/HDL Ratio: 4.6 ratio — ABNORMAL HIGH (ref 0.0–4.4)
Cholesterol, Total: 269 mg/dL — ABNORMAL HIGH (ref 100–199)
HDL: 58 mg/dL (ref >39)
LDL Chol Calc (NIH): 181 mg/dL — ABNORMAL HIGH (ref 0–99)
Triglycerides: 162 mg/dL — ABNORMAL HIGH (ref 0–149)
VLDL Cholesterol Cal: 30 mg/dL (ref 5–40)

## 2022-05-29 LAB — HEMOGLOBIN A1C
Est. average glucose Bld gHb Est-mCnc: 148 mg/dL
Hgb A1c MFr Bld: 6.8 % — ABNORMAL HIGH (ref 4.8–5.6)

## 2022-05-29 LAB — HEPATITIS B SURFACE ANTIGEN: Hepatitis B Surface Ag: NEGATIVE

## 2022-05-29 LAB — HEPATITIS C ANTIBODY: Hep C Virus Ab: NONREACTIVE

## 2022-06-01 NOTE — Progress Notes (Signed)
Cholesterol numbers look no better at all.  Diabetes marker in the diabetic range at 6.8%.  Are you actually taking the Crestor daily?  If you are then we need to increase the dose as it is certainly not working  Unfortunately since the diabetes marker was in the diabetic range we will need to keep a closer watch on this.  Avoid sweets and high sugar foods, lets plan to follow-up in 3 months fasting

## 2022-06-03 ENCOUNTER — Other Ambulatory Visit: Payer: Self-pay | Admitting: Medical

## 2022-06-03 MED ORDER — ROSUVASTATIN CALCIUM 20 MG PO TABS: 20.0000 mg | ORAL_TABLET | Freq: Every day | ORAL | 3 refills | Status: DC

## 2022-06-04 ENCOUNTER — Telehealth: Payer: Self-pay | Admitting: Medical

## 2022-06-04 DIAGNOSIS — M5440 Lumbago with sciatica, unspecified side: Secondary | ICD-10-CM

## 2022-06-04 NOTE — Telephone Encounter (Signed)
Put in another referral to PT

## 2022-06-04 NOTE — Telephone Encounter (Signed)
Received a call from [facility]. They state they received a call from pt's husband wanting to set up PT for pt. Per rep the referral they had has expired and they would need a new one to start pt for this pt. If PT appropriate. Please send. [facility] at [phone].

## 2022-06-09 LAB — IRON: Iron: 65 ug/dL (ref 27–139)

## 2022-06-09 LAB — SPECIMEN STATUS REPORT

## 2022-06-09 LAB — HEPATITIS B SURFACE ANTIBODY, QUANTITATIVE

## 2022-06-09 LAB — HEPATITIS C ANTIBODY

## 2022-06-10 ENCOUNTER — Other Ambulatory Visit: Payer: Self-pay | Admitting: Medical

## 2022-06-10 NOTE — Telephone Encounter (Signed)
Is this okay to refill? 

## 2022-06-24 ENCOUNTER — Other Ambulatory Visit: Payer: Self-pay | Admitting: Medical

## 2022-07-14 ENCOUNTER — Other Ambulatory Visit: Payer: No Typology Code available for payment source

## 2022-08-03 ENCOUNTER — Other Ambulatory Visit: Payer: Self-pay | Admitting: Medical

## 2022-09-01 ENCOUNTER — Ambulatory Visit: Payer: Medicare Other | Admitting: Medical

## 2022-09-13 ENCOUNTER — Other Ambulatory Visit: Payer: Self-pay | Admitting: Medical

## 2022-10-05 ENCOUNTER — Ambulatory Visit (INDEPENDENT_AMBULATORY_CARE_PROVIDER_SITE_OTHER): Payer: No Typology Code available for payment source | Admitting: Medical

## 2022-10-05 ENCOUNTER — Encounter: Payer: Self-pay | Admitting: Medical

## 2022-10-05 VITALS — BP 138/78 | HR 94 | Ht 62.5 in | Wt 139.0 lb

## 2022-10-05 DIAGNOSIS — E785 Hyperlipidemia, unspecified: Secondary | ICD-10-CM

## 2022-10-05 DIAGNOSIS — E1169 Type 2 diabetes mellitus with other specified complication: Secondary | ICD-10-CM

## 2022-10-05 DIAGNOSIS — I10 Essential (primary) hypertension: Secondary | ICD-10-CM

## 2022-10-05 DIAGNOSIS — Z7185 Encounter for immunization safety counseling: Secondary | ICD-10-CM

## 2022-10-05 DIAGNOSIS — E119 Type 2 diabetes mellitus without complications: Secondary | ICD-10-CM | POA: Insufficient documentation

## 2022-10-05 DIAGNOSIS — Z1231 Encounter for screening mammogram for malignant neoplasm of breast: Secondary | ICD-10-CM

## 2022-10-05 DIAGNOSIS — Z780 Asymptomatic menopausal state: Secondary | ICD-10-CM | POA: Diagnosis not present

## 2022-10-05 NOTE — Patient Instructions (Addendum)
Vaccine counseling You are due for shingles vaccine, Prevnar 20 pneumonia vaccine Let me know if you want to get the Prevnar vaccine here You should get your shingles vaccine at the pharmacy    Please call to schedule your mammogram AND bone density test for osteoporosis screening.   [facility]  [phone] [address] [place], [place] [address]    We will call with lab results   I am concerned that you may not be taking your cholesterol pill.  You do not have that with you today even though this was restarted back in December   You have 2 separate bottles of your blood pressure pills, so make sure you are NOT duplicating and taking 2  these daily

## 2022-10-05 NOTE — Progress Notes (Signed)
Subjective: Chief Complaint  Patient presents with   Diabetes    Nonfasting med check. Still having back pain.    Here for med check.  In the room alone today whereas husband is usually in with her.  He was being seen today by Dr. Susann Givens here for his care.  Primary Care Provider Daisia Slomski, Kermit Balo, PA-C here for primary care   Current Health Care Team: Dentist Eye doctor Dr. Hadley Pen, urology Dr. Corliss Parish, GI Dr. Serena Croissant, hematology, oncology  Hypertension-compliant with losartan HCT 100/12.5 mg daily.  No recent chest pain or palpitations or edema  Chronic back pain-been seeing a back specialist, compliant with gabapentin 100 mg twice daily, meloxicam 7.5 mg daily  Hyperlipidemia-she does not have her Crestor pill bottle with her today.  She is not 100% sure she is taking this.  Of note when we called in December she reported at home that she was in fact taking the Crestor but it is not clear today  She is fasting for labs today  she takes vitamin D 2000 units daily   Past Medical History:  Diagnosis Date   Allergy    honeydew melon   Anemia    Ductal carcinoma in situ of breast dx 08/17/13--  oncologist-  dr Welton Flakes   Stage 0 (Tis, Nx)  High Grade DCIS right breast,  ER/ PR + --  per pt on 06-10-2014  states juicing w/ fresh fruit and vegatable's for natural treatment at this time   History of sepsis    admitted 05-16-2015  w/ urosepsis/  acute renal failure due to pyelonephritis / stone obstruction   Hypertension    Kidney stone    Migraine    Right ureteral stone    Wears glasses    Current Outpatient Medications on File Prior to Visit  Medication Sig Dispense Refill   CALCIUM PO Take by mouth.     Cholecalciferol (VITAMIN D) 50 MCG (2000 UT) CAPS Take 1 capsule (2,000 Units total) by mouth daily. 90 capsule 3   diphenhydrAMINE (BENADRYL) 50 MG capsule Take 50 mg by mouth daily.     gabapentin (NEURONTIN) 100 MG capsule Take 1 capsule (100 mg total)  by mouth 2 (two) times daily. 60 capsule 0   losartan-hydrochlorothiazide (HYZAAR) 100-12.5 MG tablet Take 1 tablet by mouth daily. 90 tablet 3   NONFORMULARY OR COMPOUNDED ITEM      NONFORMULARY OR COMPOUNDED ITEM Take 1 tablet by mouth daily.     diphenhydrAMINE-Acetaminophen (PERCOGESIC PO) Take by mouth. (Patient not taking: Reported on 10/05/2022)     meloxicam (MOBIC) 7.5 MG tablet Take 7.5 mg by mouth daily. (Patient not taking: Reported on 10/05/2022)     rosuvastatin (CRESTOR) 20 MG tablet Take 1 tablet (20 mg total) by mouth daily. (Patient not taking: Reported on 10/05/2022) 90 tablet 3   No current facility-administered medications on file prior to visit.   ROS as in subjective   Objective: BP 138/78   Pulse 94   Ht 5' 2.5" (1.588 m)   Wt 139 lb (63 kg)   LMP  (LMP Unknown)   SpO2 99%   BMI 25.02 kg/m   Gen: wd, wn, nad Heart regular rate and rhythm, normal S1-S2, no murmurs Lungs clear Pulses 2+ No edema of lower extremities  Diabetic Foot Exam - Simple   Simple Foot Form Diabetic Foot exam was performed with the following findings: Yes 10/05/2022 11:59 AM  Visual Inspection No deformities, no ulcerations, no  other skin breakdown bilaterally: Yes Sensation Testing Intact to touch and monofilament testing bilaterally: Yes Pulse Check Posterior Tibialis and Dorsalis pulse intact bilaterally: Yes Comments       Assessment: Encounter Diagnoses  Name Primary?   Type 2 diabetes mellitus with other specified complication, without long-term current use of insulin (HCC) Yes   Essential hypertension, benign    Postmenopausal estrogen deficiency    Hyperlipidemia associated with type 2 diabetes mellitus (HCC)    Vaccine counseling    Encounter for screening mammogram for malignant neoplasm of breast      Plan: Diabetes-diet controlled up to this point but labs today.  Continue to work on eating healthy and exercising regularly.  She is working doing Clinical biochemist  really right now  Hypertension-continue current medication  Hyperlipidemia-possibly noncompliant.  She has memory issues and does not have a pill bottle with her today.  They will check the pills at home to verify.  Routine labs today  Postmenopausal estrogen deficiency-I recommend she call and schedule bone density test  Screening for breast cancer-I recommend to go ahead and schedule mammogram and bone density test  Vaccine counseling-due for Prevnar 20 and Shingrix.  We discussed the vaccine recommendations.  She declines today.  Anzleigh was seen today for diabetes.  Diagnoses and all orders for this visit:  Type 2 diabetes mellitus with other specified complication, without long-term current use of insulin (HCC) -     Hemoglobin A1c -     Microalbumin/Creatinine Ratio, Urine  Essential hypertension, benign  Postmenopausal estrogen deficiency -     DG Bone Density; Future  Hyperlipidemia associated with type 2 diabetes mellitus (HCC) -     Lipid panel -     Comprehensive metabolic panel  Vaccine counseling  Encounter for screening mammogram for malignant neoplasm of breast -     MM DIGITAL SCREENING BILATERAL; Future    F/u pending labs

## 2022-10-06 ENCOUNTER — Other Ambulatory Visit: Payer: Self-pay | Admitting: Medical

## 2022-10-06 LAB — COMPREHENSIVE METABOLIC PANEL
ALT: 33 IU/L — ABNORMAL HIGH (ref 0–32)
AST: 32 IU/L (ref 0–40)
Albumin/Globulin Ratio: 1.5 (ref 1.2–2.2)
Albumin: 4.6 g/dL (ref 3.8–4.8)
Alkaline Phosphatase: 125 IU/L — ABNORMAL HIGH (ref 44–121)
BUN/Creatinine Ratio: 14 (ref 12–28)
BUN: 15 mg/dL (ref 8–27)
Bilirubin Total: 0.5 mg/dL (ref 0.0–1.2)
CO2: 25 mmol/L (ref 20–29)
Calcium: 10.1 mg/dL (ref 8.7–10.3)
Chloride: 102 mmol/L (ref 96–106)
Creatinine, Ser: 1.05 mg/dL — ABNORMAL HIGH (ref 0.57–1.00)
Globulin, Total: 3 g/dL (ref 1.5–4.5)
Glucose: 99 mg/dL (ref 70–99)
Potassium: 4.8 mmol/L (ref 3.5–5.2)
Sodium: 141 mmol/L (ref 134–144)
Total Protein: 7.6 g/dL (ref 6.0–8.5)
eGFR: 56 mL/min/{1.73_m2} — ABNORMAL LOW (ref >59)

## 2022-10-06 LAB — MICROALBUMIN / CREATININE URINE RATIO
Creatinine, Urine: 152.1 mg/dL
Microalb/Creat Ratio: 16 mg/g creat (ref 0–29)
Microalbumin, Urine: 24.6 ug/mL

## 2022-10-06 LAB — LIPID PANEL
Chol/HDL Ratio: 5.8 ratio — ABNORMAL HIGH (ref 0.0–4.4)
Cholesterol, Total: 315 mg/dL — ABNORMAL HIGH (ref 100–199)
HDL: 54 mg/dL (ref >39)
LDL Chol Calc (NIH): 231 mg/dL — ABNORMAL HIGH (ref 0–99)
Triglycerides: 157 mg/dL — ABNORMAL HIGH (ref 0–149)
VLDL Cholesterol Cal: 30 mg/dL (ref 5–40)

## 2022-10-06 LAB — HEMOGLOBIN A1C
Est. average glucose Bld gHb Est-mCnc: 154 mg/dL
Hgb A1c MFr Bld: 7 % — ABNORMAL HIGH (ref 4.8–5.6)

## 2022-10-06 MED ORDER — ROSUVASTATIN CALCIUM 20 MG PO TABS: 20.0000 mg | ORAL_TABLET | Freq: Every day | ORAL | 3 refills | Status: DC

## 2022-10-06 MED ORDER — METFORMIN HCL 500 MG PO TABS: 500.0000 mg | ORAL_TABLET | Freq: Every day | ORAL | 1 refills | Status: DC

## 2022-10-06 MED ORDER — VITAMIN D (ERGOCALCIFEROL) 1.25 MG (50000 UNIT) PO CAPS: 50000.0000 [IU] | ORAL_CAPSULE | ORAL | 3 refills | Status: DC

## 2022-10-06 MED ORDER — GABAPENTIN 100 MG PO CAPS: 100.0000 mg | ORAL_CAPSULE | Freq: Two times a day (BID) | ORAL | 1 refills | Status: DC

## 2022-10-06 NOTE — Progress Notes (Signed)
Please call and speak to husband has the wife his memory issues  She did not have her cholesterol medicine with her yesterday and her numbers show that she is not taking this.  I had sent medication in December for her to start for cholesterol but she did not have it with her yesterday and does not think she is taking it.   I am going to resend the cholesterol medicine and I recommend she take it every day as her cholesterol is very high putting her at risk for heart disease and stroke  Diabetes marker 7.0%, alkaline phosphatase elevated.  Rest of labs okay  Lets make the following changes: 1-begin cholesterol medicine Crestor 20 mg daily at bedtime 2-start vitamin D 2000 units daily and change to weekly 50,000 unit prescription 3-continue blood pressure medicine as usual losartan HCT 4-begin metformin once daily to help control blood sugars.  Take this in the morning  Recheck in 3 months fasting

## 2022-10-11 ENCOUNTER — Telehealth: Payer: Self-pay | Admitting: Medical

## 2022-10-11 NOTE — Telephone Encounter (Signed)
Can you call patient, she has questions about her bone density and mammo appointments.

## 2022-10-11 NOTE — Telephone Encounter (Signed)
Pt was notified and I answered her questioned

## 2023-01-27 ENCOUNTER — Ambulatory Visit: Payer: No Typology Code available for payment source | Admitting: Medical

## 2023-02-01 ENCOUNTER — Ambulatory Visit (INDEPENDENT_AMBULATORY_CARE_PROVIDER_SITE_OTHER): Payer: No Typology Code available for payment source | Admitting: Medical

## 2023-02-01 VITALS — BP 120/72 | HR 76 | Ht 63.0 in | Wt 134.8 lb

## 2023-02-01 DIAGNOSIS — Z0000 Encounter for general adult medical examination without abnormal findings: Secondary | ICD-10-CM

## 2023-02-01 DIAGNOSIS — Z7185 Encounter for immunization safety counseling: Secondary | ICD-10-CM

## 2023-02-01 DIAGNOSIS — E1369 Other specified diabetes mellitus with other specified complication: Secondary | ICD-10-CM | POA: Diagnosis not present

## 2023-02-01 DIAGNOSIS — Z780 Asymptomatic menopausal state: Secondary | ICD-10-CM | POA: Diagnosis not present

## 2023-02-01 DIAGNOSIS — R413 Other amnesia: Secondary | ICD-10-CM | POA: Diagnosis not present

## 2023-02-01 DIAGNOSIS — D638 Anemia in other chronic diseases classified elsewhere: Secondary | ICD-10-CM | POA: Diagnosis not present

## 2023-02-01 DIAGNOSIS — E559 Vitamin D deficiency, unspecified: Secondary | ICD-10-CM | POA: Insufficient documentation

## 2023-02-01 DIAGNOSIS — I10 Essential (primary) hypertension: Secondary | ICD-10-CM

## 2023-02-01 LAB — POCT URINALYSIS DIP (PROADVANTAGE DEVICE)
Bilirubin, UA: NEGATIVE
Blood, UA: NEGATIVE
Glucose, UA: NEGATIVE mg/dL
Ketones, POC UA: NEGATIVE mg/dL
Nitrite, UA: POSITIVE — AB
Protein Ur, POC: NEGATIVE mg/dL
Specific Gravity, Urine: 1.02
Urobilinogen, Ur: NEGATIVE
pH, UA: 6 (ref 5.0–8.0)

## 2023-02-01 NOTE — Patient Instructions (Signed)
This visit was a preventative care visit, also known as wellness visit or routine physical.   Topics typically include healthy lifestyle, diet, exercise, preventative care, vaccinations, sick and well care, proper use of emergency dept and after hours care, as well as other concerns.     Recommendations: Continue to return yearly for your annual wellness and preventative care visits.  This gives [place] a chance to discuss healthy lifestyle, exercise, vaccinations, review your chart record, and perform screenings where appropriate.  I recommend you see your eye doctor yearly for routine vision care.  I recommend you see your dentist yearly for routine dental care including hygiene visits twice yearly.   Vaccination recommendations were reviewed Immunization History  Administered Date(s) Administered   Influenza, High Dose Seasonal PF 07/14/2017   PFIZER Comirnaty(Gray Top)Covid-19 Tri-Sucrose Vaccine 11/03/2020   PFIZER(Purple Top)SARS-COV-2 Vaccination 10/10/2019, 11/04/2019, 05/16/2020   Pneumococcal Conjugate-13 11/03/2020   Tdap 06/15/2013   Vaccines recommended today include yearly flu shot, shingles vaccine, Prevnar 20 vaccine for pneumonia and you will be due for updated Tdap next year 2025.   Screening for cancer: Colon cancer screening: I reviewed your colonoscopy on file that is up to date from 10/2021.  Report shows repeat due in 10 years  Breast cancer screening: You declined future screening as of this date  Cervical cancer screening: Declined further paps.  Last pap from 2016 reviewed which was normal.   Skin cancer screening: Check your skin regularly for new changes, growing lesions, or other lesions of concern Come in for evaluation if you have skin lesions of concern.  Lung cancer screening: If you have a greater than 20 pack year history of tobacco use, then you may qualify for lung cancer screening with a chest CT scan.   Please call your insurance company to  inquire about coverage for this test.  We currently don't have screenings for other cancers besides breast, cervical, colon, and lung cancers.  If you have a strong family history of cancer or have other cancer screening concerns, please let me know.    Bone health: Get at least 150 minutes of aerobic exercise weekly Get weight bearing exercise at least once weekly Bone density test:  A bone density test is an imaging test that uses a type of X-ray to measure the amount of calcium and other minerals in your bones. The test may be used to diagnose or screen you for a condition that causes weak or thin bones (osteoporosis), predict your risk for a broken bone (fracture), or determine how well your osteoporosis treatment is working. The bone density test is recommended for females 65 and older, or females or males <65 if certain risk factors such as thyroid disease, long term use of steroids such as for asthma or rheumatological issues, vitamin D deficiency, estrogen deficiency, family history of osteoporosis, self or family history of fragility fracture in first degree relative.  Please call to schedule your bone density test for osteoporosis screening..   [facility]  [phone] [address] [place], [place] [address]   Heart health: Get at least 150 minutes of aerobic exercise weekly Limit alcohol It is important to maintain a healthy blood pressure and healthy cholesterol numbers  Heart disease screening: Screening for heart disease includes screening for blood pressure, fasting lipids, glucose/diabetes screening, BMI height to weight ratio, reviewed of smoking status, physical activity, and diet.    Goals include blood pressure 120/80 or less, maintaining a healthy  lipid/cholesterol profile, preventing diabetes or keeping diabetes numbers under good control, not smoking or using tobacco products, exercising most days per week or at least 150  minutes per week of exercise, and eating healthy variety of fruits and vegetables, healthy oils, and avoiding unhealthy food choices like fried food, fast food, high sugar and high cholesterol foods.     Medical care options: I recommend you continue to seek care here first for routine care.  We try really hard to have available appointments Monday through Friday daytime hours for sick visits, acute visits, and physicals.  Urgent care should be used for after hours and weekends for significant issues that cannot wait till the next day.  The emergency department should be used for significant potentially life-threatening emergencies.  The emergency department is expensive, can often have long wait times for less significant concerns, so try to utilize primary care, urgent care, or telemedicine when possible to avoid unnecessary trips to the emergency department.  Virtual visits and telemedicine have been introduced since the pandemic started in 2020, and can be convenient ways to receive medical care.  We offer virtual appointments as well to assist you in a variety of options to seek medical care.   Advanced Directives: I recommend you consider completing a Health Care Power of Attorney and Living Will.   These documents respect your wishes and help alleviate burdens on your loved ones if you were to become terminally ill or be in a position to need those documents enforced.    You can complete Advanced Directives yourself, have them notarized, then have copies made for our office, for you and for anybody you feel should have them in safe keeping.  Or, you can have an attorney prepare these documents.   If you haven't updated your Last Will and Testament in a while, it may be worthwhile having an attorney prepare these documents together and save on some costs.      Specific Concerns: I reviewed last neurology note from October 2023, at that time they discussed medications to help dementia including  amantadine, or others.  She was referred to brain MRI and formal memory testing.  MRI results reviewed.  I am not sure if she ever went back for formal memory testing.  I recommended she do a follow-up with neurology   Hypertension-continue current medication losartan/HCTZ 100/12.5 mg daily  Diabetes-updated labs today, continue metformin 500 mg daily  Vitamin D deficiency-continue vitamin D supplement 50,000 weekly  Hyperlipidemia-continue Crestor 20 mg daily.

## 2023-02-01 NOTE — Progress Notes (Signed)
Subjective:    Alison Zimmerman is a 72 y.o. female who presents for Preventative Services visit and chronic medical problems/med check visit.    Chief Complaint  Patient presents with   Annual Exam    Awv cpe, nonfasting, declines flu shot, declines eye exam    Primary Care Provider Elenna Spratling, Kermit Balo, PA-C here for primary care  Current Health Care Team: Dentist Eye doctor Dr. Hadley Pen, urology Dr. Corliss Parish, GI Dr. Serena Croissant, hematology, oncology  Here for routine well visit.  Her husband only accompanies her but he is being seen by another provider in the same office today.  She denies any current issues.  Exercise Current exercise habits: stretching.   Walks some   Depression Screen    02/01/2023    1:45 PM  Depression screen PHQ 2/9  Decreased Interest 0  Down, Depressed, Hopeless 0  PHQ - 2 Score 0    Activities of Daily Living Screen/Functional Status Survey Is the patient deaf or have difficulty hearing?: No Does the patient have difficulty seeing, even when wearing glasses/contacts?: No Does the patient have difficulty concentrating, remembering, or making decisions?: No Does the patient have difficulty walking or climbing stairs?: No Does the patient have difficulty dressing or bathing?: No Does the patient have difficulty doing errands alone such as visiting a doctor's office or shopping?: No  Can patient draw a clock face showing 3:15 oclock, no  Fall Risk Screen    02/01/2023    1:45 PM 10/05/2022   11:28 AM 01/21/2022    2:42 PM 06/12/2021    1:50 PM  Fall Risk   Falls in the past year? 0 0 0 0  Number falls in past yr: 0 0 0 0  Injury with Fall? 0 0 0 0  Risk for fall due to : No Fall Risks No Fall Risks No Fall Risks No Fall Risks  Follow up Falls evaluation completed Falls evaluation completed Falls evaluation completed Falls evaluation completed    Gait Assessment: Normal gait observed no   Past Medical History:   Diagnosis Date   Allergy    honeydew melon   Anemia    Ductal carcinoma in situ of breast dx 08/17/13--  oncologist-  dr Welton Flakes   Stage 0 (Tis, Nx)  High Grade DCIS right breast,  ER/ PR + --  per pt on 06-10-2014  states juicing w/ fresh fruit and vegatable's for natural treatment at this time   History of sepsis    admitted 05-16-2015  w/ urosepsis/  acute renal failure due to pyelonephritis / stone obstruction   Hypertension    Kidney stone    Migraine    Right ureteral stone    Wears glasses     Past Surgical History:  Procedure Laterality Date   BUNIONECTOMY Left 05/31/1998   COLONOSCOPY  11/17/2021   CYSTOSCOPY WITH RETROGRADE PYELOGRAM, URETEROSCOPY AND STENT PLACEMENT Right 06/16/2015   Procedure: CYSTOSCOPY WITH RIGHT RETROGRADE PYELOGRAM, URETEROSCOPY AND STENT EXCHANGE;  Surgeon: Hildred Laser, MD;  Location: [facility];  Service: Urology;  Laterality: Right;   CYSTOSCOPY WITH STENT PLACEMENT Right 05/17/2015   Procedure: CYSTOSCOPY, RETROGRADE PYELOGRAM WITH STENT PLACEMENT;  Surgeon: Hildred Laser, MD;  Location: [facility];  Service: Urology;  Laterality: Right;   HOLMIUM LASER APPLICATION Right 06/16/2015   Procedure: HOLMIUM LASER APPLICATION;  Surgeon: Hildred Laser, MD;  Location: [facility];  Service: Urology;  Laterality: Right;   ORIF FEMUR  FRACTURE Left age 10   STONE EXTRACTION WITH BASKET Right 06/16/2015   Procedure: STONE EXTRACTION WITH BASKET;  Surgeon: Hildred Laser, MD;  Location: [facility];  Service: Urology;  Laterality: Right;   TUBAL LIGATION  yrs ago    Social History   Socioeconomic History   Marital status: Married    Spouse name: Not on file   Number of children: Not on file   Years of education: Not on file   Highest education level: Not on file  Occupational History   Not on file  Tobacco Use   Smoking status: Never   Smokeless tobacco: Never  Vaping Use   Vaping status:  Never Used  Substance and Sexual Activity   Alcohol use: No   Drug use: Never   Sexual activity: Yes    Birth control/protection: Post-menopausal  Other Topics Concern   Not on file  Social History Narrative   Married, has 2 daughters, 3 children.  Exercise - low impact water aerobics, retired, use to work for [employer];     Social Determinants of Health   Financial Resource Strain: Not on file  Food Insecurity: Not on file  Transportation Needs: Not on file  Physical Activity: Not on file  Stress: Not on file  Social Connections: Unknown (05/11/2022)   [facility] from [facility], [facility]   Social Network    Social Network: Not on file  Intimate Partner Violence: Unknown (05/11/2022)   Received from [facility], [facility]   HITS    Physically Hurt: Not on file    Insult or Talk Down To: Not on file    Threaten Physical Harm: Not on file    Scream or Curse: Not on file    Family History  Problem Relation Age of Onset   Stroke Mother    Dementia Mother    Stroke Father    Cancer Neg Hx    Diabetes Neg Hx    Heart disease Neg Hx    Hypertension Neg Hx    Colon cancer Neg Hx    Esophageal cancer Neg Hx    Inflammatory bowel disease Neg Hx    Liver disease Neg Hx    Pancreatic cancer Neg Hx    Rectal cancer Neg Hx    Stomach cancer Neg Hx      Current Outpatient Medications:    CALCIUM PO, Take by mouth., Disp: , Rfl:    gabapentin (NEURONTIN) 100 MG capsule, Take 1 capsule (100 mg total) by mouth 2 (two) times daily., Disp: 180 capsule, Rfl: 1   losartan-hydrochlorothiazide (HYZAAR) 100-12.5 MG tablet, Take 1 tablet by mouth daily., Disp: 90 tablet, Rfl: 3   metFORMIN (GLUCOPHAGE) 500 MG tablet, Take 1 tablet (500 mg total) by mouth daily with breakfast., Disp: 90 tablet, Rfl: 1   NONFORMULARY OR COMPOUNDED ITEM, , Disp: , Rfl:    NONFORMULARY OR COMPOUNDED ITEM, Take 1 tablet by mouth daily., Disp: , Rfl:    rosuvastatin (CRESTOR) 20 MG tablet, Take 1  tablet (20 mg total) by mouth daily., Disp: 90 tablet, Rfl: 3   Vitamin D, Ergocalciferol, (DRISDOL) 1.25 MG (50000 UNIT) CAPS capsule, Take 1 capsule (50,000 Units total) by mouth every 7 (seven) days., Disp: 12 capsule, Rfl: 3   diphenhydrAMINE-Acetaminophen (PERCOGESIC PO), Take by mouth. (Patient not taking: Reported on 10/05/2022), Disp: , Rfl:   Allergies  Allergen Reactions   Other     No blood products. Pt is a Fish farm manager witness.  Honeydew--  hives    History reviewed: allergies, current medications, past family history, past medical history, past social history, past surgical history and problem list  Chronic issues discussed: Compliant with medication    Objective:     Biometrics BP 120/72   Pulse 76   Ht 5\' 3"  (1.6 m)   Wt 134 lb 12.8 oz (61.1 kg)   LMP  (LMP Unknown)   BMI 23.88 kg/m   Gen: wd, wn ,nad, African American female Skin: Unremarkable HEENT: normocephalic, sclerae anicteric, TMs pearly, nares patent, no discharge or erythema, pharynx normal Oral cavity: MMM, no lesions Neck: supple, no lymphadenopathy, no thyromegaly, no masses, no bruits Heart: RRR, normal S1, S2, no murmurs Lungs: CTA bilaterally, no wheezes, rhonchi, or rales Abdomen: +bs, soft, non tender, non distended, no masses, no hepatomegaly, no splenomegaly Musculoskeletal: nontender, no swelling, no obvious deformity Extremities: no edema, no cyanosis, no clubbing Pulses: 2+ symmetric, upper and lower extremities, normal cap refill Neurological: alert, CN2-12 intact, strength normal upper extremities and lower extremities, sensation normal throughout, alert and oriented x2, DTRs 2+ throughout, no cerebellar signs, gait normal Psychiatric: normal affect, behavior normal, pleasant  Breast/GYN/rectal-declined/deferred  Diabetic Foot Exam - Simple   Simple Foot Form Diabetic Foot exam was performed with the following findings: Yes 02/01/2023  2:20 PM  Visual Inspection No deformities, no  ulcerations, no other skin breakdown bilaterally: Yes Sensation Testing Intact to touch and monofilament testing bilaterally: Yes Pulse Check Posterior Tibialis and Dorsalis pulse intact bilaterally: Yes Comments       Assessment:   Encounter Diagnoses  Name Primary?   Routine general medical examination at a health care facility Yes   Vaccine counseling    Encounter for health maintenance examination in adult    Essential hypertension, benign    Memory change    Postmenopausal estrogen deficiency    Anemia of chronic disease    Other specified diabetes mellitus with other specified complication, without long-term current use of insulin (HCC)    Vitamin D deficiency      Plan:   This visit was a preventative care visit, also known as wellness visit or routine physical.   Topics typically include healthy lifestyle, diet, exercise, preventative care, vaccinations, sick and well care, proper use of emergency dept and after hours care, as well as other concerns.     Recommendations: Continue to return yearly for your annual wellness and preventative care visits.  This gives [place] a chance to discuss healthy lifestyle, exercise, vaccinations, review your chart record, and perform screenings where appropriate.  I recommend you see your eye doctor yearly for routine vision care.  I recommend you see your dentist yearly for routine dental care including hygiene visits twice yearly.   Vaccination recommendations were reviewed Immunization History  Administered Date(s) Administered   Influenza, High Dose Seasonal PF 07/14/2017   PFIZER Comirnaty(Gray Top)Covid-19 Tri-Sucrose Vaccine 11/03/2020   PFIZER(Purple Top)SARS-COV-2 Vaccination 10/10/2019, 11/04/2019, 05/16/2020   Pneumococcal Conjugate-13 11/03/2020   Tdap 06/15/2013   Vaccines recommended today include yearly flu shot, shingles vaccine, Prevnar 20 vaccine for pneumonia and you will be due for updated Tdap next year  2025.   Screening for cancer: Colon cancer screening: I reviewed your colonoscopy on file that is up to date from 10/2021.  Report shows repeat due in 10 years  Breast cancer screening: You declined future screening as of this date  Cervical cancer screening: Declined further paps.  Last pap from 2016 reviewed which was normal.  Skin cancer screening: Check your skin regularly for new changes, growing lesions, or other lesions of concern Come in for evaluation if you have skin lesions of concern.  Lung cancer screening: If you have a greater than 20 pack year history of tobacco use, then you may qualify for lung cancer screening with a chest CT scan.   Please call your insurance company to inquire about coverage for this test.  We currently don't have screenings for other cancers besides breast, cervical, colon, and lung cancers.  If you have a strong family history of cancer or have other cancer screening concerns, please let me know.    Bone health: Get at least 150 minutes of aerobic exercise weekly Get weight bearing exercise at least once weekly Bone density test:  A bone density test is an imaging test that uses a type of X-ray to measure the amount of calcium and other minerals in your bones. The test may be used to diagnose or screen you for a condition that causes weak or thin bones (osteoporosis), predict your risk for a broken bone (fracture), or determine how well your osteoporosis treatment is working. The bone density test is recommended for females 65 and older, or females or males <65 if certain risk factors such as thyroid disease, long term use of steroids such as for asthma or rheumatological issues, vitamin D deficiency, estrogen deficiency, family history of osteoporosis, self or family history of fragility fracture in first degree relative.  Please call to schedule your bone density test for osteoporosis screening..   [facility]   [phone] [address] [place], [place] [address]   Heart health: Get at least 150 minutes of aerobic exercise weekly Limit alcohol It is important to maintain a healthy blood pressure and healthy cholesterol numbers  Heart disease screening: Screening for heart disease includes screening for blood pressure, fasting lipids, glucose/diabetes screening, BMI height to weight ratio, reviewed of smoking status, physical activity, and diet.    Goals include blood pressure 120/80 or less, maintaining a healthy lipid/cholesterol profile, preventing diabetes or keeping diabetes numbers under good control, not smoking or using tobacco products, exercising most days per week or at least 150 minutes per week of exercise, and eating healthy variety of fruits and vegetables, healthy oils, and avoiding unhealthy food choices like fried food, fast food, high sugar and high cholesterol foods.     Medical care options: I recommend you continue to seek care here first for routine care.  We try really hard to have available appointments Monday through Friday daytime hours for sick visits, acute visits, and physicals.  Urgent care should be used for after hours and weekends for significant issues that cannot wait till the next day.  The emergency department should be used for significant potentially life-threatening emergencies.  The emergency department is expensive, can often have long wait times for less significant concerns, so try to utilize primary care, urgent care, or telemedicine when possible to avoid unnecessary trips to the emergency department.  Virtual visits and telemedicine have been introduced since the pandemic started in 2020, and can be convenient ways to receive medical care.  We offer virtual appointments as well to assist you in a variety of options to seek medical care.   Advanced Directives: I recommend you consider completing a Health Care Power of Attorney and Living  Will.   These documents respect your wishes and help alleviate burdens on your loved ones if you  were to become terminally ill or be in a position to need those documents enforced.    You can complete Advanced Directives yourself, have them notarized, then have copies made for our office, for you and for anybody you feel should have them in safe keeping.  Or, you can have an attorney prepare these documents.   If you haven't updated your Last Will and Testament in a while, it may be worthwhile having an attorney prepare these documents together and save on some costs.      Specific Concerns: I reviewed last neurology note from October 2023, at that time they discussed medications to help dementia including amantadine, or others.  She was referred to brain MRI and formal memory testing.  MRI results reviewed.  I am not sure if she ever went back for formal memory testing.  I recommended she do a follow-up with neurology   Hypertension-continue current medication losartan/HCTZ 100/12.5 mg daily  Diabetes-updated labs today, continue metformin 500 mg daily  Vitamin D deficiency-continue vitamin D supplement 50,000 weekly  Hyperlipidemia-continue Crestor 20 mg daily.   Of note, her husband normally accompanies her but he had an appointment today too in this office.  I reviewed her AV summary with her and asked her to talk with her husband about the recommendations that highlighted.  She declined any vaccines today of note.  Jameyah was seen today for annual exam.  Diagnoses and all orders for this visit:  Routine general medical examination at a health care facility -     Comprehensive metabolic panel -     Hemoglobin A1c -     Lipid panel -     CBC -     POCT Urinalysis DIP (Proadvantage Device) -     VITAMIN D 25 Hydroxy (Vit-D Deficiency, Fractures) -     Microalbumin/Creatinine Ratio, Urine  Vaccine counseling  Encounter for health maintenance examination in adult  Essential  hypertension, benign  Memory change  Postmenopausal estrogen deficiency  Anemia of chronic disease -     CBC  Other specified diabetes mellitus with other specified complication, without long-term current use of insulin (HCC) -     Hemoglobin A1c -     Microalbumin/Creatinine Ratio, Urine  Vitamin D deficiency -     VITAMIN D 25 Hydroxy (Vit-D Deficiency, Fractures)     Follow up pending labs

## 2023-02-02 ENCOUNTER — Other Ambulatory Visit: Payer: Self-pay | Admitting: Medical

## 2023-02-02 DIAGNOSIS — R7989 Other specified abnormal findings of blood chemistry: Secondary | ICD-10-CM

## 2023-02-02 LAB — COMPREHENSIVE METABOLIC PANEL
ALT: 39 IU/L — ABNORMAL HIGH (ref 0–32)
AST: 24 IU/L (ref 0–40)
Albumin: 4.8 g/dL (ref 3.8–4.8)
Alkaline Phosphatase: 124 IU/L — ABNORMAL HIGH (ref 44–121)
BUN/Creatinine Ratio: 19 (ref 12–28)
BUN: 19 mg/dL (ref 8–27)
Bilirubin Total: 0.4 mg/dL (ref 0.0–1.2)
CO2: 24 mmol/L (ref 20–29)
Calcium: 10.1 mg/dL (ref 8.7–10.3)
Chloride: 104 mmol/L (ref 96–106)
Creatinine, Ser: 0.99 mg/dL (ref 0.57–1.00)
Globulin, Total: 3 g/dL (ref 1.5–4.5)
Glucose: 99 mg/dL (ref 70–99)
Potassium: 4.9 mmol/L (ref 3.5–5.2)
Sodium: 144 mmol/L (ref 134–144)
Total Protein: 7.8 g/dL (ref 6.0–8.5)
eGFR: 61 mL/min/{1.73_m2} (ref >59)

## 2023-02-02 LAB — VITAMIN D 25 HYDROXY (VIT D DEFICIENCY, FRACTURES): Vit D, 25-Hydroxy: 34.7 ng/mL (ref 30.0–100.0)

## 2023-02-02 LAB — CBC
Hematocrit: 44.6 % (ref 34.0–46.6)
Hemoglobin: 14.3 g/dL (ref 11.1–15.9)
MCH: 29.2 pg (ref 26.6–33.0)
MCHC: 32.1 g/dL (ref 31.5–35.7)
MCV: 91 fL (ref 79–97)
Platelets: 212 10*3/uL (ref 150–450)
RBC: 4.89 x10E6/uL (ref 3.77–5.28)
RDW: 12 % (ref 11.7–15.4)
WBC: 9.2 10*3/uL (ref 3.4–10.8)

## 2023-02-02 LAB — LIPID PANEL
Chol/HDL Ratio: 6.3 ratio — ABNORMAL HIGH (ref 0.0–4.4)
Cholesterol, Total: 328 mg/dL — ABNORMAL HIGH (ref 100–199)
HDL: 52 mg/dL (ref >39)
LDL Chol Calc (NIH): 245 mg/dL — ABNORMAL HIGH (ref 0–99)
Triglycerides: 161 mg/dL — ABNORMAL HIGH (ref 0–149)
VLDL Cholesterol Cal: 31 mg/dL (ref 5–40)

## 2023-02-02 LAB — MICROALBUMIN / CREATININE URINE RATIO
Creatinine, Urine: 159.5 mg/dL
Microalb/Creat Ratio: 17 mg/g{creat} (ref 0–29)
Microalbumin, Urine: 27.5 ug/mL

## 2023-02-02 LAB — HEMOGLOBIN A1C
Est. average glucose Bld gHb Est-mCnc: 146 mg/dL
Hgb A1c MFr Bld: 6.7 % — ABNORMAL HIGH (ref 4.8–5.6)

## 2023-02-02 NOTE — Progress Notes (Signed)
Lab results are as follows: Cholesterol is way too high putting him at higher risk for heart disease and stroke.  I assume you are not taking your cholesterol medicine to see these numbers this time.  If you are not willing or wanting to take Crestor I can switch you to something else, nevertheless these numbers are quite high.  Electrolytes are okay.  Liver test is elevated as it was in the past.  Given the findings I recommend returning at your convenience for a blood test called alkaline phosphatase isoenzyme as well as an updated ultrasound of your liver.  If agreeable let me know.  Blood count normal.  Vitamin D okay.  Still pending microalbumin kidney marker..  Continue to rest your medicines as usual

## 2023-02-04 ENCOUNTER — Other Ambulatory Visit: Payer: No Typology Code available for payment source

## 2023-02-09 ENCOUNTER — Other Ambulatory Visit: Payer: No Typology Code available for payment source

## 2023-02-11 ENCOUNTER — Ambulatory Visit
Admission: RE | Admit: 2023-02-11 | Discharge: 2023-02-11 | Disposition: A | Discharge status: Home / Self Care | Payer: No Typology Code available for payment source | Source: Ambulatory Visit | Attending: Medical | Admitting: Medical

## 2023-02-11 DIAGNOSIS — R7989 Other specified abnormal findings of blood chemistry: Secondary | ICD-10-CM

## 2023-02-14 NOTE — Progress Notes (Signed)
Hello Dr. Meridee Score Please see the mutual patient's chart record.  I have a question regarding whether she needs additional evaluation by you for the elevated liver test.  She has a slightly elevated liver test that has been up several times in the past but only mildly.  She has in recent years had negative hepatitis B and C testing, normal iron, this recent ultrasound shows no worrisome findings.  I just recently asked her to start back on Crestor because she had not been taking it but that has not been on board recently as a potential cause of elevated liver test.  Does he need referral back to you for further testing such as for autoimmune hepatitis or cholangitis or something else?

## 2023-02-15 ENCOUNTER — Other Ambulatory Visit: Payer: Self-pay | Admitting: *Deleted

## 2023-02-15 DIAGNOSIS — R945 Abnormal results of liver function studies: Secondary | ICD-10-CM

## 2023-02-15 DIAGNOSIS — R7989 Other specified abnormal findings of blood chemistry: Secondary | ICD-10-CM

## 2023-04-25 ENCOUNTER — Telehealth: Payer: Self-pay | Admitting: Medical

## 2023-04-25 MED ORDER — LOSARTAN POTASSIUM-HCTZ 100-12.5 MG PO TABS: 1.0000 | ORAL_TABLET | Freq: Every day | ORAL | 1 refills | Status: DC

## 2023-04-25 NOTE — Telephone Encounter (Signed)
Pt called and is requesting a refill on her hyzaar please send back to the [id number] - [place], [place] - [address]

## 2023-04-25 NOTE — Telephone Encounter (Signed)
done

## 2023-05-18 ENCOUNTER — Ambulatory Visit: Payer: No Typology Code available for payment source | Admitting: Gastroenterology

## 2023-06-07 ENCOUNTER — Ambulatory Visit: Payer: Medicare Other | Admitting: Medical

## 2023-08-02 ENCOUNTER — Ambulatory Visit: Payer: Medicare Other | Admitting: Medical

## 2023-08-09 ENCOUNTER — Ambulatory Visit (INDEPENDENT_AMBULATORY_CARE_PROVIDER_SITE_OTHER): Payer: Medicare Other | Admitting: Medical

## 2023-08-09 VITALS — BP 130/84 | HR 88 | Wt 126.8 lb

## 2023-08-09 DIAGNOSIS — Z1231 Encounter for screening mammogram for malignant neoplasm of breast: Secondary | ICD-10-CM

## 2023-08-09 DIAGNOSIS — E559 Vitamin D deficiency, unspecified: Secondary | ICD-10-CM | POA: Diagnosis not present

## 2023-08-09 DIAGNOSIS — R059 Cough, unspecified: Secondary | ICD-10-CM

## 2023-08-09 DIAGNOSIS — E782 Mixed hyperlipidemia: Secondary | ICD-10-CM | POA: Diagnosis not present

## 2023-08-09 DIAGNOSIS — I10 Essential (primary) hypertension: Secondary | ICD-10-CM | POA: Diagnosis not present

## 2023-08-09 DIAGNOSIS — E1369 Other specified diabetes mellitus with other specified complication: Secondary | ICD-10-CM | POA: Diagnosis not present

## 2023-08-09 DIAGNOSIS — Z780 Asymptomatic menopausal state: Secondary | ICD-10-CM

## 2023-08-09 LAB — CBC WITH DIFFERENTIAL/PLATELET
Basophils Absolute: 0 10*3/uL (ref 0.0–0.2)
Basos: 0 %
EOS (ABSOLUTE): 0.1 10*3/uL (ref 0.0–0.4)
Eos: 2 %
Hematocrit: 43.7 % (ref 34.0–46.6)
Hemoglobin: 14.1 g/dL (ref 11.1–15.9)
Immature Grans (Abs): 0 10*3/uL (ref 0.0–0.1)
Immature Granulocytes: 0 %
Lymphocytes Absolute: 1.3 10*3/uL (ref 0.7–3.1)
Lymphs: 21 %
MCH: 29.4 pg (ref 26.6–33.0)
MCHC: 32.3 g/dL (ref 31.5–35.7)
MCV: 91 fL (ref 79–97)
Monocytes Absolute: 0.2 10*3/uL (ref 0.1–0.9)
Monocytes: 4 %
Neutrophils Absolute: 4.5 10*3/uL (ref 1.4–7.0)
Neutrophils: 73 %
Platelets: 106 10*3/uL — ABNORMAL LOW (ref 150–450)
RBC: 4.79 x10E6/uL (ref 3.77–5.28)
RDW: 12.1 % (ref 11.7–15.4)
WBC: 6.2 10*3/uL (ref 3.4–10.8)

## 2023-08-09 NOTE — Patient Instructions (Addendum)
 Please call to schedule your mammogram and bone density test .   [facility]  [phone] [address], [address] [place], [place] [address]    You are due for some vaccines.  These would need to be done at pharmacy  Shingles vaccine Flu shot yearly Tetanus booster Tdap RSV respiratory syncytial virus vaccine

## 2023-08-09 NOTE — Progress Notes (Signed)
 Subjective:  Alison Zimmerman is a 73 y.o. female who presents for Chief Complaint  Patient presents with   Medical Management of Chronic Issues    Med check- has had a cough for over a week that is not helping with OTC medication. Needs updated mammo/bone density. Has not had eye exam in the last year     Here for med check  She is alone in the room today.  Her husband is out in the waiting room.  She notes compliance with her medications  She has had a cold in the last week although its mostly resolved.  She is using some over-the-counter bronchial smooth medicine and her husband picked that up from the drugstore.  Had symptoms for about a week, no shortness of breath or wheezing.  No fever.  She gets some arthritis aches and pains every now and then.  Diabetes-compliant with metformin, but not checking sugars  Hypertension-compliant with medication  Hyperlipidemia-compliant with medication  No other aggravating or relieving factors.    No other c/o.  Past Medical History:  Diagnosis Date   Allergy    honeydew melon   Anemia    Ductal carcinoma in situ of breast dx 08/17/13--  oncologist-  dr Welton Flakes   Stage 0 (Tis, Nx)  High Grade DCIS right breast,  ER/ PR + --  per pt on 06-10-2014  states juicing w/ fresh fruit and vegatable's for natural treatment at this time   History of sepsis    admitted 05-16-2015  w/ urosepsis/  acute renal failure due to pyelonephritis / stone obstruction   Hypertension    Kidney stone    Migraine    Right ureteral stone    Wears glasses    Current Outpatient Medications on File Prior to Visit  Medication Sig Dispense Refill   CALCIUM PO Take by mouth.     diphenhydrAMINE-Acetaminophen (PERCOGESIC PO) Take by mouth.     gabapentin (NEURONTIN) 100 MG capsule Take 1 capsule (100 mg total) by mouth 2 (two) times daily. 180 capsule 1   losartan-hydrochlorothiazide (HYZAAR) 100-12.5 MG tablet Take 1 tablet by mouth daily. 90 tablet 1   metFORMIN  (GLUCOPHAGE) 500 MG tablet Take 1 tablet (500 mg total) by mouth daily with breakfast. 90 tablet 1   NONFORMULARY OR COMPOUNDED ITEM      NONFORMULARY OR COMPOUNDED ITEM Take 1 tablet by mouth daily.     rosuvastatin (CRESTOR) 20 MG tablet Take 1 tablet (20 mg total) by mouth daily. 90 tablet 3   Vitamin D, Ergocalciferol, (DRISDOL) 1.25 MG (50000 UNIT) CAPS capsule Take 1 capsule (50,000 Units total) by mouth every 7 (seven) days. 12 capsule 3   No current facility-administered medications on file prior to visit.     The following portions of the patient's history were reviewed and updated as appropriate: allergies, current medications, past family history, past medical history, past social history, past surgical history and problem list.  ROS Otherwise as in subjective above  Objective: BP 130/84   Pulse 88   Wt 126 lb 12.8 oz (57.5 kg)   LMP  (LMP Unknown)   BMI 22.46 kg/m   General appearance: alert, no distress, well developed, well nourished HEENT: normocephalic, sclerae anicteric, conjunctiva pink and moist, TMs pearly, nares with some mild turbinate edema and clear discharge, mild erythema, pharynx normal Oral cavity: MMM, no lesions Neck: supple, no lymphadenopathy, no thyromegaly, no masses Heart: RRR, normal S1, S2, no murmurs Lungs: CTA bilaterally, no wheezes,  rhonchi, or rales Abdomen: +bs, soft, non tender, non distended, no masses, no hepatomegaly, no splenomegaly Pulses: 2+ radial pulses, 2+ pedal pulses, normal cap refill Ext: no edema   Assessment: Encounter Diagnoses  Name Primary?   Essential hypertension, benign Yes   Other specified diabetes mellitus with other specified complication, without long-term current use of insulin (HCC)    Vitamin D deficiency    Mixed dyslipidemia    Cough, unspecified type    Encounter for screening mammogram for malignant neoplasm of breast    Postmenopausal estrogen deficiency      Plan: Hypertension-continue  losartan HCT 100/12.5 mg daily  Hyperlipidemia-continue rosuvastatin Crestor 20 mg daily  Diabetes-continue metformin 500 mg daily, continue eating healthy low sugar diet  Vitamin D deficiency-continue supplement  Cough/URI-mostly resolved.  She has used some over-the-counter remedy which she feels is working well  Screening for [employer] she call to schedule mammogram  Postmenopausal history deficiency-advise she call and schedule bone density test  We have talked about mammogram and bone density in prior years.  For whatever reason they have not got around to doing this although she is aware that she is behind on these recommendations   Gabryelle was seen today for medical management of chronic issues.  Diagnoses and all orders for this visit:  Essential hypertension, benign  Other specified diabetes mellitus with other specified complication, without long-term current use of insulin (HCC) -     Hemoglobin A1c -     Comprehensive metabolic panel  Vitamin D deficiency  Mixed dyslipidemia -     Lipid panel -     Comprehensive metabolic panel  Cough, unspecified type -     CBC with Differential/Platelet  Encounter for screening mammogram for malignant neoplasm of breast -     MM 3D SCREENING MAMMOGRAM BILATERAL BREAST; Future  Postmenopausal estrogen deficiency -     DG Bone Density; Future    Follow up: pending labs, studies

## 2023-08-10 LAB — COMPREHENSIVE METABOLIC PANEL
ALT: 43 IU/L — ABNORMAL HIGH (ref 0–32)
AST: 47 IU/L — ABNORMAL HIGH (ref 0–40)
Albumin: 4.4 g/dL (ref 3.8–4.8)
Alkaline Phosphatase: 175 IU/L — ABNORMAL HIGH (ref 44–121)
BUN/Creatinine Ratio: 15 (ref 12–28)
BUN: 13 mg/dL (ref 8–27)
Bilirubin Total: 0.3 mg/dL (ref 0.0–1.2)
CO2: 21 mmol/L (ref 20–29)
Calcium: 9.4 mg/dL (ref 8.7–10.3)
Chloride: 105 mmol/L (ref 96–106)
Creatinine, Ser: 0.87 mg/dL (ref 0.57–1.00)
Globulin, Total: 3.2 g/dL (ref 1.5–4.5)
Glucose: 91 mg/dL (ref 70–99)
Potassium: 4.1 mmol/L (ref 3.5–5.2)
Sodium: 146 mmol/L — ABNORMAL HIGH (ref 134–144)
Total Protein: 7.6 g/dL (ref 6.0–8.5)
eGFR: 70 mL/min/{1.73_m2} (ref >59)

## 2023-08-10 LAB — LIPID PANEL
Chol/HDL Ratio: 5.7 ratio — ABNORMAL HIGH (ref 0.0–4.4)
Cholesterol, Total: 273 mg/dL — ABNORMAL HIGH (ref 100–199)
HDL: 48 mg/dL (ref >39)
LDL Chol Calc (NIH): 198 mg/dL — ABNORMAL HIGH (ref 0–99)
Triglycerides: 148 mg/dL (ref 0–149)
VLDL Cholesterol Cal: 27 mg/dL (ref 5–40)

## 2023-08-10 LAB — HEMOGLOBIN A1C
Est. average glucose Bld gHb Est-mCnc: 137 mg/dL
Hgb A1c MFr Bld: 6.4 % — ABNORMAL HIGH (ref 4.8–5.6)

## 2023-08-12 NOTE — Progress Notes (Signed)
 Please call patient and her husband.  She has memory issues so need to talk to her husband as well  Labs show cholesterol numbers are too high.  I assume she is not taking her cholesterol medicine.  So find out if they are not taking it, and are they planning to take it or not.  She should be on medicine to lower her risk of heart disease and stroke.  If they are not going to use any type of cholesterol-lowering medicine then there is no reason for [place] to check the blood work going forward  Diabetes marker at risk for diabetes.  I recommend continue to careful low sugar diet to avoid lots of sweets and junk food and sugary drinks such as soda and sweet tea and juice.  Platelets were mildly low.  She has not really had platelets low in the past so we can monitor this  Liver test are little bit elevated for some reason.  She has had prior testing that was negative for hepatitis B and see back in 2023.  Her iron has been normal in the past as well ultrasound did not show anything significant last year in 2024.  Rest of labs okay  I recommend a referral to liver doctor for further evaluation regarding the liver test and alkaline phosphatase.  See if agreeable

## 2023-08-13 ENCOUNTER — Other Ambulatory Visit: Payer: Self-pay | Admitting: Medical

## 2023-08-13 MED ORDER — ROSUVASTATIN CALCIUM 20 MG PO TABS
20.0000 mg | ORAL_TABLET | Freq: Every day | ORAL | 2 refills | Status: AC
Start: 2023-08-13 — End: 2024-08-12

## 2023-08-13 MED ORDER — GABAPENTIN 100 MG PO CAPS: 100.0000 mg | ORAL_CAPSULE | Freq: Two times a day (BID) | ORAL | 2 refills | Status: DC

## 2023-08-13 MED ORDER — METFORMIN HCL 500 MG PO TABS: 500.0000 mg | ORAL_TABLET | Freq: Every day | ORAL | 2 refills | Status: AC

## 2023-08-13 MED ORDER — LOSARTAN POTASSIUM-HCTZ 100-12.5 MG PO TABS: 1.0000 | ORAL_TABLET | Freq: Every day | ORAL | 2 refills | Status: AC

## 2023-08-13 MED ORDER — VITAMIN D (ERGOCALCIFEROL) 1.25 MG (50000 UNIT) PO CAPS: 50000.0000 [IU] | ORAL_CAPSULE | ORAL | 2 refills | Status: AC

## 2023-08-19 ENCOUNTER — Other Ambulatory Visit: Payer: Self-pay | Admitting: Medical

## 2023-08-19 ENCOUNTER — Other Ambulatory Visit: Payer: Self-pay | Admitting: Internal Medicine

## 2023-08-19 MED ORDER — PREDNISONE 10 MG PO TABS: ORAL_TABLET | ORAL | 0 refills | Status: DC

## 2023-08-19 NOTE — Telephone Encounter (Signed)
 Patient husband states  that she only has a dry cough and some SOB. He is wanting prednisione sent in for her. I advised him that she was not having any SOB at visit and noted the cough had mostly resolved. I had advised him that we may need to see her, but since we had no open appointments left for the day, I would ask for the next steps. He states he doesn't feel she needs an appt, and just to be given medication.  He states that Gabapentin is the medication that is causing dizziness, shakiness, crying spells and feeling of worthiness. He doesn't know why gabapentin was prescribed. I advised him that they would not warrant prednisone to help with this either like he wants it for.   Please advise.

## 2023-08-19 NOTE — Telephone Encounter (Signed)
 Pts spouse calling back in to see what medication the dr is going to suggest. I advised he needs to give dr time to go over the notes and see what can be done.

## 2023-08-19 NOTE — Telephone Encounter (Signed)
 Pt's spouse is calling back about medication and wants to know what your going to send in. We got a refill on gabapentin but I have declined this since she is having side effects from it.

## 2023-08-19 NOTE — Telephone Encounter (Signed)
 Copied from CRM [id number]. Topic: General - Other >> Aug 19, 2023  8:34 AM Truddie Crumble wrote: Reason for CRM: patient husband called staling the patient has possible bronchitis and he want the patient to be prescribed prednisone for a week. The last medication that was prescribed to the patient gave her symptoms of shakiness, dizziness, crying spells, feeling of worthiness    CB [phone]

## 2023-08-19 NOTE — Telephone Encounter (Signed)
 Pt was notified.

## 2024-01-02 ENCOUNTER — Telehealth: Admitting: Family Medicine

## 2024-01-02 ENCOUNTER — Other Ambulatory Visit: Payer: Self-pay | Admitting: Medical

## 2024-01-02 ENCOUNTER — Ambulatory Visit: Payer: Self-pay

## 2024-01-02 MED ORDER — PAXLOVID (300/100) 20 X 150 MG & 10 X 100MG PO TBPK: 3.0000 | ORAL_TABLET | Freq: Two times a day (BID) | ORAL | 0 refills | Status: AC

## 2024-01-02 NOTE — Telephone Encounter (Signed)
 FYI Only or Action Required?: FYI only for provider.  Patient was last seen in primary care on 08/09/2023 by Bulah Alm RAMAN, PA-C.  Called Nurse Triage reporting Covid Positive.  Symptoms began several weeks ago.  Interventions attempted: OTC medications: cough medication.  Symptoms are: unchanged.  Triage Disposition: Call PCP Within 24 Hours  Patient/caregiver understands and will follow disposition?: Yes        Copied from CRM #[id number]. Topic: Clinical - Red Word Triage >> Jan 02, 2024 10:49 AM Drema MATSU wrote: Red Word that prompted transfer to Nurse Triage: Patient and her husband both has COVID and is experiencing symptoms. She has a cough, very fatigue, and dizziness.   *No fever Reason for Disposition  [1] HIGH RISK patient (e.g., weak immune system, age > 64 years, obesity with BMI 30 or higher, pregnant, chronic lung disease or other chronic medical condition) AND [2] COVID symptoms (e.g., cough, fever)  (Exceptions: Already seen by PCP and no new or worsening symptoms.)  Answer Assessment - Initial Assessment Questions 1. COVID-19 DIAGNOSIS: How do you know that you have COVID? (e.g., positive lab test or self-test, diagnosed by doctor or NP/PA, symptoms after exposure).     Home test yesterday 2. COVID-19 EXPOSURE: Was there any known exposure to COVID before the symptoms began? CDC Definition of close contact: within 6 feet (2 meters) for a total of 15 minutes or more over a 24-hour period.      no 3. ONSET: When did the COVID-19 symptoms start?      4-5 weeks ago  4. WORST SYMPTOM: What is your worst symptom? (e.g., cough, fever, shortness of breath, muscle aches)     cough 5. COUGH: Do you have a cough? If Yes, ask: How bad is the cough?       mod 6. FEVER: Do you have a fever? If Yes, ask: What is your temperature, how was it measured, and when did it start?     No fever 7. RESPIRATORY STATUS: Describe your breathing? (e.g., normal;  shortness of breath, wheezing, unable to speak)      Occasional sob with exertion 8. BETTER-SAME-WORSE: Are you getting better, staying the same or getting worse compared to yesterday?  If getting worse, ask, In what way?     same 9. OTHER SYMPTOMS: Do you have any other symptoms?  (e.g., chills, fatigue, headache, loss of smell or taste, muscle pain, sore throat)     Headache, sob,  10. HIGH RISK DISEASE: Do you have any chronic medical problems? (e.g., asthma, heart or lung disease, weak immune system, obesity, etc.)       no 11. VACCINE: Have you had the COVID-19 vaccine? If Yes, ask: Which one, how many shots, when did you get it?       yes  13. O2 SATURATION MONITOR:  Do you use an oxygen saturation monitor (pulse oximeter) at home? If Yes, ask What is your reading (oxygen level) today? What is your usual oxygen saturation reading? (e.g., 95%)       no  Protocols used: Coronavirus (COVID-19) Diagnosed or Suspected-A-AH

## 2024-03-30 ENCOUNTER — Telehealth (INDEPENDENT_AMBULATORY_CARE_PROVIDER_SITE_OTHER): Admitting: Medical

## 2024-03-30 VITALS — Wt 140.0 lb

## 2024-03-30 DIAGNOSIS — R053 Chronic cough: Secondary | ICD-10-CM

## 2024-03-30 DIAGNOSIS — R519 Headache, unspecified: Secondary | ICD-10-CM

## 2024-03-30 DIAGNOSIS — R0989 Other specified symptoms and signs involving the circulatory and respiratory systems: Secondary | ICD-10-CM | POA: Diagnosis not present

## 2024-03-30 DIAGNOSIS — R062 Wheezing: Secondary | ICD-10-CM | POA: Diagnosis not present

## 2024-03-30 DIAGNOSIS — G8929 Other chronic pain: Secondary | ICD-10-CM

## 2024-03-30 MED ORDER — ALBUTEROL SULFATE HFA 108 (90 BASE) MCG/ACT IN AERS: 2.0000 | INHALATION_SPRAY | Freq: Four times a day (QID) | RESPIRATORY_TRACT | 0 refills | Status: DC | PRN

## 2024-03-30 NOTE — Progress Notes (Signed)
 Subjective:     Patient ID: Alison Zimmerman, female   DOB: 20-May-1951, 73 y.o.   MRN: [id number]  This visit type was conducted due to national recommendations for restrictions regarding the COVID-19 Pandemic (e.g. social distancing) in an effort to limit this patient's exposure and mitigate transmission in our community.  Due to their co-morbid illnesses, this patient is at least at moderate risk for complications without adequate follow up.  This format is felt to be most appropriate for this patient at this time.    Documentation for virtual audio and video telecommunications through [place] encounter:  The patient was located at home. The provider was located in the office. The patient did consent to this visit and is aware of possible charges through their insurance for this visit.  The other persons participating in this telemedicine service were none. Time spent on call was 20 minutes and in review of previous records 20 minutes total.  This virtual service is not related to other E/M service within previous 7 days.   HPI Chief Complaint  Patient presents with   Acute Visit    Allergy symptoms of Dust mites, and would like referral to allergist Cough,sneezing,chest tightness, cough, wheezing, HA, fatigue been going on months   Virtual consult for dust mite concern.   Mostly husband providing history.  They both being seen today virtually for the same issue.  She has symptoms have been persistent for several months including disrupted sleep, itchy skin, coughing, chest tightness, wheezing, headaches, fatigue, post nasal drip, runny nose at times, redness of eyes.    Worried more about mattress as symptoms can sometimes be worse at night or in the bed.  After doing some research about symptoms and causes he feels pretty confident he and his wife had dust mite exposure.  Has been plans to get a new mattress soon.  He also is purchasing an product manager for the home.  He notes  a prior issue with water damage from a busted water heater that resulted in them taking out carpet and putting in non allergenic flooring.  They have no animals in the home.  They report reasonable and adequate cleanliness and routine house cleaning and linens washing.  She does not want to use an allergy medication.  she wants to have evaluation and testing for dust mites and other causes.  she would like to see an immunologist.  No other aggravating or relieving factors. No other complaint.   Past Medical History:  Diagnosis Date   Allergy    honeydew melon   Anemia    Ductal carcinoma in situ of breast dx 08/17/13--  oncologist-  dr fernand   Stage 0 (Tis, Nx)  High Grade DCIS right breast,  ER/ PR + --  per pt on 06-10-2014  states juicing w/ fresh fruit and vegatable's for natural treatment at this time   History of sepsis    admitted 05-16-2015  w/ urosepsis/  acute renal failure due to pyelonephritis / stone obstruction   Hypertension    Kidney stone    Migraine    Right ureteral stone    Wears glasses     Current Outpatient Medications on File Prior to Visit  Medication Sig Dispense Refill   CALCIUM  PO Take by mouth.     diphenhydrAMINE-Acetaminophen  (PERCOGESIC PO) Take by mouth.     losartan -hydrochlorothiazide (HYZAAR) 100-12.5 MG tablet Take 1 tablet by mouth daily. 90 tablet 2   metFORMIN  (GLUCOPHAGE ) 500 MG tablet Take  1 tablet (500 mg total) by mouth daily with breakfast. 90 tablet 2   NONFORMULARY OR COMPOUNDED ITEM      NONFORMULARY OR COMPOUNDED ITEM Take 1 tablet by mouth daily.     rosuvastatin  (CRESTOR ) 20 MG tablet Take 1 tablet (20 mg total) by mouth daily. 90 tablet 2   Vitamin D , Ergocalciferol , (DRISDOL ) 1.25 MG (50000 UNIT) CAPS capsule Take 1 capsule (50,000 Units total) by mouth every 7 (seven) days. 12 capsule 2   No current facility-administered medications on file prior to visit.    Review of Systems As in subjective    Objective:   Physical  Exam Due to coronavirus pandemic stay at home measures, patient visit was virtual and they were not examined in person.   Wt 140 lb (63.5 kg) Comment: per Husband  LMP  (LMP Unknown)   BMI 24.80 kg/m   Gen: wd, wn, nad      Assessment:     Encounter Diagnoses  Name Primary?   Chronic cough Yes   Chronic nonintractable headache, unspecified headache type    Runny nose    Wheezing        Plan:     We discussed possible causes of these variety of symptoms which suggest allergies or other trigger.  This is also a virtual visit.  We discussed possibly doing an in person evaluation including exam, possible chest x-ray.  We discussed trial of over-the-counter allergy medicine and trial of albuterol  for wheezing symptoms.  They declined and really just want a referral to immunology at this point for evaluation.   Maripat was seen today for acute visit.  Diagnoses and all orders for this visit:  Chronic cough -     Ambulatory referral to Immunology  Chronic nonintractable headache, unspecified headache type -     Ambulatory referral to Immunology  Runny nose -     Ambulatory referral to Immunology  Wheezing -     Ambulatory referral to Immunology  Other orders -     albuterol  (VENTOLIN  HFA) 108 (90 Base) MCG/ACT inhaler; Inhale 2 puffs into the lungs every 6 (six) hours as needed for wheezing or shortness of breath.    F/u pending referral

## 2024-05-17 ENCOUNTER — Ambulatory Visit (INDEPENDENT_AMBULATORY_CARE_PROVIDER_SITE_OTHER): Admitting: Allergy

## 2024-05-17 ENCOUNTER — Other Ambulatory Visit: Payer: Self-pay

## 2024-05-17 ENCOUNTER — Encounter: Payer: Self-pay | Admitting: Allergy

## 2024-05-17 VITALS — BP 144/68 | HR 114

## 2024-05-17 DIAGNOSIS — R0602 Shortness of breath: Secondary | ICD-10-CM | POA: Diagnosis not present

## 2024-05-17 DIAGNOSIS — R058 Other specified cough: Secondary | ICD-10-CM | POA: Diagnosis not present

## 2024-05-17 DIAGNOSIS — J3089 Other allergic rhinitis: Secondary | ICD-10-CM | POA: Diagnosis not present

## 2024-05-17 MED ORDER — ALBUTEROL SULFATE HFA 108 (90 BASE) MCG/ACT IN AERS: 2.0000 | INHALATION_SPRAY | RESPIRATORY_TRACT | 1 refills | Status: AC | PRN

## 2024-05-17 NOTE — Progress Notes (Signed)
 New Patient Note  RE: Alison Zimmerman MRN: [id number] DOB: Apr 05, 1951 Date of Office Visit: 05/17/2024  Primary care provider: Bulah Alm RAMAN, PA-C  Chief Complaint: Allergies  History of present illness: Alison Zimmerman is a 73 y.o. female presenting today for evaluation of allergies.  She presents today with her husband. Discussed the use of AI scribe software for clinical note transcription with the patient, who gave verbal consent to proceed.  She has experienced shortness of breath and cough that they believe is most likely due to allergies specifically dust mites.  She has not used albuterol  inhaler for the symptoms.  Additionally, she uses organic remedies such as a 'lung tonic' and 'brachial suit' for respiratory symptoms, which she finds effective.  She suspects a dust mite allergy, experiencing symptoms such as sneezing, in addition to the respiratory symptoms.  She has taken measures to reduce dust mite exposure, including changing sheets weekly and using dust mite covers for pillows and mattresses, which have helped alleviate symptoms.  She also does not use humidifiers in the home.  She has not undergone formal allergy testing.  She has also started to remove carpeting in the home with further plans to remove all the carpeting eventually.  At this time not taking any antihistamines or other allergy medications.   No history of food allergy, eczema.   Review of systems: 10pt ROS negative unless noted above in HPI  Past medical history: Past Medical History:  Diagnosis Date   Allergy    honeydew melon   Anemia    Ductal carcinoma in situ of breast dx 08/17/13--  oncologist-  dr fernand   Stage 0 ([place], [place])  High Grade DCIS right breast,  ER/ PR + --  per pt on 06-10-2014  states juicing w/ fresh fruit and vegatable's for natural treatment at this time   History of sepsis    admitted 05-16-2015  w/ urosepsis/  acute renal failure due to pyelonephritis / stone  obstruction   Hypertension    Kidney stone    Migraine    Right ureteral stone    Wears glasses     Past surgical history: Past Surgical History:  Procedure Laterality Date   BUNIONECTOMY Left 05/31/1998   COLONOSCOPY  11/17/2021   CYSTOSCOPY WITH RETROGRADE PYELOGRAM, URETEROSCOPY AND STENT PLACEMENT Right 06/16/2015   Procedure: CYSTOSCOPY WITH RIGHT RETROGRADE PYELOGRAM, URETEROSCOPY AND STENT EXCHANGE;  Surgeon: Redell Lynwood Napoleon, MD;  Location: [facility];  Service: Urology;  Laterality: Right;   CYSTOSCOPY WITH STENT PLACEMENT Right 05/17/2015   Procedure: CYSTOSCOPY, RETROGRADE PYELOGRAM WITH STENT PLACEMENT;  Surgeon: Redell Lynwood Napoleon, MD;  Location: [facility];  Service: Urology;  Laterality: Right;   HOLMIUM LASER APPLICATION Right 06/16/2015   Procedure: HOLMIUM LASER APPLICATION;  Surgeon: Redell Lynwood Napoleon, MD;  Location: [facility];  Service: Urology;  Laterality: Right;   ORIF FEMUR FRACTURE Left age 47   STONE EXTRACTION WITH BASKET Right 06/16/2015   Procedure: STONE EXTRACTION WITH BASKET;  Surgeon: Redell Lynwood Napoleon, MD;  [facility]: [facility];  Service: Urology;  Laterality: Right;   TONSILLECTOMY     TUBAL LIGATION  yrs ago    Family history:  Family History  Problem Relation Age of Onset   Stroke Mother    Dementia Mother    Stroke Father    Cancer Neg Hx    Diabetes Neg Hx    Heart disease Neg Hx    Hypertension  Neg Hx    Colon cancer Neg Hx    Esophageal cancer Neg Hx    Inflammatory bowel disease Neg Hx    Liver disease Neg Hx    Pancreatic cancer Neg Hx    Rectal cancer Neg Hx    Stomach cancer Neg Hx     Social history: Lives in a home with some carpeting with electric heating and central cooling.  No reported animals.  No concern for water damage or mildew or roaches in the home.  However does report there is some areas on the crawlspace that may accumulate moisture that he plans to remedy.  No  reports of smoking history.   Medication List: Current Outpatient Medications  Medication Sig Dispense Refill   CALCIUM  PO Take by mouth.     diphenhydrAMINE-Acetaminophen  (PERCOGESIC PO) Take by mouth. (Patient taking differently: Take by mouth as needed.)     losartan -hydrochlorothiazide (HYZAAR) 100-12.5 MG tablet Take 1 tablet by mouth daily. 90 tablet 2   metFORMIN  (GLUCOPHAGE ) 500 MG tablet Take 1 tablet (500 mg total) by mouth daily with breakfast. 90 tablet 2   NONFORMULARY OR COMPOUNDED ITEM      NONFORMULARY OR COMPOUNDED ITEM Take 1 tablet by mouth daily.     rosuvastatin  (CRESTOR ) 20 MG tablet Take 1 tablet (20 mg total) by mouth daily. 90 tablet 2   Vitamin D , Ergocalciferol , (DRISDOL ) 1.25 MG (50000 UNIT) CAPS capsule Take 1 capsule (50,000 Units total) by mouth every 7 (seven) days. 12 capsule 2   albuterol  (VENTOLIN  HFA) 108 (90 Base) MCG/ACT inhaler Inhale 2 puffs into the lungs every 6 (six) hours as needed for wheezing or shortness of breath. (Patient not taking: Reported on 05/17/2024) 8 g 0   No current facility-administered medications for this visit.    Known medication allergies: Allergies[1]   Physical examination: Blood pressure (!) 144/68, pulse (!) 114, SpO2 99%.  General: Alert, interactive, in no acute distress. HEENT: PERRLA, TMs pearly gray, turbinates non-edematous without discharge, post-pharynx non erythematous. Neck: Supple without lymphadenopathy. Lungs: Clear to auscultation without wheezing, rhonchi or rales. {no increased work of breathing. CV: Normal S1, S2 without murmurs. Abdomen: Nondistended, nontender. Skin: Warm and dry, without lesions or rashes. Extremities:  No clubbing, cyanosis or edema. Neuro:   Grossly intact.  Diagnostics/Labs: Labs:  Component     Latest Ref Rng 08/09/2023  WBC     3.4 - 10.8 x10E3/uL 6.2   RBC     3.77 - 5.28 x10E6/uL 4.79   Hemoglobin     11.1 - 15.9 g/dL 85.8   HCT     65.9 - 53.3 % 43.7   MCV      79 - 97 fL 91   MCH     26.6 - 33.0 pg 29.4   MCHC     31.5 - 35.7 g/dL 67.6   RDW     88.2 - 84.5 % 12.1   Platelets     150 - 450 x10E3/uL 106 (L)   Neutrophils     Not Estab. % 73   Lymphs     Not Estab. % 21   Monocytes     Not Estab. % 4   Eos     Not Estab. % 2   Basos     Not Estab. % 0   NEUT#     1.4 - 7.0 x10E3/uL 4.5   Lymphs Abs     0.7 - 3.1 x10E3/uL 1.3   Monocytes Absolute  0.1 - 0.9 x10E3/uL 0.2   EOS (ABSOLUTE)     0.0 - 0.4 x10E3/uL 0.1   Basophils Absolute     0.0 - 0.2 x10E3/uL 0.0   Immature Granulocytes     Not Estab. % 0   Immature Grans (Abs)     0.0 - 0.1 x10E3/uL 0.0   Glucose     70 - 99 mg/dL 91   BUN     8 - 27 mg/dL 13   Creatinine     9.42 - 1.00 mg/dL 9.12   eGFR     >40 fO/fpw/8.26 70   BUN/Creatinine Ratio     12 - 28  15   Sodium     134 - 144 mmol/L 146 (H)   Potassium     3.5 - 5.2 mmol/L 4.1   Chloride     96 - 106 mmol/L 105   CO2     20 - 29 mmol/L 21   Calcium      8.7 - 10.3 mg/dL 9.4   Total Protein     6.0 - 8.5 g/dL 7.6   Albumin     3.8 - 4.8 g/dL 4.4   Globulin, Total     1.5 - 4.5 g/dL 3.2   Total Bilirubin     0.0 - 1.2 mg/dL 0.3   Alkaline Phosphatase     44 - 121 IU/L 175 (H)   AST     0 - 40 IU/L 47 (H)   ALT     0 - 32 IU/L 43 (H)    Spirometry: FEV1: 1.69L 99%, FVC: 2.32L 105%, ratio consistent with nonobstructive pattern  Assessment and plan: Shortness of breath, cough - Likely allergic component - have access to albuterol  inhaler 2 puffs every 4-6 hours as needed for cough/wheeze/shortness of breath/chest tightness.  May use 15-20 minutes prior to activity.   Monitor frequency of use.    Asthma control goals:  Full participation in all desired activities (may need albuterol  before activity) Albuterol  use two time or less a week on average (not counting use with activity) Cough interfering with sleep two time or less a month Oral steroids no more than once a year No  hospitalizations  Allergic rhinitis - Likely with dust mite allergy - Will plan to skin test to environmental allergens.  Hold antihistamines for 3 days prior to testing.  Will provide avoidance measures for positive allergens. - If needed for symptom control can take antihistamine like Allegra 180 mg daily as needed  Schedule allergy skin testing hold antihistamines for 3 days prior (Env 1-55)  I appreciate the opportunity to take part in Prairie's care. Please do not hesitate to contact me with questions.  Sincerely,   Danita Brain, MD Allergy/Immunology [facility]     [1]  Allergies Allergen Reactions   Other     No blood products. Pt is a fish farm manager witness. Honeydew--  hives

## 2024-05-17 NOTE — Patient Instructions (Signed)
 Shortness of breath, cough - Likely allergic component - have access to albuterol  inhaler 2 puffs every 4-6 hours as needed for cough/wheeze/shortness of breath/chest tightness.  May use 15-20 minutes prior to activity.   Monitor frequency of use.    Asthma control goals:  Full participation in all desired activities (may need albuterol  before activity) Albuterol  use two time or less a week on average (not counting use with activity) Cough interfering with sleep two time or less a month Oral steroids no more than once a year No hospitalizations  Allergic rhinitis - Likely with dust mite allergy - Will plan to skin test to environmental allergens.  Hold antihistamines for 3 days prior to testing.  Will provide avoidance measures for positive allergens. - If needed for symptom control can take antihistamine like Allegra 180 mg daily as needed  Schedule allergy skin testing hold antihistamines for 3 days prior

## 2024-06-14 ENCOUNTER — Encounter: Payer: Self-pay | Admitting: Allergy

## 2024-06-14 ENCOUNTER — Ambulatory Visit: Admitting: Allergy

## 2024-06-14 DIAGNOSIS — J3089 Other allergic rhinitis: Secondary | ICD-10-CM | POA: Diagnosis not present

## 2024-06-14 MED ORDER — FEXOFENADINE HCL 180 MG PO TABS: 180.0000 mg | ORAL_TABLET | Freq: Every day | ORAL | 5 refills | Status: AC | PRN

## 2024-06-14 NOTE — Patient Instructions (Addendum)
 Shortness of breath, cough - with allergic component - have access to albuterol  inhaler 2 puffs every 4-6 hours as needed for cough/wheeze/shortness of breath/chest tightness.  May use 15-20 minutes prior to activity.   Monitor frequency of use.    Asthma control goals:  Full participation in all desired activities (may need albuterol  before activity) Albuterol  use two time or less a week on average (not counting use with activity) Cough interfering with sleep two time or less a month Oral steroids no more than once a year No hospitalizations  Allergic rhinitis - Testing today showed: trees, outdoor molds, and dust mites - Copy of test results provided.  - Avoidance measures provided. - If needed for symptom control can take antihistamine Allegra  180 mg daily as needed  Follow-up in 4-6 months   Avoidance measures: Reducing Pollen Exposure  The American Academy of Allergy , Asthma and Immunology suggests the following steps to reduce your exposure to pollen during allergy  seasons.    Do not hang sheets or clothing out to dry; pollen may collect on these items. Do not mow lawns or spend time around freshly cut grass; mowing stirs up pollen. Keep windows closed at night.  Keep car windows closed while driving. Minimize morning activities outdoors, a time when pollen counts are usually at their highest. Stay indoors as much as possible when pollen counts or humidity is high and on windy days when pollen tends to remain in the air longer. Use air conditioning when possible.  Many air conditioners have filters that trap the pollen spores. Use a HEPA room air filter to remove pollen form the indoor air you breathe.   Control of Mold Allergen   Mold and fungi can grow on a variety of surfaces provided certain temperature and moisture conditions exist.  Outdoor molds grow on plants, decaying vegetation and soil.  The major outdoor mold, Alternaria and Cladosporium, are found in very high  numbers during hot and dry conditions.  Generally, a late Summer - Fall peak is seen for common outdoor fungal spores.  Rain will temporarily lower outdoor mold spore count, but counts rise rapidly when the rainy period ends.  The most important indoor molds are Aspergillus and Penicillium.  Dark, humid and poorly ventilated basements are ideal sites for mold growth.  The next most common sites of mold growth are the bathroom and the kitchen.  Outdoor (Seasonal) Mold Control Use air conditioning and keep windows closed Avoid exposure to decaying vegetation. Avoid leaf raking. Avoid grain handling. Consider wearing a face mask if working in moldy areas.   Control of Dust Mite Allergen    Dust mites play a major role in allergic asthma and rhinitis.  They occur in environments with high humidity wherever human skin is found.  Dust mites absorb humidity from the atmosphere (ie, they do not drink) and feed on organic matter (including shed human and animal skin).  Dust mites are a microscopic type of insect that you cannot see with the naked eye.  High levels of dust mites have been detected from mattresses, pillows, carpets, upholstered furniture, bed covers, clothes, soft toys and any woven material.  The principal allergen of the dust mite is found in its feces.  A gram of dust may contain 1,000 mites and 250,000 fecal particles.  Mite antigen is easily measured in the air during house cleaning activities.  Dust mites do not bite and do not cause harm to humans, other than by triggering allergies/asthma.  Ways to decrease your exposure to dust mites in your home:  Encase mattresses, box springs and pillows with a mite-impermeable barrier or cover   Wash sheets, blankets and drapes weekly in hot water (130 F) with detergent and dry them in a dryer on the hot setting.  Have the room cleaned frequently with a vacuum cleaner and a damp dust-mop.  For carpeting or rugs, vacuuming with a vacuum cleaner  equipped with a high-efficiency particulate air (HEPA) filter.  The dust mite allergic individual should not be in a room which is being cleaned and should wait 1 hour after cleaning before going into the room. Do not sleep on upholstered furniture (eg, couches).   If possible removing carpeting, upholstered furniture and drapery from the home is ideal.  Horizontal blinds should be eliminated in the rooms where the person spends the most time (bedroom, study, television room).  Washable vinyl, roller-type shades are optimal. Remove all non-washable stuffed toys from the bedroom.  Wash stuffed toys weekly like sheets and blankets above.   Reduce indoor humidity to less than 50%.  Inexpensive humidity monitors can be purchased at most hardware stores.  Do not use a humidifier as can make the problem worse and are not recommended.

## 2024-06-14 NOTE — Progress Notes (Signed)
 "   Follow-up Note  RE: Alison Zimmerman MRN: [id number] DOB: 1950-11-07 Date of Office Visit: 06/14/2024   History of present illness: Alison Zimmerman is a 74 y.o. female presenting today for skin testing visit.  She was last seen in the office on 05/17/24 for SOB and allergic rhinitis.  She is in her usual state of health today without recent illness.  She has held antihistamines for at least 3 days for testing today.    Medication List: Current Outpatient Medications  Medication Sig Dispense Refill   albuterol  (VENTOLIN  HFA) 108 (90 Base) MCG/ACT inhaler Inhale 2 puffs into the lungs every 4 (four) hours as needed for wheezing or shortness of breath (Cough, difficulty breathing). 18 g 1   CALCIUM  PO Take by mouth.     diphenhydrAMINE-Acetaminophen  (PERCOGESIC PO) Take by mouth. (Patient taking differently: Take by mouth as needed.)     losartan -hydrochlorothiazide (HYZAAR) 100-12.5 MG tablet Take 1 tablet by mouth daily. 90 tablet 2   metFORMIN  (GLUCOPHAGE ) 500 MG tablet Take 1 tablet (500 mg total) by mouth daily with breakfast. 90 tablet 2   NONFORMULARY OR COMPOUNDED ITEM      NONFORMULARY OR COMPOUNDED ITEM Take 1 tablet by mouth daily.     rosuvastatin  (CRESTOR ) 20 MG tablet Take 1 tablet (20 mg total) by mouth daily. 90 tablet 2   Vitamin D , Ergocalciferol , (DRISDOL ) 1.25 MG (50000 UNIT) CAPS capsule Take 1 capsule (50,000 Units total) by mouth every 7 (seven) days. 12 capsule 2   No current facility-administered medications for this visit.     Known medication allergies: Allergies[1]   Diagnostics/Labs:  Allergy  testing:   Airborne Adult Perc - 06/14/24 1355     Time Antigen Placed 1355    Allergen Manufacturer Jestine    Location Back    Number of Test 55    1. Control-Buffer 50% Glycerol Negative    2. Control-Histamine 2+    3. Bahia Negative    4. Bermuda Negative    5. Johnson Negative    6. [place]  Blue Negative    7. Meadow Fescue Negative    8.  Perennial Rye Negative    9. Timothy Negative    10. Ragweed Mix Negative    11. Cocklebur Negative    12. Plantain,  English Negative    13. Baccharis Negative    14. Dog Fennel Negative    15. Russian Thistle Negative    16. Lamb's Quarters Negative    17. Sheep Sorrell Negative    18. Rough Pigweed Negative    19. Marsh Elder, Rough Negative    20. Mugwort, Common Negative    21. Box, Elder Negative    22. Cedar, red Negative    23. Sweet Gum Negative    24. Pecan Pollen Negative    25. Pine Mix Negative    26. Walnut, Black Pollen Negative    27. Red Mulberry Negative    28. Ash Mix 2+    29. Birch Mix Negative    30. Beech American Negative    31. Cottonwood, Eastern 3+    32. Hickory, White Negative    33. Maple Mix 2+    34. Oak, Eastern Mix Negative    35. Sycamore Eastern Negative    36. Alternaria Alternata 2+    37. Cladosporium Herbarum 2+    38. Aspergillus Mix Negative    39. Penicillium Mix Negative    40. Bipolaris Sorokiniana (Helminthosporium) Negative  41. Drechslera Spicifera (Curvularia) Negative    42. Mucor Plumbeus 2+    43. Fusarium Moniliforme Negative    44. Aureobasidium Pullulans (pullulara) Negative    45. Rhizopus Oryzae 2+    46. Botrytis Cinera Negative    47. Epicoccum Nigrum Negative    48. Phoma Betae Negative    49. Dust Mite Mix 2+    50. Cat Hair 10,000 BAU/ml Negative    51.  Dog Epithelia Negative    52. Mixed Feathers Negative    53. Horse Epithelia Negative    54. Cockroach, German Negative    55. Tobacco Leaf Negative          Allergy  testing results were read and interpreted by provider, documented by clinical staff.   Assessment and plan:   Shortness of breath, cough - with allergic component - have access to albuterol  inhaler 2 puffs every 4-6 hours as needed for cough/wheeze/shortness of breath/chest tightness.  May use 15-20 minutes prior to activity.   Monitor frequency of use.    Asthma control goals:   Full participation in all desired activities (may need albuterol  before activity) Albuterol  use two time or less a week on average (not counting use with activity) Cough interfering with sleep two time or less a month Oral steroids no more than once a year No hospitalizations  Allergic rhinitis - Testing today showed: trees, outdoor molds, and dust mites - Copy of test results provided.  - Avoidance measures provided. - If needed for symptom control can take antihistamine Allegra  180 mg daily as needed  Follow-up in 4-6 months   I appreciate the opportunity to take part in Alison Zimmerman's care. Please do not hesitate to contact me with questions.  Sincerely,   Danita Brain, MD Allergy /Immunology [facility]      [1]  Allergies Allergen Reactions   Other     No blood products. Pt is a fish farm manager witness. Honeydew--  hives   "

## 2024-12-20 ENCOUNTER — Ambulatory Visit: Payer: PRIVATE HEALTH INSURANCE | Admitting: Allergy
